# Patient Record
Sex: Male | Born: 1945
Health system: Southern US, Community
[De-identification: ages and names within clinical notes are randomized; demographics above are authoritative.]

## PROBLEM LIST (undated history)

## (undated) DIAGNOSIS — T4145XA Adverse effect of unspecified anesthetic, initial encounter: Secondary | ICD-10-CM

## (undated) DIAGNOSIS — N261 Atrophy of kidney (terminal): Secondary | ICD-10-CM

## (undated) DIAGNOSIS — E785 Hyperlipidemia, unspecified: Secondary | ICD-10-CM

## (undated) DIAGNOSIS — M1990 Unspecified osteoarthritis, unspecified site: Secondary | ICD-10-CM

## (undated) DIAGNOSIS — Z9289 Personal history of other medical treatment: Secondary | ICD-10-CM

## (undated) DIAGNOSIS — I350 Nonrheumatic aortic (valve) stenosis: Secondary | ICD-10-CM

## (undated) DIAGNOSIS — Z9989 Dependence on other enabling machines and devices: Secondary | ICD-10-CM

## (undated) DIAGNOSIS — N186 End stage renal disease: Secondary | ICD-10-CM

## (undated) DIAGNOSIS — I10 Essential (primary) hypertension: Secondary | ICD-10-CM

## (undated) DIAGNOSIS — T8859XA Other complications of anesthesia, initial encounter: Secondary | ICD-10-CM

## (undated) DIAGNOSIS — M869 Osteomyelitis, unspecified: Secondary | ICD-10-CM

## (undated) DIAGNOSIS — N493 Fournier gangrene: Secondary | ICD-10-CM

## (undated) DIAGNOSIS — R011 Cardiac murmur, unspecified: Secondary | ICD-10-CM

## (undated) DIAGNOSIS — G4733 Obstructive sleep apnea (adult) (pediatric): Secondary | ICD-10-CM

## (undated) DIAGNOSIS — I272 Pulmonary hypertension, unspecified: Secondary | ICD-10-CM

## (undated) DIAGNOSIS — N189 Chronic kidney disease, unspecified: Secondary | ICD-10-CM

## (undated) DIAGNOSIS — D649 Anemia, unspecified: Secondary | ICD-10-CM

## (undated) DIAGNOSIS — E559 Vitamin D deficiency, unspecified: Secondary | ICD-10-CM

## (undated) DIAGNOSIS — E669 Obesity, unspecified: Secondary | ICD-10-CM

## (undated) DIAGNOSIS — K922 Gastrointestinal hemorrhage, unspecified: Secondary | ICD-10-CM

## (undated) DIAGNOSIS — E119 Type 2 diabetes mellitus without complications: Secondary | ICD-10-CM

## (undated) DIAGNOSIS — I214 Non-ST elevation (NSTEMI) myocardial infarction: Secondary | ICD-10-CM

## (undated) HISTORY — PX: COLONOSCOPY: SHX174

## (undated) HISTORY — DX: Obstructive sleep apnea (adult) (pediatric): G47.33

## (undated) HISTORY — DX: Chronic kidney disease, unspecified: N18.9

## (undated) HISTORY — DX: Type 2 diabetes mellitus without complications: E11.9

## (undated) HISTORY — DX: Personal history of other medical treatment: Z92.89

## (undated) HISTORY — DX: Fournier gangrene: N49.3

## (undated) HISTORY — DX: Essential (primary) hypertension: I10

## (undated) HISTORY — DX: Other complications of anesthesia, initial encounter: T88.59XA

## (undated) HISTORY — DX: Dependence on other enabling machines and devices: Z99.89

## (undated) HISTORY — DX: Anemia, unspecified: D64.9

## (undated) HISTORY — DX: Vitamin D deficiency, unspecified: E55.9

## (undated) HISTORY — DX: Adverse effect of unspecified anesthetic, initial encounter: T41.45XA

## (undated) HISTORY — PX: COLOSTOMY REVERSAL: SHX5782

## (undated) HISTORY — DX: Hyperlipidemia, unspecified: E78.5

## (undated) HISTORY — DX: Atrophy of kidney (terminal): N26.1

## (undated) HISTORY — DX: Osteomyelitis, unspecified: M86.9

---
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
## 2012-05-22 DIAGNOSIS — E875 Hyperkalemia: Secondary | ICD-10-CM | POA: Insufficient documentation

## 2014-11-15 DIAGNOSIS — M869 Osteomyelitis, unspecified: Secondary | ICD-10-CM

## 2014-11-15 HISTORY — PX: TOE AMPUTATION: SHX809

## 2014-11-15 HISTORY — DX: Osteomyelitis, unspecified: M86.9

## 2015-03-16 HISTORY — PX: COLOSTOMY: SHX63

## 2015-04-08 DIAGNOSIS — E118 Type 2 diabetes mellitus with unspecified complications: Secondary | ICD-10-CM | POA: Insufficient documentation

## 2015-04-08 DIAGNOSIS — I10 Essential (primary) hypertension: Secondary | ICD-10-CM | POA: Insufficient documentation

## 2015-04-08 DIAGNOSIS — E871 Hypo-osmolality and hyponatremia: Secondary | ICD-10-CM | POA: Insufficient documentation

## 2015-04-09 DIAGNOSIS — N184 Chronic kidney disease, stage 4 (severe): Secondary | ICD-10-CM | POA: Insufficient documentation

## 2015-04-13 DIAGNOSIS — N493 Fournier gangrene: Secondary | ICD-10-CM | POA: Insufficient documentation

## 2015-07-01 DIAGNOSIS — M86679 Other chronic osteomyelitis, unspecified ankle and foot: Secondary | ICD-10-CM | POA: Insufficient documentation

## 2016-04-26 DIAGNOSIS — E559 Vitamin D deficiency, unspecified: Secondary | ICD-10-CM | POA: Insufficient documentation

## 2016-09-05 DIAGNOSIS — Z933 Colostomy status: Secondary | ICD-10-CM | POA: Insufficient documentation

## 2018-09-09 DIAGNOSIS — D509 Iron deficiency anemia, unspecified: Secondary | ICD-10-CM | POA: Insufficient documentation

## 2018-12-18 DIAGNOSIS — I350 Nonrheumatic aortic (valve) stenosis: Secondary | ICD-10-CM | POA: Insufficient documentation

## 2019-02-28 ENCOUNTER — Ambulatory Visit (INDEPENDENT_AMBULATORY_CARE_PROVIDER_SITE_OTHER): Payer: Medicare Other | Admitting: Internal Medicine

## 2019-02-28 ENCOUNTER — Other Ambulatory Visit: Payer: Self-pay

## 2019-02-28 ENCOUNTER — Encounter: Payer: Self-pay | Admitting: Internal Medicine

## 2019-02-28 DIAGNOSIS — N184 Chronic kidney disease, stage 4 (severe): Secondary | ICD-10-CM | POA: Diagnosis not present

## 2019-02-28 DIAGNOSIS — I350 Nonrheumatic aortic (valve) stenosis: Secondary | ICD-10-CM | POA: Diagnosis not present

## 2019-02-28 DIAGNOSIS — E118 Type 2 diabetes mellitus with unspecified complications: Secondary | ICD-10-CM

## 2019-02-28 DIAGNOSIS — I10 Essential (primary) hypertension: Secondary | ICD-10-CM

## 2019-02-28 DIAGNOSIS — E875 Hyperkalemia: Secondary | ICD-10-CM

## 2019-02-28 DIAGNOSIS — E559 Vitamin D deficiency, unspecified: Secondary | ICD-10-CM

## 2019-02-28 NOTE — Progress Notes (Signed)
Subjective:    Patient ID: Louis Bradley, male    DOB: September 08, 1946, 73 y.o.   MRN: [id number]  DOS:  02/28/2019 Type of visit - description: Virtual Visit via Video Note  I connected with@ on 03/01/19 at  2:00 PM EDT by a video enabled telemedicine application and verified that I am speaking with the correct person using two identifiers.   THIS ENCOUNTER IS A VIRTUAL VISIT DUE TO COVID-19 - PATIENT WAS NOT SEEN IN THE OFFICE. PATIENT HAS CONSENTED TO VIRTUAL VISIT / TELEMEDICINE VISIT   Location of patient: home  Location of provider: office  I discussed the limitations of evaluation and management by telemedicine and the availability of in person appointments. The patient expressed understanding and agreed to proceed.  History of Present Illness: New patient In general feels well.  Good compliance with medication. Reviewed the notes from cardiology, hematology, nephrology Reviewed available labs  Review of Systems Denies any fever chills No chest pain no difficulty breathing No nausea, vomiting, diarrhea. No lower extremity edema  Past Medical History:  Diagnosis Date  . Anemia   . Anesthesia complication    General anesthesia makes me feel foggy for long periods of time  . CKD (chronic kidney disease)   . Diabetes ([place])   . Fournier gangrene   . History of blood transfusion    x2  . Hyperlipidemia   . Hypertension   . OSA on CPAP   . Osteomyelitis ([place]) 2016   L toe  . Renal atrophy, left   . Vitamin D deficiency     Past Surgical History:  Procedure Laterality Date  . COLOSTOMY  03/2015  . TOE AMPUTATION Left 2016    Social History   Socioeconomic History  . Marital status: Married    Spouse name: Not on file  . Number of children: 2  . Years of education: Not on file  . Highest education level: Not on file  Occupational History  . Occupation: retired-owned a Environmental manager   . Occupation: retired- Theatre manager  Social Needs  . Financial resource  strain: Not on file  . Food insecurity:    Worry: Not on file    Inability: Not on file  . Transportation needs:    Medical: Not on file    Non-medical: Not on file  Tobacco Use  . Smoking status: Former Research scientist (life sciences)  . Smokeless tobacco: Never Used  . Tobacco comment: quit 12/2018 (1 ppd)  Substance and Sexual Activity  . Alcohol use: Yes    Comment: social  . Drug use: Not on file  . Sexual activity: Not on file  Lifestyle  . Physical activity:    Days per week: Not on file    Minutes per session: Not on file  . Stress: Not on file  Relationships  . Social connections:    Talks on phone: Not on file    Gets together: Not on file    Attends religious service: Not on file    Active member of club or organization: Not on file    Attends meetings of clubs or organizations: Not on file    Relationship status: Not on file  . Intimate partner violence:    Fear of current or ex partner: Not on file    Emotionally abused: Not on file    Physically abused: Not on file    Forced sexual activity: Not on file  Other Topics Concern  . Not on file  Social History Narrative  Patient and his wife moved from [place] on 02/14/2021 live with his daughter     Family History  Problem Relation Age of Onset  . CAD Father 52  . CAD Other        2 uncles had heart attacks  . Colon cancer Neg Hx   . Prostate cancer Neg Hx      Allergies as of 02/28/2019   No Known Allergies     Medication List       Accurate as of February 28, 2019 11:59 PM. Always use your most recent med list.        amLODipine 10 MG tablet Commonly known as:  NORVASC Take 10 mg by mouth daily.   aspirin 325 MG tablet Take 325 mg by mouth daily.   carvedilol 25 MG tablet Commonly known as:  COREG Take 25 mg by mouth 2 (two) times daily with a meal.   furosemide 40 MG tablet Commonly known as:  LASIX Take 80 mg by mouth daily.   glipiZIDE 5 MG tablet Commonly known as:  GLUCOTROL Take 10 mg by mouth 2 (two)  times daily.   pravastatin 40 MG tablet Commonly known as:  PRAVACHOL Take 40 mg by mouth at bedtime.   PROBIOTIC DAILY PO Take 1 tablet by mouth daily.           Objective:   Physical Exam There were no vitals taken for this visit. This is a video visit, the patient was alert oriented x3, no apparent distress    Assessment    Assessment (new patient/2020, referred by his daughter) DM HTN Hyperlipidemia CKD: HD fistula planned ~ 11/2018, cancelled Vitamin D deficiency Aortic valve stenosis: Next echo 2020 OSA, on CPAP H/o osteomyelitis status post left toe amputation H/o gangrene, buttocks, had temporarily a colostomy Former smoker, quit 12-2018 Anemia: Seen at the ER with fatigue, hemoglobin of 8.6 08-2018: 2 PRBCs, had a EGD and colonoscopy   PLAN: DM: Currently on Glucotrol, ambulatory CBGs nightly, 120 in the mornings.  A1c 5.9 on 09/09/2018. We will check a A1c at the next opportunity HTN: Currently on amlodipine, carvedilol, Lasix.  Ambulatory BPs 120, 140.  Diastolic BPs 87O.  Labs at the next opportunity CKD: Hemodialysis fistula was planned for 11-2018 but eventually cancel Last office visit with nephrology 12/21/2018.  They felt the patient was a stable with no uremic symptoms. Labs from 11/30/2018: Sodium 136, potassium 5.7, creatinine 3.8, hemoglobin 13.2. Labs from 12/22/2018: Sodium 133, potassium 5.3, creatinine 3.3. Calcium 9.5, phosphorus 6.4, Vitamin D 18 Hemoglobin 12.6, iron levels normal Plan: Refer to our local nephrologist. In the meantime, we will check a BMP next week to be sure he has no hyperkalemia OSA: Reports good compliance with CPAP Coronavirus prevention: The patient and his wife are practicing excellent hygiene as recommended by the CDC Aortic valve stenosis: Last cardiology visit 12/18/2018, described as moderate, will need a due for an echocardiogram this year 2020. We will refer to cardiology when he comes back. Severe anemia: Was seen  at the emergency room 09/08/2018 with fatigue, hemoglobin was 6.8, GI ROS negative, had no previous colonoscopy or EGD.  Had 2 PRBCs, 3 doses of Injectafer Had a EGD and colonoscopy December 2019, per patient for polyps, repeat in 5 years. Return to the office: 6 weeks, my staff will call and set up an appointment, in the meantime he will call if he has problems or needs a refill.   Today, I spent more than  35 min with the patient: >50% of the time counseling regards his multiple medical problems, doing extensive chart review to get familiar with the case.  Also coordinating his care.   I discussed the assessment and treatment plan with the patient. The patient was provided an opportunity to ask questions and all were answered. The patient agreed with the plan and demonstrated an understanding of the instructions.   The patient was advised to call back or seek an in-person evaluation if the symptoms worsen or if the condition fails to improve as anticipated.

## 2019-03-01 DIAGNOSIS — Z09 Encounter for follow-up examination after completed treatment for conditions other than malignant neoplasm: Secondary | ICD-10-CM | POA: Insufficient documentation

## 2019-03-01 NOTE — Assessment & Plan Note (Signed)
  DM: Currently on Glucotrol, ambulatory CBGs nightly, 120 in the mornings.  A1c 5.9 on 09/09/2018. We will check a A1c at the next opportunity HTN: Currently on amlodipine, carvedilol, Lasix.  Ambulatory BPs 120, 140.  Diastolic BPs 54H.  Labs at the next opportunity CKD: Hemodialysis fistula was planned for 11-2018 but eventually cancel Last office visit with nephrology 12/21/2018.  They felt the patient was a stable with no uremic symptoms. Labs from 11/30/2018: Sodium 136, potassium 5.7, creatinine 3.8, hemoglobin 13.2. Labs from 12/22/2018: Sodium 133, potassium 5.3, creatinine 3.3. Calcium 9.5, phosphorus 6.4, Vitamin D 18 Hemoglobin 12.6, iron levels normal Plan: Refer to our local nephrologist. In the meantime, we will check a BMP next week to be sure he has no hyperkalemia OSA: Reports good compliance with CPAP Coronavirus prevention: The patient and his wife are practicing excellent hygiene as recommended by the CDC Aortic valve stenosis: Last cardiology visit 12/18/2018, described as moderate, will need a due for an echocardiogram this year 2020. We will refer to cardiology when he comes back. Severe anemia: Was seen at the emergency room 09/08/2018 with fatigue, hemoglobin was 6.8, GI ROS negative, had no previous colonoscopy or EGD.  Had 2 PRBCs, 3 doses of Injectafer Had a EGD and colonoscopy December 2019, per patient for polyps, repeat in 5 years. Return to the office: 6 weeks, my staff will call and set up an appointment, in the meantime he will call if he has problems or needs a refill.

## 2019-03-02 NOTE — Addendum Note (Signed)
Addended by: Hinton Dyer on: 03/02/2019 08:11 AM   Modules accepted: Orders

## 2019-03-02 NOTE — Addendum Note (Signed)
Addended by: Hinton Dyer on: 03/02/2019 08:02 AM   Modules accepted: Orders

## 2019-03-07 ENCOUNTER — Other Ambulatory Visit: Payer: Self-pay | Admitting: Internal Medicine

## 2019-03-07 ENCOUNTER — Other Ambulatory Visit: Payer: Self-pay

## 2019-03-07 ENCOUNTER — Telehealth: Payer: Self-pay | Admitting: Internal Medicine

## 2019-03-07 ENCOUNTER — Other Ambulatory Visit (INDEPENDENT_AMBULATORY_CARE_PROVIDER_SITE_OTHER): Payer: Medicare Other

## 2019-03-07 DIAGNOSIS — E875 Hyperkalemia: Secondary | ICD-10-CM

## 2019-03-07 DIAGNOSIS — N186 End stage renal disease: Secondary | ICD-10-CM

## 2019-03-07 DIAGNOSIS — N184 Chronic kidney disease, stage 4 (severe): Secondary | ICD-10-CM

## 2019-03-07 LAB — BASIC METABOLIC PANEL
BUN: 83 mg/dL — ABNORMAL HIGH (ref 6–23)
CO2: 24 mEq/L (ref 19–32)
Calcium: 8.4 mg/dL (ref 8.4–10.5)
Chloride: 104 mEq/L (ref 96–112)
Creatinine, Ser: 4.5 mg/dL (ref 0.40–1.50)
GFR: 12.9 mL/min — CL (ref >60.00)
Glucose, Bld: 133 mg/dL — ABNORMAL HIGH (ref 70–99)
Potassium: 5.7 mEq/L — ABNORMAL HIGH (ref 3.5–5.1)
Sodium: 137 mEq/L (ref 135–145)

## 2019-03-07 MED ORDER — SODIUM ZIRCONIUM CYCLOSILICATE 10 G PO PACK: 10.0000 g | PACK | Freq: Every day | ORAL | 0 refills | Status: DC

## 2019-03-07 NOTE — Telephone Encounter (Signed)
Louis Medina, RN with Access Nurse called to report critical lab results from [facility]: Creatinine 4.5 Normal 0.4-1.5 GFR 12.90, Normal >60  She asks will I give the results to the doctor, I advise he office is closed and the results will be sent to the office for the provider to receive tomorrow. She says she has to report it to a doctor and says the on call provider Dr. Sharlet Salina doesn't go on until 1700, so she will make a note she spoke to me and will call Dr. Sharlet Salina at 1700 to give her the critical as well.

## 2019-03-07 NOTE — Telephone Encounter (Signed)
Called patient and left message to take extra 40 mg tablet lasix tonight for critical lab with K 5.7. Advised also to come back to lab tomorrow for recheck. BMP placed for tomorrow.

## 2019-03-08 NOTE — Telephone Encounter (Signed)
Noted, see comments under results

## 2019-03-08 NOTE — Addendum Note (Signed)
Addended by: Hinton Dyer on: 03/08/2019 09:39 AM   Modules accepted: Orders

## 2019-03-12 ENCOUNTER — Telehealth: Payer: Self-pay

## 2019-03-12 NOTE — Telephone Encounter (Signed)
Tier exception form for [facility] received from [facility] for Pt- form completed and faxed to [phone]. Awaiting determination.

## 2019-03-12 NOTE — Telephone Encounter (Signed)
Tier exception denied. [facility] not eligible for tier reduction.

## 2019-03-13 ENCOUNTER — Other Ambulatory Visit: Payer: Self-pay

## 2019-03-13 MED ORDER — FUROSEMIDE 40 MG PO TABS: 80.0000 mg | ORAL_TABLET | Freq: Every day | ORAL | 1 refills | Status: DC

## 2019-03-13 MED ORDER — GLIPIZIDE 5 MG PO TABS: 10.0000 mg | ORAL_TABLET | Freq: Two times a day (BID) | ORAL | 1 refills | Status: DC

## 2019-03-13 MED ORDER — PRAVASTATIN SODIUM 40 MG PO TABS: 40.0000 mg | ORAL_TABLET | Freq: Every day | ORAL | 1 refills | Status: DC

## 2019-03-13 MED ORDER — AMLODIPINE BESYLATE 10 MG PO TABS: 10.0000 mg | ORAL_TABLET | Freq: Every day | ORAL | 1 refills | Status: DC

## 2019-03-13 MED ORDER — CARVEDILOL 25 MG PO TABS: 25.0000 mg | ORAL_TABLET | Freq: Two times a day (BID) | ORAL | 1 refills | Status: DC

## 2019-03-13 NOTE — Telephone Encounter (Addendum)
I am concerned about the patient not able to get [facility], call at his insurance company, "[employer]", I specifically told the staff member that he has hyperkalemia and this is potentially a life-threatening condition. Later on the call,  I was informed that he has a deductible to meet, after that cost would be approximately $47.  There is nothing they can do about it. I called [facility] to talk with the referral coordinator as patient needs to be seen ASAP.  No answer Called the patient: No answer

## 2019-03-13 NOTE — Telephone Encounter (Signed)
Per Herbie [place] at [facility] has a deductible he must meet, then [facility] is $47.00. Pt can call them to see if he qualifies for assistance at [phone] option 2.

## 2019-03-13 NOTE — Telephone Encounter (Signed)
Refill request received from [facility] for glipizide, amlodipine, pravastatin, furosemide, and carvedilol. Prescriptions sent for 90 day supplies and 1 refill.

## 2019-03-13 NOTE — Telephone Encounter (Signed)
I talked with Louis Bradley, he has an appointment 03/19/2019 at 2:30 PM. Also, patient was able to get [facility] and is taking it daily. Advised patient that he does not need to come next week for labs since he is going to be seen by nephrology.  He will call me when needed.

## 2019-03-19 LAB — BASIC METABOLIC PANEL
BUN: 59 — AB (ref 4–21)
Creatinine: 4 — AB (ref 0.6–1.3)
Glucose: 88
Potassium: 5.1 (ref 3.4–5.3)
Sodium: 136 — AB (ref 137–147)

## 2019-03-19 LAB — IRON,TIBC AND FERRITIN PANEL
%SAT: 26
Ferritin: 224
Iron: 74
TIBC: 289
UIBC: 215

## 2019-03-19 LAB — CBC AND DIFFERENTIAL
HCT: 35 — AB (ref 41–53)
Hemoglobin: 11.5 — AB (ref 13.5–17.5)
Neutrophils Absolute: 6
Platelets: 173 (ref 150–399)
WBC: 8.7

## 2019-03-20 ENCOUNTER — Other Ambulatory Visit: Payer: Medicare Other

## 2019-03-26 ENCOUNTER — Encounter: Payer: Self-pay | Admitting: Internal Medicine

## 2019-04-02 ENCOUNTER — Other Ambulatory Visit: Payer: Self-pay

## 2019-04-02 DIAGNOSIS — N186 End stage renal disease: Secondary | ICD-10-CM

## 2019-04-03 ENCOUNTER — Telehealth (HOSPITAL_COMMUNITY): Payer: Self-pay | Admitting: Rehabilitation

## 2019-04-03 NOTE — Telephone Encounter (Signed)
The above patient or their representative was contacted and gave the following answers to these questions:         Do you have any of the following symptoms? No  Fever                    Cough                   Shortness of breath  Do  you have any of the following other symptoms? No   muscle pain         vomiting,        diarrhea        rash         weakness        red eye        abdominal pain         bruising          bruising or bleeding              joint pain           severe headache    Have you been in contact with someone who was or has been sick in the past 2 weeks? No  Yes                 Unsure                         Unable to assess   Does the person that you were in contact with have any of the following symptoms?   Cough         shortness of breath           muscle pain         vomiting,            diarrhea            rash            weakness           fever            red eye           abdominal pain           bruising  or  bleeding                joint pain                severe headache               Have you  or someone you have been in contact with traveled internationally in th last month? No        If yes, which countries?   Have you  or someone you have been in contact with traveled outside [place] in th last month?  No       If yes, which state and city?   COMMENTS OR ACTION PLAN FOR THIS PATIENT:  Pt instructed to wear mask to appt and come alone.

## 2019-04-03 NOTE — Telephone Encounter (Signed)
Patient's wife informed me that they did recently move to [place] from a small town close to [place], [place] approximately one month ago. All screening questions were answered with "No".

## 2019-04-04 ENCOUNTER — Encounter: Payer: Self-pay | Admitting: Vascular Surgery

## 2019-04-04 ENCOUNTER — Ambulatory Visit (HOSPITAL_COMMUNITY)
Admission: RE | Admit: 2019-04-04 | Discharge: 2019-04-04 | Disposition: A | Discharge status: Home / Self Care | Payer: Medicare Other | Source: Ambulatory Visit | Attending: Vascular Surgery | Admitting: Vascular Surgery

## 2019-04-04 ENCOUNTER — Ambulatory Visit (INDEPENDENT_AMBULATORY_CARE_PROVIDER_SITE_OTHER): Payer: Medicare Other | Admitting: Vascular Surgery

## 2019-04-04 ENCOUNTER — Ambulatory Visit (INDEPENDENT_AMBULATORY_CARE_PROVIDER_SITE_OTHER)
Admission: RE | Admit: 2019-04-04 | Discharge: 2019-04-04 | Disposition: A | Discharge status: Home / Self Care | Payer: Medicare Other | Source: Ambulatory Visit | Attending: Vascular Surgery | Admitting: Vascular Surgery

## 2019-04-04 ENCOUNTER — Encounter: Payer: Self-pay | Admitting: *Deleted

## 2019-04-04 ENCOUNTER — Other Ambulatory Visit: Payer: Self-pay

## 2019-04-04 ENCOUNTER — Other Ambulatory Visit: Payer: Self-pay | Admitting: *Deleted

## 2019-04-04 VITALS — BP 140/71 | HR 55 | Temp 97.2°F | Resp 20 | Ht 75.0 in | Wt 307.0 lb

## 2019-04-04 DIAGNOSIS — N186 End stage renal disease: Secondary | ICD-10-CM | POA: Diagnosis not present

## 2019-04-04 DIAGNOSIS — N185 Chronic kidney disease, stage 5: Secondary | ICD-10-CM

## 2019-04-04 NOTE — Progress Notes (Signed)
REASON FOR CONSULT:    To evaluate for hemodialysis access.  The consult is requested by [facility]  ASSESSMENT & PLAN:   STAGE V CHRONIC KIDNEY DISEASE: This patient has stage V chronic kidney disease and we have have been asked to place hemodialysis access.  Based on his vein map looks like he might be a candidate for a left upper arm brachiocephalic fistula or potentially a basilic vein transposition.  I have explained that if we do a basilic vein transposition this could potentially be done in 1 or 2 stages depending upon the size of the vein.  If the vein is small and we oftentimes do it in 2 stages.  If the vein looks adequate we will sometimes do it all at once.  If neither were adequate then he would require placement of an AV graft.  I have explained the indications for placement of an AV fistula or AV graft. I've explained that if at all possible we will place an AV fistula.  I have reviewed the risks of placement of an AV fistula including but not limited to: failure of the fistula to mature, need for subsequent interventions, and thrombosis. In addition I have reviewed the potential complications of placement of an AV graft. These risks include, but are not limited to, graft thrombosis, graft infection, wound healing problems, bleeding, arm swelling, and steal syndrome. All the patient's questions were answered and they are agreeable to proceed with surgery.  Deitra Mayo, MD, FACS Beeper [phone] Office: [phone]   HPI:   Louis Bradley is a pleasant 73 y.o. male, who recently moved here from [place].  He has stage V chronic kidney disease.  He was apparently scheduled to have surgery there but his appointment was canceled because the surgeon had an emergency.  Things got delayed and ultimately he decided to reschedule once he got to [place].  The patient has a single kidney apparently since birth.  He is not sure of the etiology of his chronic kidney  disease.  I do not have the records from the referring office.  The patient denies any recent uremic symptoms.  Specifically, he denies nausea, vomiting, fatigue, anorexia, or palpitations.  He does have type 2 diabetes and hypertension.  He denies any history of myocardial infarction or history of congestive heart failure.  He has a remote history of tobacco use.  He is right-handed.  He has not had a pacemaker or AICD.  He has had no previous catheters.  Past Medical History:  Diagnosis Date  . Anemia   . Anesthesia complication    General anesthesia makes me feel foggy for long periods of time  . CKD (chronic kidney disease)   . Diabetes ([place])   . Fournier gangrene   . History of blood transfusion    x2  . Hyperlipidemia   . Hypertension   . OSA on CPAP   . Osteomyelitis ([place]) 2016   L toe  . Renal atrophy, left   . Vitamin D deficiency     Family History  Problem Relation Age of Onset  . CAD Father 81  . CAD Other        2 uncles had heart attacks  . Colon cancer Neg Hx   . Prostate cancer Neg Hx     SOCIAL HISTORY: Social History   Socioeconomic History  . Marital status: Married    Spouse name: Not on file  . Number of children: 2  .  Years of education: Not on file  . Highest education level: Not on file  Occupational History  . Occupation: retired-owned a Environmental manager   . Occupation: retired- Theatre manager  Social Needs  . Financial resource strain: Not on file  . Food insecurity:    Worry: Not on file    Inability: Not on file  . Transportation needs:    Medical: Not on file    Non-medical: Not on file  Tobacco Use  . Smoking status: Former Research scientist (life sciences)  . Smokeless tobacco: Never Used  . Tobacco comment: quit 12/2018 (1 ppd)  Substance and Sexual Activity  . Alcohol use: Yes    Comment: social  . Drug use: Never  . Sexual activity: Not on file  Lifestyle  . Physical activity:    Days per week: Not on file    Minutes per session: Not on file  .  Stress: Not on file  Relationships  . Social connections:    Talks on phone: Not on file    Gets together: Not on file    Attends religious service: Not on file    Active member of club or organization: Not on file    Attends meetings of clubs or organizations: Not on file    Relationship status: Not on file  . Intimate partner violence:    Fear of current or ex partner: Not on file    Emotionally abused: Not on file    Physically abused: Not on file    Forced sexual activity: Not on file  Other Topics Concern  . Not on file  Social History Narrative   Patient and his wife moved from [place] on 02/14/2021 live with his daughter    No Known Allergies  Current Outpatient Medications  Medication Sig Dispense Refill  . amLODipine (NORVASC) 10 MG tablet Take 1 tablet (10 mg total) by mouth daily. 90 tablet 1  . aspirin 325 MG tablet Take 325 mg by mouth daily.    . calcitRIOL (ROCALTROL) 0.25 MCG capsule TAKE 1 CAPSULE BY MOUTH THREE TIMES A WEEK    . carvedilol (COREG) 25 MG tablet Take 1 tablet (25 mg total) by mouth 2 (two) times daily with a meal. 180 tablet 1  . furosemide (LASIX) 40 MG tablet Take 2 tablets (80 mg total) by mouth daily. 180 tablet 1  . glipiZIDE (GLUCOTROL) 5 MG tablet Take 2 tablets (10 mg total) by mouth 2 (two) times daily. 180 tablet 1  . pravastatin (PRAVACHOL) 40 MG tablet Take 1 tablet (40 mg total) by mouth at bedtime. 90 tablet 1  . Probiotic Product (PROBIOTIC DAILY PO) Take 1 tablet by mouth daily.    . sodium bicarbonate 650 MG tablet Take 650 mg by mouth daily.    . sodium zirconium cyclosilicate (LOKELMA) 10 g PACK packet Take 10 g by mouth daily. 30 packet 0   No current facility-administered medications for this visit.     REVIEW OF SYSTEMS:  [X]  denotes positive finding, [ ]  denotes negative finding Cardiac  Comments:  Chest pain or chest pressure:    Shortness of breath upon exertion:    Short of breath when lying flat:    Irregular heart  rhythm:        Vascular    Pain in calf, thigh, or hip brought on by ambulation:    Pain in feet at night that wakes you up from your sleep:     Blood clot in your veins:  Leg swelling:         Pulmonary    Oxygen at home:    Productive cough:     Wheezing:         Neurologic    Sudden weakness in arms or legs:     Sudden numbness in arms or legs:     Sudden onset of difficulty speaking or slurred speech:    Temporary loss of vision in one eye:     Problems with dizziness:         Gastrointestinal    Blood in stool:     Vomited blood:         Genitourinary    Burning when urinating:     Blood in urine:        Psychiatric    Major depression:         Hematologic    Bleeding problems:    Problems with blood clotting too easily:        Skin    Rashes or ulcers:        Constitutional    Fever or chills:     PHYSICAL EXAM:   Vitals:   04/04/19 1318  BP: 140/71  Pulse: (!) 55  Resp: 20  Temp: (!) 97.2 F (36.2 C)  SpO2: 95%  Weight: (!) 307 lb (139.3 kg)  Height: 6\' 3"  (1.905 m)   GENERAL: The patient is a well-nourished male, in no acute distress. The vital signs are documented above. CARDIAC: There is a regular rate and rhythm.  VASCULAR: I do not detect carotid bruits. He has palpable radial pulses bilaterally. I do not see any usable surface veins on either side. PULMONARY: There is good air exchange bilaterally without wheezing or rales. ABDOMEN: Soft and non-tender with normal pitched bowel sounds.  MUSCULOSKELETAL: There are no major deformities or cyanosis. NEUROLOGIC: No focal weakness or paresthesias are detected. SKIN: There are no ulcers or rashes noted. PSYCHIATRIC: The patient has a normal affect.  DATA:    LABS: I reviewed his labs from 03/19/2019.  He had a creatinine of 4.0.  His GFR was 13.  UPPER EXTREMITY ARTERIAL DUPLEX: I have independently interpreted his upper extremity arterial duplex scan today.  On the right side there is a  triphasic radial and ulnar Doppler signal.  The brachial artery measures 0.63 cm in diameter.  On the left side there is a triphasic radial and ulnar Doppler signal.  The brachial artery measures 0.58 cm in diameter.  UPPER EXTREMITY VEIN MAP: I have independently interpreted their upper extremity vein map.  On the left side, the forearm cephalic vein looks small.  The upper arm cephalic vein is somewhat marginal in size but might potentially be usable.  The basilic vein looks reasonable in size.

## 2019-04-12 ENCOUNTER — Ambulatory Visit (HOSPITAL_BASED_OUTPATIENT_CLINIC_OR_DEPARTMENT_OTHER)
Admission: RE | Admit: 2019-04-12 | Discharge: 2019-04-12 | Disposition: A | Discharge status: Home / Self Care | Payer: Medicare Other | Source: Ambulatory Visit | Attending: Internal Medicine | Admitting: Internal Medicine

## 2019-04-12 ENCOUNTER — Ambulatory Visit (INDEPENDENT_AMBULATORY_CARE_PROVIDER_SITE_OTHER): Payer: Medicare Other | Admitting: Internal Medicine

## 2019-04-12 ENCOUNTER — Other Ambulatory Visit: Payer: Self-pay

## 2019-04-12 ENCOUNTER — Encounter: Payer: Self-pay | Admitting: Internal Medicine

## 2019-04-12 ENCOUNTER — Telehealth: Payer: Self-pay

## 2019-04-12 DIAGNOSIS — M869 Osteomyelitis, unspecified: Secondary | ICD-10-CM

## 2019-04-12 DIAGNOSIS — Z8614 Personal history of Methicillin resistant Staphylococcus aureus infection: Secondary | ICD-10-CM | POA: Diagnosis present

## 2019-04-12 DIAGNOSIS — E118 Type 2 diabetes mellitus with unspecified complications: Secondary | ICD-10-CM | POA: Diagnosis present

## 2019-04-12 DIAGNOSIS — L089 Local infection of the skin and subcutaneous tissue, unspecified: Secondary | ICD-10-CM | POA: Diagnosis not present

## 2019-04-12 MED ORDER — DOXYCYCLINE HYCLATE 100 MG PO TABS: 100.0000 mg | ORAL_TABLET | Freq: Two times a day (BID) | ORAL | 0 refills | Status: DC

## 2019-04-12 MED ORDER — MUPIROCIN 2 % EX OINT: 1.0000 "application " | TOPICAL_OINTMENT | Freq: Two times a day (BID) | CUTANEOUS | 0 refills | Status: DC

## 2019-04-12 NOTE — Telephone Encounter (Signed)
Copied from [place] [id number]. Topic: General - Other >> Apr 12, 2019  9:02 AM Keene Breath wrote: Reason for CRM: Patient called to inform the doctor that his kidney doctor told the patient that it is ok to take doxycycline 100 mg 2x daily.  Patient stated that Dr. Larose Kells wanted his to check before he started taking it.  Please advise and call patient back at [phone]

## 2019-04-12 NOTE — Progress Notes (Signed)
Subjective:    Patient ID: Louis Bradley, male    DOB: 02-20-1946, 73 y.o.   MRN: [id number]  DOS:  04/12/2019 Type of visit - description: Virtual Visit via Video Note  I connected with@ on 04/12/19 at  8:40 AM EDT by a video enabled telemedicine application and verified that I am speaking with the correct person using two identifiers.   THIS ENCOUNTER IS A VIRTUAL VISIT DUE TO COVID-19 - PATIENT WAS NOT SEEN IN THE OFFICE. PATIENT HAS CONSENTED TO VIRTUAL VISIT / TELEMEDICINE VISIT   Location of patient: home  Location of provider: office  I discussed the limitations of evaluation and management by telemedicine and the availability of in person appointments. The patient expressed understanding and agreed to proceed.  History of Present Illness: Acute visit, here with his wife who helps with history taking Noted 2 sores on the L toes yesterday, does not know  when they happen. Toes are swollen, slightly red and warm.  No discharge.  No pain.  He does not recall blisters there. Medication list reviewed, good compliance     Review of Systems No fever chills  Past Medical History:  Diagnosis Date  . Anemia   . Anesthesia complication    General anesthesia makes me feel foggy for long periods of time  . CKD (chronic kidney disease)   . Diabetes ([place])   . Fournier gangrene   . History of blood transfusion    x2  . Hyperlipidemia   . Hypertension   . OSA on CPAP   . Osteomyelitis ([employer]) 2016   L toe  . Renal atrophy, left   . Vitamin D deficiency     Past Surgical History:  Procedure Laterality Date  . COLOSTOMY  03/2015  . TOE AMPUTATION Left 2016    Social History   Socioeconomic History  . Marital status: Married    Spouse name: Not on file  . Number of children: 2  . Years of education: Not on file  . Highest education level: Not on file  Occupational History  . Occupation: retired-owned a Environmental manager   . Occupation: retired- Theatre manager  Social Needs   . Financial resource strain: Not on file  . Food insecurity:    Worry: Not on file    Inability: Not on file  . Transportation needs:    Medical: Not on file    Non-medical: Not on file  Tobacco Use  . Smoking status: Former Research scientist (life sciences)  . Smokeless tobacco: Never Used  . Tobacco comment: quit 12/2018 (1 ppd)  Substance and Sexual Activity  . Alcohol use: Yes    Comment: social  . Drug use: Never  . Sexual activity: Not on file  Lifestyle  . Physical activity:    Days per week: Not on file    Minutes per session: Not on file  . Stress: Not on file  Relationships  . Social connections:    Talks on phone: Not on file    Gets together: Not on file    Attends religious service: Not on file    Active member of club or organization: Not on file    Attends meetings of clubs or organizations: Not on file    Relationship status: Not on file  . Intimate partner violence:    Fear of current or ex partner: Not on file    Emotionally abused: Not on file    Physically abused: Not on file    Forced sexual activity:  Not on file  Other Topics Concern  . Not on file  Social History Narrative   Patient and his wife moved from [place] on 02/14/2021 live with his daughter      Allergies as of 04/12/2019   No Known Allergies     Medication List       Accurate as of Apr 12, 2019  8:39 AM. If you have any questions, ask your nurse or doctor.        amLODipine 10 MG tablet Commonly known as:  NORVASC Take 1 tablet (10 mg total) by mouth daily.   aspirin 325 MG tablet Take 325 mg by mouth daily.   calcitRIOL 0.25 MCG capsule Commonly known as:  ROCALTROL TAKE 1 CAPSULE BY MOUTH THREE TIMES A WEEK   carvedilol 25 MG tablet Commonly known as:  COREG Take 1 tablet (25 mg total) by mouth 2 (two) times daily with a meal.   furosemide 40 MG tablet Commonly known as:  LASIX Take 2 tablets (80 mg total) by mouth daily.   glipiZIDE 5 MG tablet Commonly known as:  GLUCOTROL Take 2 tablets  (10 mg total) by mouth 2 (two) times daily.   pravastatin 40 MG tablet Commonly known as:  PRAVACHOL Take 1 tablet (40 mg total) by mouth at bedtime.   PROBIOTIC DAILY PO Take 1 tablet by mouth daily.   sodium bicarbonate 650 MG tablet Take 650 mg by mouth daily.   sodium zirconium cyclosilicate 10 g Pack packet Commonly known as:  Lokelma Take 10 g by mouth daily.           Objective:   Physical Exam There were no vitals taken for this visit. This is a virtual video visit.  Patient is alert oriented x3, no apparent distress I was able to see the plantar side of the second and third toes on the left, distally he has superficial wounds.  Mild swelling and minimal redness noted on the second toe. He has a surgical absent left big toe.    Assessment     Assessment (new patient/2020, referred by his daughter) DM HTN Hyperlipidemia CKD: HD fistula planned ~ 11/2018, cancelled Vitamin D deficiency Aortic valve stenosis: Next echo 2020 OSA, on CPAP H/o osteomyelitis status post left toe amputation H/o gangrene, buttocks, had temporarily a colostomy Former smoker, quit 12-2018 Anemia: Seen at the ER with fatigue, hemoglobin of 8.6 08-2018: 2 PRBCs, had a EGD and colonoscopy   PLAN: Toe infection x2: The patient is diabetic, has CKD, I have never been able to examine the patient, this is a virtual visit.  Suspect he probably have some degree of PVD. Plan: Doxycycline x10 days, mupirocin, avoid pressure in the area, wound care center referral, x-ray Addendum: X-ray show changes consistent with osteomyelitis, see report.  Cancel wound care center surgery referral and referred to Ortho, Dr. Sharol Given.  This was discussed with the patient. Follow-up in 2 weeks, will arrange   Time spent > 25 min  I discussed the assessment and treatment plan with the patient. The patient was provided an opportunity to ask questions and all were answered. The patient agreed with the plan and  demonstrated an understanding of the instructions.   The patient was advised to call back or seek an in-person evaluation if the symptoms worsen or if the condition fails to improve as anticipated.

## 2019-04-12 NOTE — Telephone Encounter (Signed)
Prescription sent, proceed with antibiotics

## 2019-04-13 NOTE — Assessment & Plan Note (Signed)
Toe infection x2: The patient is diabetic, has CKD, I have never been able to examine the patient, this is a virtual visit.  Suspect he probably have some degree of PVD. Plan: Doxycycline x10 days, mupirocin, avoid pressure in the area, wound care center referral, x-ray Addendum: X-ray show changes consistent with osteomyelitis, see report.  Cancel wound care center surgery referral and referred to Ortho, Dr. Sharol Given.  This was discussed with the patient. Follow-up in 2 weeks, will arrange

## 2019-04-16 ENCOUNTER — Encounter: Payer: Self-pay | Admitting: Family Medicine

## 2019-04-16 ENCOUNTER — Telehealth: Payer: Self-pay | Admitting: Internal Medicine

## 2019-04-16 ENCOUNTER — Other Ambulatory Visit: Payer: Self-pay

## 2019-04-16 ENCOUNTER — Ambulatory Visit (INDEPENDENT_AMBULATORY_CARE_PROVIDER_SITE_OTHER): Payer: Medicare Other | Admitting: Family Medicine

## 2019-04-16 VITALS — BP 148/64 | HR 61 | Temp 98.3°F | Resp 18 | Ht 75.0 in | Wt 300.0 lb

## 2019-04-16 DIAGNOSIS — L089 Local infection of the skin and subcutaneous tissue, unspecified: Secondary | ICD-10-CM

## 2019-04-16 DIAGNOSIS — Z89419 Acquired absence of unspecified great toe: Secondary | ICD-10-CM

## 2019-04-16 DIAGNOSIS — E118 Type 2 diabetes mellitus with unspecified complications: Secondary | ICD-10-CM | POA: Diagnosis not present

## 2019-04-16 MED ORDER — SODIUM ZIRCONIUM CYCLOSILICATE 10 G PO PACK: 10.0000 g | PACK | Freq: Every day | ORAL | 3 refills | Status: DC

## 2019-04-16 NOTE — Telephone Encounter (Signed)
Copied from [place] [phone]. Topic: Referral - Question >> Apr 16, 2019 10:32 AM Louis Bradley wrote: Reason for CRM: pt has not heard about his urgent referral to ortho and wife is very concerned.  Please call [phone] >> Apr 16, 2019 10:47 AM Marin Olp L wrote: Patient's wife calling back to discuss his care. The ortho office he was referred to is CLOSED. I called myself. Please advise as patient is at risk for infection of his foot. Says she will call back every hour until she hears back.

## 2019-04-16 NOTE — Progress Notes (Signed)
[facility] [address], [place], [place] [address] [phone]  Date:  04/16/2019   Name:  Louis Bradley   DOB:  1946-03-01   MRN:  [id number]  PCP:  Colon Branch, MD    Chief Complaint: Foot Infection (last thursday, left foot)   History of Present Illness:  Louis Bradley is a 73 y.o. very pleasant male patient who presents with the following:  Patient of my partner Dr. Larose Kells, whom I have not seen in the past.  He has history of diabetes and end-stage renal disease, hypertension, osteomyelitis of the left toe  He first noticed an issue with his LEFT 2nd and 3rd toes 5 days ago- they were red and warm to the touch He did a virtual visit with Dr. Larose Kells on 5/28, at that point he was concerned about a foot infection and ordered an x-ray.  This showed osteomyelitis in the distal second and third toes, he was referred to Dr. Sharol Given with orthopedics-started on doxycycline  Apparently Dr. Jess Barters office was closed so he came in to see me today.  Suspect the orthopedist office has different hours due to pandemic  EXAM: LEFT FOOT - COMPLETE 3+ VIEW COMPARISON:  None. FINDINGS: Status post surgical amputation of distal portion of first metatarsal and phalanges. Lytic destruction is seen involving the distal portions of the second and third distal phalanges suggesting osteomyelitis. Moderate posterior calcaneal spurring is noted. IMPRESSION: Lytic destruction is seen involving the distal tufts of the second and third distal phalanges suggesting osteomyelitis. Status post surgical amputation of first toe.  He notes that the color in his toes is getting better He is using doxycycline as well as bactroban-this does seem to be helping  He lost his LEFT great toe to infection 3 years ago- it got infected with MRSA and had to be amputated.    He moved from [place] just about 6 weeks ago  Their daughter lives in this area  No fever, he  otherwise feels well He has reduced feeling in his feet and toes-not total numbness however He has not noted any pain - had a bit of discomfort in his toes  He is not aware of any injury to his toes   He has a local nephrologist already He is getting a fistula next week but is not on dialysis yet  Spoke with his wife on the phone as well today, answered all questions from patient and his wife  BP Readings from Last 3 Encounters:  04/16/19 (!) 148/64  04/04/19 140/71    Patient Active Problem List   Diagnosis Date Noted  . PCP NOTES >>>>>>>>>>>>>>> 03/01/2019  . Aortic stenosis 12/18/2018  . Iron deficiency anemia 09/09/2018  . S/P colostomy ([place]) 09/05/2016  . Vitamin D deficiency 04/26/2016  . Fournier gangrene 04/13/2015  . Chronic renal failure in pediatric patient, stage 4 (severe) ([place]) 04/09/2015  . Diabetes mellitus type 2 with complications ([place]) 24/26/8341  . Essential hypertension 04/08/2015  . Hyponatremia 04/08/2015  . Hyperkalemia 05/22/2012    Past Medical History:  Diagnosis Date  . Anemia   . Anesthesia complication    General anesthesia makes me feel foggy for long periods of time  . CKD (chronic kidney disease)   . Diabetes ([place])   . Fournier gangrene   . History of blood transfusion    x2  . Hyperlipidemia   . Hypertension   . OSA on  CPAP   . Osteomyelitis ([place]) 2016   L toe  . Renal atrophy, left   . Vitamin D deficiency     Past Surgical History:  Procedure Laterality Date  . COLOSTOMY  03/2015  . TOE AMPUTATION Left 2016   Left big toe amputated due to MRSA infection    Social History   Tobacco Use  . Smoking status: Former Research scientist (life sciences)  . Smokeless tobacco: Never Used  . Tobacco comment: quit 12/2018 (1 ppd)  Substance Use Topics  . Alcohol use: Yes    Comment: social  . Drug use: Never    Family History  Problem Relation Age of Onset  . CAD Father 98  . CAD Other        2 uncles had heart attacks  . Colon cancer Neg Hx   .  Prostate cancer Neg Hx     No Known Allergies  Medication list has been reviewed and updated.  Current Outpatient Medications on File Prior to Visit  Medication Sig Dispense Refill  . amLODipine (NORVASC) 10 MG tablet Take 1 tablet (10 mg total) by mouth daily. 90 tablet 1  . aspirin 325 MG tablet Take 325 mg by mouth daily.    . calcitRIOL (ROCALTROL) 0.25 MCG capsule TAKE 1 CAPSULE BY MOUTH THREE TIMES A WEEK    . carvedilol (COREG) 25 MG tablet Take 1 tablet (25 mg total) by mouth 2 (two) times daily with a meal. 180 tablet 1  . doxycycline (VIBRA-TABS) 100 MG tablet Take 1 tablet (100 mg total) by mouth 2 (two) times daily. 20 tablet 0  . furosemide (LASIX) 40 MG tablet Take 2 tablets (80 mg total) by mouth daily. 180 tablet 1  . glipiZIDE (GLUCOTROL) 5 MG tablet Take 2 tablets (10 mg total) by mouth 2 (two) times daily. 180 tablet 1  . mupirocin ointment (BACTROBAN) 2 % Place 1 application into the nose 2 (two) times daily. 22 g 0  . pravastatin (PRAVACHOL) 40 MG tablet Take 1 tablet (40 mg total) by mouth at bedtime. 90 tablet 1  . Probiotic Product (PROBIOTIC DAILY PO) Take 1 tablet by mouth daily.    . sodium bicarbonate 650 MG tablet Take 650 mg by mouth daily.     No current facility-administered medications on file prior to visit.     Review of Systems:  As per HPI- otherwise negative.   Physical Examination: Vitals:   04/16/19 1458  BP: (!) 148/64  Pulse: 61  Resp: 18  Temp: 98.3 F (36.8 C)  SpO2: 97%   Vitals:   04/16/19 1458  Weight: 300 lb (136.1 kg)  Height: 6\' 3"  (1.905 m)   Body mass index is 37.5 kg/m. Ideal Body Weight: Weight in (lb) to have BMI = 25: 199.6  GEN: WDWN, NAD, Non-toxic, A & O x 3, overweight, tall build.  Looks well HEENT: Atraumatic, Normocephalic. Neck supple. No masses, No LAD. Ears and Nose: No external deformity. CV: RRR, No M/G/R. No JVD. No thrill. No extra heart sounds. PULM: CTA B, no wheezes, crackles, rhonchi. No  retractions. No resp. distress. No accessory muscle use. EXTR: No c/c/e NEURO Normal gait.  PSYCH: Normally interactive. Conversant. Not depressed or anxious appearing.  Calm demeanor.  Left foot: great toe is missing He has tiny wounds on the very tips of the 2nd and 3rd toes  Mild redness of his 2nd and 3rd toes, minimal swelling Normal pulses of both feet, the feet are warm and well-perfused.  Photos of his foot are taken on haiku today-should be visible in his chart  Assessment and Plan: Toe infection  History of amputation of great toe ([place]) - Plan: Ambulatory referral to Podiatry  Diabetes mellitus type 2 with complications ([place])  Office visit today for concern of osteomyelitis of the left second and third toes.  Luisfernando is taking doxycycline, and is clinically showing improvement. I was able to speak with Dr. Sharol Given on the phone, he kindly offered to see the patient in his clinic this week For now we will continue doxycycline I asked the patient to call Dr. Jess Barters office tomorrow, I will also follow-up to be sure he gets an appointment  Signed Lamar Blinks, MD [facility] ortho [phone]

## 2019-04-16 NOTE — Telephone Encounter (Signed)
Can we have this referral sent to a new place please?

## 2019-04-16 NOTE — Progress Notes (Signed)
Photos only

## 2019-04-16 NOTE — Patient Instructions (Signed)
I spoke with the orthopedist, Dr Sharol Given.  He wants you to continue doxycycline and be seen this week at his office The number there is [phone]; he asked that you call first thing in the am.   I will call them as well to make sure we have success!   For now continue the doxycycline I placed a referral for you to establish with a local podiatrist

## 2019-04-17 ENCOUNTER — Encounter: Payer: Self-pay | Admitting: Orthopedic Surgery

## 2019-04-17 ENCOUNTER — Ambulatory Visit (INDEPENDENT_AMBULATORY_CARE_PROVIDER_SITE_OTHER): Payer: Medicare Other | Admitting: Orthopedic Surgery

## 2019-04-17 VITALS — Ht 75.0 in | Wt 300.0 lb

## 2019-04-17 DIAGNOSIS — M170 Bilateral primary osteoarthritis of knee: Secondary | ICD-10-CM | POA: Diagnosis not present

## 2019-04-17 DIAGNOSIS — M869 Osteomyelitis, unspecified: Secondary | ICD-10-CM | POA: Insufficient documentation

## 2019-04-17 NOTE — Telephone Encounter (Signed)
Pt has appt with Dr. Sharol Given today

## 2019-04-17 NOTE — Telephone Encounter (Signed)
-----   Message from Darreld Mclean, MD sent at 04/16/2019  4:50 PM EDT ----- Call Louis Bradley, be sure he gets appointment

## 2019-04-17 NOTE — Progress Notes (Signed)
Office Visit Note   Patient: Louis Bradley           Date of Birth: [birth year]           MRN: [id number] Visit Date: 04/17/2019              Requested by: Colon Branch, [address] [place], [place] [address] PCP: Colon Branch, MD  Chief Complaint  Patient presents with  . Left Foot - Open Wound      HPI: Patient is a 73 year old gentleman with diabetic insensate neuropathy end-stage renal disease is scheduled to start dialysis with osteomyelitis of the left foot second toe and third toe.  He is status post amputation of the great toe.  Patient does use a CPAP machine.  Patient also complains of chronic osteoarthritis of both knees he states he is unable to have steroid injections due to his multiple medical problems.  Assessment & Plan: Visit Diagnoses:  1. Osteomyelitis of second toe of left foot ([place])   2. Osteomyelitis of third toe of left foot ([place])     Plan: Discussed with the patient the osteomyelitis and recommendation to proceed with amputation of the second and third toe.  Risks and benefits were discussed including risk of the wound not healing with increased weightbearing.  Patient states he understands patient's family was on the phone and they all wish to proceed with surgery on Friday.  We will request authorization for hyaluronic acid injections for both knees.  Follow-Up Instructions: Return in about 1 week (around 04/24/2019).   Ortho Exam  Patient is alert, oriented, no adenopathy, well-dressed, normal affect, normal respiratory effort. Examination patient has venous stasis changes in both legs but no open ulcers.  He has a good dorsalis pedis pulse.  Left foot he has sausage digit swelling of the second toe and third toe with cellulitis there are 2 open wounds on the second and third toe which both probe to bone.  There is no purulent drainage no ascending cellulitis.  Patient also has osteoarthritis in both knees he is not a good candidate for  steroid injections and patient and family request authorization for hyaluronic acid injections for both knees.  Both knees are tender to palpation.  Crepitation with range of motion collaterals and cruciates are stable.  Imaging: No results found. No images are attached to the encounter.  Labs: No results found for: HGBA1C, ESRSEDRATE, CRP, LABURIC, REPTSTATUS, GRAMSTAIN, CULT, LABORGA   No results found for: ALBUMIN, PREALBUMIN, LABURIC  Body mass index is 37.5 kg/m.  Orders:  No orders of the defined types were placed in this encounter.  No orders of the defined types were placed in this encounter.    Procedures: No procedures performed  Clinical Data: No additional findings.  ROS:  All other systems negative, except as noted in the HPI. Review of Systems  Objective: Vital Signs: Ht 6\' 3"  (1.905 m)   Wt 300 lb (136.1 kg)   BMI 37.50 kg/m   Specialty Comments:  No specialty comments available.  PMFS History: Patient Active Problem List   Diagnosis Date Noted  . Osteomyelitis of third toe of left foot ([place]) 04/17/2019  . Osteomyelitis of second toe of left foot ([place]) 04/17/2019  . PCP NOTES >>>>>>>>>>>>>>> 03/01/2019  . Aortic stenosis 12/18/2018  . Iron deficiency anemia 09/09/2018  . S/P colostomy ([place]) 09/05/2016  . Vitamin D deficiency 04/26/2016  . Fournier gangrene 04/13/2015  . Chronic renal  failure in pediatric patient, stage 4 (severe) ([place]) 04/09/2015  . Diabetes mellitus type 2 with complications ([place]) 90/24/0973  . Essential hypertension 04/08/2015  . Hyponatremia 04/08/2015  . Hyperkalemia 05/22/2012   Past Medical History:  Diagnosis Date  . Anemia   . Anesthesia complication    General anesthesia makes me feel foggy for long periods of time  . CKD (chronic kidney disease)   . Diabetes ([place])   . Fournier gangrene   . History of blood transfusion    x2  . Hyperlipidemia   . Hypertension   . OSA on CPAP   . Osteomyelitis ([place]) 2016    L toe  . Renal atrophy, left   . Vitamin D deficiency     Family History  Problem Relation Age of Onset  . CAD Father 58  . CAD Other        2 uncles had heart attacks  . Colon cancer Neg Hx   . Prostate cancer Neg Hx     Past Surgical History:  Procedure Laterality Date  . COLOSTOMY  03/2015  . TOE AMPUTATION Left 2016   Left big toe amputated due to MRSA infection   Social History   Occupational History  . Occupation: retired-owned a Environmental manager   . Occupation: retired- Theatre manager  Tobacco Use  . Smoking status: Former Research scientist (life sciences)  . Smokeless tobacco: Never Used  . Tobacco comment: quit 12/2018 (1 ppd)  Substance and Sexual Activity  . Alcohol use: Yes    Comment: social  . Drug use: Never  . Sexual activity: Not on file

## 2019-04-18 ENCOUNTER — Telehealth: Payer: Self-pay

## 2019-04-18 NOTE — Telephone Encounter (Signed)
-----   Message from Pamella Pert, [place] sent at 04/17/2019  3:09 PM EDT ----- Regarding: surgery friday Pt is sch for a left foot 2nd and 3rd toe amputation Friday with Dr. Sharol Given. He is working on dictation now.

## 2019-04-18 NOTE — Telephone Encounter (Signed)
No auth required for surgery. Pt has medicare and aetna supplement secondary. No precert required for aetna per back of card.

## 2019-04-19 ENCOUNTER — Other Ambulatory Visit (HOSPITAL_COMMUNITY)
Admission: RE | Admit: 2019-04-19 | Discharge: 2019-04-19 | Disposition: A | Discharge status: Home / Self Care | Payer: Medicare Other | Source: Ambulatory Visit | Attending: Orthopedic Surgery | Admitting: Orthopedic Surgery

## 2019-04-19 ENCOUNTER — Encounter (HOSPITAL_COMMUNITY): Payer: Self-pay | Admitting: *Deleted

## 2019-04-19 ENCOUNTER — Other Ambulatory Visit: Payer: Self-pay

## 2019-04-19 DIAGNOSIS — Z1159 Encounter for screening for other viral diseases: Secondary | ICD-10-CM | POA: Insufficient documentation

## 2019-04-19 LAB — SARS CORONAVIRUS 2 BY RT PCR (HOSPITAL ORDER, PERFORMED IN ~~LOC~~ HOSPITAL LAB): SARS Coronavirus 2: NEGATIVE

## 2019-04-19 MED ORDER — DEXTROSE 5 % IV SOLN
3.0000 g | INTRAVENOUS | Status: AC
  Administered 2019-04-20: 3 g via INTRAVENOUS
  Filled 2019-04-19 (×2): qty 3000

## 2019-04-19 NOTE — Progress Notes (Signed)
Spoke with pt's wife Lorin for pre-op call after getting permission from patient. Pt has hx of a heart murmur that wife states he was born with and has never had any problems with it. Pt is a type 2 diabetic. Last A1C was in the "high five's" about 3 months ago. Lorin states pt's fasting blood sugar is usually between 92-125. Instructed Lorin to have pt not take his Glipizide tonight or in the AM. Instructed her that pt needs to check his blood sugar when he gets up in the AM and every 2 hours until he leaves for the hospital. If blood sugar is 70 or below, treat with 1/2 cup of clear juice (apple or cranberry) and recheck blood sugar 15 minutes after drinking juice. If blood sugar continues to be 70 or below, call the Short Stay department and ask to speak to a nurse. Lorin voiced understanding.  Since pt is diabetic and surgery doesn't start until 3:45 PM, I spoke with Dr. Fransisco Beau, Anesthesiologist and he stated that pt could have clear liquids until 11:00 AM Friday, no food after midnight tonight. Lorin given these instructions. Pt's wife wanted to make sure that we are aware that any medications that are ordered for Mr. Odonell needs to have the approval of Dr. Harrie Jeans, pt's nephrologist at [facility]. Lorin states that includes antibiotics, sedation and anesthetics.   Pt has an appt today for Covid 19 testing at 1:35 PM. I instructed Lorin that pt will need to go straight home after the test and stay there until he leaves for surgery tomorrow. If she goes out she needs to wear a mask and practice good handwashing. She understands no other people need to be at their house unless they live with them.    Coronavirus Screening  Have you experienced the following symptoms:  Cough NO Fever (>100.74F) NO  Runny nose NO Sore throat NO Difficulty breathing/shortness of breath NO   Have you or a family member traveled in the last 14 days and where? NO    Lorin, wife of pt reminded that  hospital visitation restrictions are in effect and the importance of the restrictions.

## 2019-04-20 ENCOUNTER — Encounter (HOSPITAL_COMMUNITY): Payer: Self-pay | Admitting: *Deleted

## 2019-04-20 ENCOUNTER — Other Ambulatory Visit: Payer: Self-pay

## 2019-04-20 ENCOUNTER — Ambulatory Visit (HOSPITAL_COMMUNITY)
Admission: RE | Admit: 2019-04-20 | Discharge: 2019-04-20 | Disposition: A | Discharge status: Home / Self Care | Payer: Medicare Other | Attending: Orthopedic Surgery | Admitting: Orthopedic Surgery

## 2019-04-20 ENCOUNTER — Ambulatory Visit (HOSPITAL_COMMUNITY): Payer: Medicare Other | Admitting: Certified Registered Nurse Anesthetist

## 2019-04-20 ENCOUNTER — Encounter (HOSPITAL_COMMUNITY)
Admission: RE | Disposition: A | Discharge status: Home / Self Care | Payer: Self-pay | Source: Home / Self Care | Attending: Orthopedic Surgery

## 2019-04-20 ENCOUNTER — Other Ambulatory Visit: Payer: Self-pay | Admitting: Internal Medicine

## 2019-04-20 DIAGNOSIS — E1140 Type 2 diabetes mellitus with diabetic neuropathy, unspecified: Secondary | ICD-10-CM | POA: Insufficient documentation

## 2019-04-20 DIAGNOSIS — I120 Hypertensive chronic kidney disease with stage 5 chronic kidney disease or end stage renal disease: Secondary | ICD-10-CM | POA: Diagnosis not present

## 2019-04-20 DIAGNOSIS — E1169 Type 2 diabetes mellitus with other specified complication: Secondary | ICD-10-CM | POA: Diagnosis present

## 2019-04-20 DIAGNOSIS — M1990 Unspecified osteoarthritis, unspecified site: Secondary | ICD-10-CM | POA: Diagnosis not present

## 2019-04-20 DIAGNOSIS — M869 Osteomyelitis, unspecified: Secondary | ICD-10-CM | POA: Diagnosis not present

## 2019-04-20 DIAGNOSIS — N186 End stage renal disease: Secondary | ICD-10-CM | POA: Insufficient documentation

## 2019-04-20 DIAGNOSIS — R011 Cardiac murmur, unspecified: Secondary | ICD-10-CM | POA: Diagnosis not present

## 2019-04-20 DIAGNOSIS — Z8249 Family history of ischemic heart disease and other diseases of the circulatory system: Secondary | ICD-10-CM | POA: Insufficient documentation

## 2019-04-20 DIAGNOSIS — Z87891 Personal history of nicotine dependence: Secondary | ICD-10-CM | POA: Insufficient documentation

## 2019-04-20 DIAGNOSIS — E785 Hyperlipidemia, unspecified: Secondary | ICD-10-CM | POA: Insufficient documentation

## 2019-04-20 DIAGNOSIS — E559 Vitamin D deficiency, unspecified: Secondary | ICD-10-CM | POA: Diagnosis not present

## 2019-04-20 DIAGNOSIS — Z89412 Acquired absence of left great toe: Secondary | ICD-10-CM | POA: Insufficient documentation

## 2019-04-20 DIAGNOSIS — G4733 Obstructive sleep apnea (adult) (pediatric): Secondary | ICD-10-CM | POA: Insufficient documentation

## 2019-04-20 DIAGNOSIS — E1122 Type 2 diabetes mellitus with diabetic chronic kidney disease: Secondary | ICD-10-CM | POA: Diagnosis not present

## 2019-04-20 HISTORY — DX: Cardiac murmur, unspecified: R01.1

## 2019-04-20 HISTORY — PX: AMPUTATION TOE: SHX6595

## 2019-04-20 HISTORY — DX: Unspecified osteoarthritis, unspecified site: M19.90

## 2019-04-20 LAB — GLUCOSE, CAPILLARY
Glucose-Capillary: 153 mg/dL — ABNORMAL HIGH (ref 70–99)
Glucose-Capillary: 132 mg/dL — ABNORMAL HIGH (ref 70–99)

## 2019-04-20 LAB — CBC
HCT: 33.2 % — ABNORMAL LOW (ref 39.0–52.0)
Hemoglobin: 11.1 g/dL — ABNORMAL LOW (ref 13.0–17.0)
MCH: 29.1 pg (ref 26.0–34.0)
MCHC: 33.4 g/dL (ref 30.0–36.0)
MCV: 87.1 fL (ref 80.0–100.0)
Platelets: 172 10*3/uL (ref 150–400)
RBC: 3.81 MIL/uL — ABNORMAL LOW (ref 4.22–5.81)
RDW: 13.7 % (ref 11.5–15.5)
WBC: 8.5 10*3/uL (ref 4.0–10.5)
nRBC: 0 % (ref 0.0–0.2)

## 2019-04-20 LAB — SURGICAL PCR SCREEN
MRSA, PCR: NEGATIVE
Staphylococcus aureus: NEGATIVE

## 2019-04-20 LAB — BASIC METABOLIC PANEL
Anion gap: 12 (ref 5–15)
BUN: 74 mg/dL — ABNORMAL HIGH (ref 8–23)
CO2: 20 mmol/L — ABNORMAL LOW (ref 22–32)
Calcium: 9 mg/dL (ref 8.9–10.3)
Chloride: 104 mmol/L (ref 98–111)
Creatinine, Ser: 4.76 mg/dL — ABNORMAL HIGH (ref 0.61–1.24)
GFR calc Af Amer: 13 mL/min — ABNORMAL LOW (ref >60)
GFR calc non Af Amer: 11 mL/min — ABNORMAL LOW (ref >60)
Glucose, Bld: 143 mg/dL — ABNORMAL HIGH (ref 70–99)
Potassium: 4.7 mmol/L (ref 3.5–5.1)
Sodium: 136 mmol/L (ref 135–145)

## 2019-04-20 SURGERY — AMPUTATION, TOE
Anesthesia: General | Site: Foot | Laterality: Left

## 2019-04-20 MED ORDER — PHENYLEPHRINE 40 MCG/ML (10ML) SYRINGE FOR IV PUSH (FOR BLOOD PRESSURE SUPPORT)
PREFILLED_SYRINGE | INTRAVENOUS | Status: AC
  Filled 2019-04-20: qty 10

## 2019-04-20 MED ORDER — ONDANSETRON HCL 4 MG/2ML IJ SOLN
INTRAMUSCULAR | Status: DC | PRN
  Administered 2019-04-20: 4 mg via INTRAVENOUS

## 2019-04-20 MED ORDER — OXYCODONE HCL 5 MG/5ML PO SOLN: 5.0000 mg | Freq: Once | ORAL | Status: DC | PRN

## 2019-04-20 MED ORDER — HYDROMORPHONE HCL 1 MG/ML IJ SOLN: 0.2500 mg | INTRAMUSCULAR | Status: DC | PRN

## 2019-04-20 MED ORDER — PROMETHAZINE HCL 25 MG/ML IJ SOLN: 6.2500 mg | INTRAMUSCULAR | Status: DC | PRN

## 2019-04-20 MED ORDER — POVIDONE-IODINE 10 % EX SWAB: 2.0000 "application " | Freq: Once | CUTANEOUS | Status: DC

## 2019-04-20 MED ORDER — PHENYLEPHRINE 40 MCG/ML (10ML) SYRINGE FOR IV PUSH (FOR BLOOD PRESSURE SUPPORT)
PREFILLED_SYRINGE | INTRAVENOUS | Status: DC | PRN
  Administered 2019-04-20: 120 ug via INTRAVENOUS

## 2019-04-20 MED ORDER — SODIUM CHLORIDE 0.9 % IV SOLN
INTRAVENOUS | Status: DC
  Administered 2019-04-20: 12:00:00 via INTRAVENOUS

## 2019-04-20 MED ORDER — PROPOFOL 10 MG/ML IV BOLUS
INTRAVENOUS | Status: DC | PRN
  Administered 2019-04-20: 200 mg via INTRAVENOUS

## 2019-04-20 MED ORDER — FENTANYL CITRATE (PF) 100 MCG/2ML IJ SOLN
INTRAMUSCULAR | Status: DC | PRN
  Administered 2019-04-20: 50 ug via INTRAVENOUS

## 2019-04-20 MED ORDER — CEFAZOLIN SODIUM-DEXTROSE 2-4 GM/100ML-% IV SOLN: 2.0000 g | INTRAVENOUS | Status: DC

## 2019-04-20 MED ORDER — PROPOFOL 10 MG/ML IV BOLUS
INTRAVENOUS | Status: AC
  Filled 2019-04-20: qty 40

## 2019-04-20 MED ORDER — HYDROCODONE-ACETAMINOPHEN 5-325 MG PO TABS: 1.0000 | ORAL_TABLET | ORAL | 0 refills | Status: DC | PRN

## 2019-04-20 MED ORDER — OXYCODONE HCL 5 MG PO TABS: 5.0000 mg | ORAL_TABLET | Freq: Once | ORAL | Status: DC | PRN

## 2019-04-20 MED ORDER — CHLORHEXIDINE GLUCONATE 4 % EX LIQD: 60.0000 mL | Freq: Once | CUTANEOUS | Status: DC

## 2019-04-20 MED ORDER — 0.9 % SODIUM CHLORIDE (POUR BTL) OPTIME
TOPICAL | Status: DC | PRN
  Administered 2019-04-20: 14:00:00 1000 mL

## 2019-04-20 MED ORDER — FENTANYL CITRATE (PF) 250 MCG/5ML IJ SOLN
INTRAMUSCULAR | Status: AC
  Filled 2019-04-20: qty 5

## 2019-04-20 MED ORDER — LIDOCAINE 2% (20 MG/ML) 5 ML SYRINGE
INTRAMUSCULAR | Status: DC | PRN
  Administered 2019-04-20: 100 mg via INTRAVENOUS

## 2019-04-20 MED ORDER — ONDANSETRON HCL 4 MG/2ML IJ SOLN
INTRAMUSCULAR | Status: AC
  Filled 2019-04-20: qty 2

## 2019-04-20 MED ORDER — ENSURE PRE-SURGERY PO LIQD
296.0000 mL | Freq: Once | ORAL | Status: DC
  Filled 2019-04-20: qty 296

## 2019-04-20 SURGICAL SUPPLY — 28 items
BLADE SURG 21 STRL SS (BLADE) ×2 IMPLANT
BNDG COHESIVE 4X5 TAN STRL (GAUZE/BANDAGES/DRESSINGS) ×2 IMPLANT
BNDG ESMARK 4X9 LF (GAUZE/BANDAGES/DRESSINGS) IMPLANT
BNDG GAUZE ELAST 4 BULKY (GAUZE/BANDAGES/DRESSINGS) ×2 IMPLANT
COVER SURGICAL LIGHT HANDLE (MISCELLANEOUS) ×4 IMPLANT
COVER WAND RF STERILE (DRAPES) ×2 IMPLANT
DRAPE U-SHAPE 47X51 STRL (DRAPES) ×2 IMPLANT
DRSG ADAPTIC 3X8 NADH LF (GAUZE/BANDAGES/DRESSINGS) ×1 IMPLANT
DRSG PAD ABDOMINAL 8X10 ST (GAUZE/BANDAGES/DRESSINGS) ×2 IMPLANT
DURAPREP 26ML APPLICATOR (WOUND CARE) ×2 IMPLANT
ELECT REM PT RETURN 9FT ADLT (ELECTROSURGICAL) ×2
ELECTRODE REM PT RTRN 9FT ADLT (ELECTROSURGICAL) ×1 IMPLANT
GAUZE SPONGE 4X4 12PLY STRL (GAUZE/BANDAGES/DRESSINGS) ×1 IMPLANT
GLOVE BIOGEL PI IND STRL 9 (GLOVE) ×1 IMPLANT
GLOVE BIOGEL PI INDICATOR 9 (GLOVE) ×1
GLOVE SURG ORTHO 9.0 STRL STRW (GLOVE) ×2 IMPLANT
GOWN STRL REUS W/ TWL XL LVL3 (GOWN DISPOSABLE) ×2 IMPLANT
GOWN STRL REUS W/TWL XL LVL3 (GOWN DISPOSABLE) ×2
KIT BASIN OR (CUSTOM PROCEDURE TRAY) ×2 IMPLANT
KIT TURNOVER KIT B (KITS) ×2 IMPLANT
MANIFOLD NEPTUNE II (INSTRUMENTS) ×2 IMPLANT
NEEDLE 22X1 1/2 (OR ONLY) (NEEDLE) IMPLANT
NS IRRIG 1000ML POUR BTL (IV SOLUTION) ×2 IMPLANT
PACK ORTHO EXTREMITY (CUSTOM PROCEDURE TRAY) ×2 IMPLANT
PAD ARMBOARD 7.5X6 YLW CONV (MISCELLANEOUS) ×4 IMPLANT
SUT ETHILON 2 0 PSLX (SUTURE) ×2 IMPLANT
SYR CONTROL 10ML LL (SYRINGE) IMPLANT
TOWEL OR 17X26 10 PK STRL BLUE (TOWEL DISPOSABLE) ×2 IMPLANT

## 2019-04-20 NOTE — Anesthesia Preprocedure Evaluation (Signed)
Anesthesia Evaluation  Patient identified by MRN, date of birth, ID band Patient awake    Reviewed: Allergy & Precautions, NPO status , Patient's Chart, lab work & pertinent test results  Airway Mallampati: II  TM Distance: >3 FB Neck ROM: Full    Dental no notable dental hx.    Pulmonary sleep apnea , former smoker,    Pulmonary exam normal breath sounds clear to auscultation       Cardiovascular hypertension, negative cardio ROS Normal cardiovascular exam Rhythm:Regular Rate:Normal     Neuro/Psych negative neurological ROS  negative psych ROS   GI/Hepatic negative GI ROS, Neg liver ROS,   Endo/Other  negative endocrine ROSdiabetes  Renal/GU negative Renal ROS  negative genitourinary   Musculoskeletal  (+) Arthritis , Osteoarthritis,    Abdominal   Peds negative pediatric ROS (+)  Hematology negative hematology ROS (+)   Anesthesia Other Findings   Reproductive/Obstetrics negative OB ROS                             Anesthesia Physical Anesthesia Plan  ASA: III  Anesthesia Plan: General   Post-op Pain Management:    Induction: Intravenous  PONV Risk Score and Plan: 2 and Ondansetron and Midazolam  Airway Management Planned: LMA  Additional Equipment:   Intra-op Plan:   Post-operative Plan: Extubation in OR  Informed Consent: I have reviewed the patients History and Physical, chart, labs and discussed the procedure including the risks, benefits and alternatives for the proposed anesthesia with the patient or authorized representative who has indicated his/her understanding and acceptance.     Dental advisory given  Plan Discussed with: CRNA  Anesthesia Plan Comments:         Anesthesia Quick Evaluation

## 2019-04-20 NOTE — Op Note (Signed)
04/20/2019  2:08 PM  PATIENT:  Louis Bradley    PRE-OPERATIVE DIAGNOSIS:  OSTEOMEYLITIS LEFT SECOND AND THIRD TOES  POST-OPERATIVE DIAGNOSIS:  Same  PROCEDURE:  AMPUTATION LEFT SECOND AND THIRD TOES  SURGEON:  Newt Minion, MD  PHYSICIAN ASSISTANT:None ANESTHESIA:   General  PREOPERATIVE INDICATIONS:  [name] is a  73 y.o. male with a diagnosis of OSTEOMEYLITIS LEFT SECOND AND THIRD TOES who failed conservative measures and elected for surgical management.    The risks benefits and alternatives were discussed with the patient preoperatively including but not limited to the risks of infection, bleeding, nerve injury, cardiopulmonary complications, the need for revision surgery, among others, and the patient was willing to proceed.  OPERATIVE IMPLANTS: None  @ENCIMAGES @  OPERATIVE FINDINGS: Good petechial bleeding at the MTP joint no abscess  OPERATIVE PROCEDURE: Patient was brought the operating room and underwent general anesthetic.  After adequate levels anesthesia were obtained patient's left lower extremity was prepped using DuraPrep draped into a sterile field a timeout was called.  A fishmouth incision was made just distal to the MTP joint of the second and third toes.  This was carried down through the MTP joint and the second and third toe were resected from the MTP joint and one block of tissue.  There is no signs of abscess or ischemic tissue changes at the level of amputation.  The wound was irrigated with normal saline incision was closed using 2-0 nylon a sterile dressing was applied patient was taken the PACU in stable condition.   DISCHARGE PLANNING:  Antibiotic duration: Preoperative antibiotics  Weightbearing: Minimize weightbearing on the left lower extremity  Pain medication: Prescription for Vicodin  Dressing care/ Wound VAC: Follow-up in the office in 1 week to change the dressing  Ambulatory devices: [facility] or crutches   Discharge to:  home  Follow-up: In the office 1 week post operative.

## 2019-04-20 NOTE — Progress Notes (Signed)
Orthopedic Tech Progress Note Patient Details:  [name] Mar 07, 1946 [id number] PACU RN called requesting a Post Op Shoe Ortho Devices Type of Ortho Device: Postop shoe/boot Ortho Device/Splint Location: LLE Ortho Device/Splint Interventions: Adjustment, Application, Ordered   Post Interventions Patient Tolerated: Well Instructions Provided: Care of device, Adjustment of device   [name] 04/20/2019, 2:42 PM

## 2019-04-20 NOTE — Anesthesia Procedure Notes (Signed)
Procedure Name: LMA Insertion Date/Time: 04/20/2019 1:50 PM Performed by: Alain Marion, CRNA Pre-anesthesia Checklist: Patient identified, Emergency Drugs available, Suction available and Patient being monitored Patient Re-evaluated:Patient Re-evaluated prior to induction Oxygen Delivery Method: Circle System Utilized Preoxygenation: Pre-oxygenation with 100% oxygen Induction Type: IV induction Ventilation: Mask ventilation without difficulty LMA: LMA inserted LMA Size: 5.0 Number of attempts: 1 Airway Equipment and Method: Bite block Placement Confirmation: positive ETCO2 Tube secured with: Tape Dental Injury: Teeth and Oropharynx as per pre-operative assessment

## 2019-04-20 NOTE — H&P (Signed)
Louis Bradley is an 73 y.o. male.   Chief Complaint: Osteomyelitis left foot second third toes HPI: The patient is a 73 year old gentleman with diabetic insensate neuropathy, and end-stage renal disease who developed osteomyelitis of his second and third toe.  He is already status post amputation of the left great toe.  He presents for amputation of the left second and third toe.  Past Medical History:  Diagnosis Date  . Anemia   . Anesthesia complication    General anesthesia makes me feel foggy for long periods of time  . Arthritis   . CKD (chronic kidney disease)    Stage 4-5  . Complication of anesthesia    slow to come out of anesthesia - a lot of confusion   . Diabetes ([place])   . Fournier gangrene   . Heart murmur    Born with, no problems  . History of blood transfusion    x2  . Hyperlipidemia   . Hypertension   . OSA on CPAP   . Osteomyelitis ([place]) 2016   L toe  . Renal atrophy, left   . Vitamin D deficiency     Past Surgical History:  Procedure Laterality Date  . COLONOSCOPY    . COLOSTOMY  03/2015  . COLOSTOMY REVERSAL    . TOE AMPUTATION Left 2016   Left big toe amputated due to MRSA infection    Family History  Problem Relation Age of Onset  . CAD Father 25  . CAD Other        2 uncles had heart attacks  . Colon cancer Neg Hx   . Prostate cancer Neg Hx    Social History:  reports that he has quit smoking. He has never used smokeless tobacco. He reports current alcohol use. He reports that he does not use drugs.  Allergies: No Known Allergies  No medications prior to admission.    Results for orders placed or performed during the hospital encounter of 04/19/19 (from the past 48 hour(s))  SARS Coronavirus 2 (CEPHEID - Performed in [facility] hospital lab), Hosp Order     Status: None   Collection Time: 04/19/19  1:43 PM  Result Value Ref Range   SARS Coronavirus 2 NEGATIVE NEGATIVE    Comment: (NOTE) If result is NEGATIVE SARS-CoV-2 target  nucleic acids are NOT DETECTED. The SARS-CoV-2 RNA is generally detectable in upper and lower  respiratory specimens during the acute phase of infection. The lowest  concentration of SARS-CoV-2 viral copies this assay can detect is 250  copies / mL. A negative result does not preclude SARS-CoV-2 infection  and should not be used as the sole basis for treatment or other  patient management decisions.  A negative result may occur with  improper specimen collection / handling, submission of specimen other  than nasopharyngeal swab, presence of viral mutation(s) within the  areas targeted by this assay, and inadequate number of viral copies  (<250 copies / mL). A negative result must be combined with clinical  observations, patient history, and epidemiological information. If result is POSITIVE SARS-CoV-2 target nucleic acids are DETECTED. The SARS-CoV-2 RNA is generally detectable in upper and lower  respiratory specimens dur ing the acute phase of infection.  Positive  results are indicative of active infection with SARS-CoV-2.  Clinical  correlation with patient history and other diagnostic information is  necessary to determine patient infection status.  Positive results do  not rule out bacterial infection or co-infection with other viruses. If  result is PRESUMPTIVE POSTIVE SARS-CoV-2 nucleic acids MAY BE PRESENT.   A presumptive positive result was obtained on the submitted specimen  and confirmed on repeat testing.  While 2019 novel coronavirus  (SARS-CoV-2) nucleic acids may be present in the submitted sample  additional confirmatory testing may be necessary for epidemiological  and / or clinical management purposes  to differentiate between  SARS-CoV-2 and other Sarbecovirus currently known to infect humans.  If clinically indicated additional testing with an alternate test  methodology [phone]) is advised. The SARS-CoV-2 RNA is generally  detectable in upper and lower  respiratory sp ecimens during the acute  phase of infection. The expected result is Negative. Fact Sheet for Patients:  StrictlyIdeas.no Fact Sheet for Healthcare Providers: BankingDealers.co.za This test is not yet approved or cleared by the [place] FDA and has been authorized for detection and/or diagnosis of SARS-CoV-2 by FDA under an Emergency Use Authorization (EUA).  This EUA will remain in effect (meaning this test can be used) for the duration of the COVID-19 declaration under Section 564(b)(1) of the Act, 21 U.S.C. section 360bbb-3(b)(1), unless the authorization is terminated or revoked sooner. Performed at [facility], [place] [address], [place], [place] [address]    No results found.  Review of Systems  All other systems reviewed and are negative.   There were no vitals taken for this visit. Physical Exam  Constitutional: He is oriented to person, place, and time. He appears well-developed and well-nourished. No distress.  HENT:  Head: Normocephalic and atraumatic.  Neck: No tracheal deviation present. No thyromegaly present.  Cardiovascular: Normal rate.  Respiratory: Effort normal. No stridor. No respiratory distress.  GI: Soft.  Musculoskeletal:     Comments: Examination patient has venous stasis changes in both legs but no open ulcers.  He has a good dorsalis pedis pulse.  Left foot he has sausage digit swelling of the second toe and third toe with cellulitis there are 2 open wounds on the second and third toe which both probe to bone.  There is no purulent drainage no ascending cellulitis.  Neurological: He is alert and oriented to person, place, and time. No cranial nerve deficit.  Skin: Skin is warm.  Psychiatric: He has a normal mood and affect. His behavior is normal. Judgment and thought content normal.     Assessment/Plan Osteomyelitis second and third toe of left foot-plan left second  and third toe amputations-the procedure possible benefits and risk including the risks of bleeding, infection, neurovascular injury, and possible need for further surgery were discussed with the patient and his questions were answered to his satisfaction.  The patient wishes to proceed with surgery at this time.  Erlinda Hong, PA-C 04/20/2019, 8:13 AM  [facility] [phone]

## 2019-04-20 NOTE — Transfer of Care (Signed)
Immediate Anesthesia Transfer of Care Note  Patient: Louis Bradley  Procedure(s) Performed: AMPUTATION LEFT SECOND AND THIRD TOES (Left Foot)  Patient Location: PACU  Anesthesia Type:General  Level of Consciousness: awake, alert  and oriented  Airway & Oxygen Therapy: Patient Spontanous Breathing and Patient connected to face mask oxygen  Post-op Assessment: Report given to RN and Post -op Vital signs reviewed and stable  Post vital signs: Reviewed and stable  Last Vitals:  Vitals Value Taken Time  BP 130/65 04/20/2019  2:41 PM  Temp 36.5 C 04/20/2019  2:38 PM  Pulse 50 04/20/2019  2:40 PM  Resp 14 04/20/2019  2:40 PM  SpO2 95 % 04/20/2019  2:40 PM  Vitals shown include unvalidated device data.  Last Pain:  Vitals:   04/20/19 1438  TempSrc:   PainSc: Asleep      Patients Stated Pain Goal: 3 (19/16/60 6004)  Complications: No apparent anesthesia complications

## 2019-04-20 NOTE — Anesthesia Postprocedure Evaluation (Signed)
Anesthesia Post Note  Patient: Louis Bradley  Procedure(s) Performed: AMPUTATION LEFT SECOND AND THIRD TOES (Left Foot)     Patient location during evaluation: PACU Anesthesia Type: General Level of consciousness: awake and alert Pain management: pain level controlled Vital Signs Assessment: post-procedure vital signs reviewed and stable Respiratory status: spontaneous breathing, nonlabored ventilation and respiratory function stable Cardiovascular status: blood pressure returned to baseline and stable Postop Assessment: no apparent nausea or vomiting Anesthetic complications: no    Last Vitals:  Vitals:   04/20/19 1438 04/20/19 1500  BP: 130/65 138/63  Pulse: (!) 48 (!) 52  Resp: 12   Temp: 36.5 C   SpO2: 95% 95%    Last Pain:  Vitals:   04/20/19 1500  TempSrc:   PainSc: 0-No pain                 Lynda Rainwater

## 2019-04-21 ENCOUNTER — Encounter (HOSPITAL_COMMUNITY): Payer: Self-pay | Admitting: Orthopedic Surgery

## 2019-04-23 ENCOUNTER — Ambulatory Visit: Payer: Medicare Other | Admitting: Orthopedic Surgery

## 2019-04-24 ENCOUNTER — Ambulatory Visit (HOSPITAL_COMMUNITY): Admission: RE | Admit: 2019-04-24 | Payer: Medicare Other | Source: Home / Self Care | Admitting: Vascular Surgery

## 2019-04-24 ENCOUNTER — Encounter (HOSPITAL_COMMUNITY): Admission: RE | Payer: Self-pay | Source: Home / Self Care

## 2019-04-24 SURGERY — ARTERIOVENOUS (AV) FISTULA CREATION
Anesthesia: Monitor Anesthesia Care | Laterality: Left

## 2019-04-25 ENCOUNTER — Ambulatory Visit: Payer: Self-pay

## 2019-04-25 ENCOUNTER — Encounter: Payer: Self-pay | Admitting: Family

## 2019-04-25 ENCOUNTER — Other Ambulatory Visit: Payer: Self-pay

## 2019-04-25 ENCOUNTER — Ambulatory Visit (INDEPENDENT_AMBULATORY_CARE_PROVIDER_SITE_OTHER): Payer: Medicare Other | Admitting: Family

## 2019-04-25 VITALS — Ht 75.0 in | Wt 300.0 lb

## 2019-04-25 DIAGNOSIS — M869 Osteomyelitis, unspecified: Secondary | ICD-10-CM

## 2019-04-25 DIAGNOSIS — M25572 Pain in left ankle and joints of left foot: Secondary | ICD-10-CM

## 2019-04-25 DIAGNOSIS — M25562 Pain in left knee: Secondary | ICD-10-CM

## 2019-04-25 DIAGNOSIS — M25561 Pain in right knee: Secondary | ICD-10-CM

## 2019-04-25 DIAGNOSIS — G8929 Other chronic pain: Secondary | ICD-10-CM

## 2019-04-25 NOTE — Progress Notes (Signed)
Office Visit Note   Patient: Louis Bradley           Date of Birth: [birth year]           MRN: [id number] Visit Date: 04/25/2019              Requested by: Colon Branch, [address] [place], [place] [address] PCP: Colon Branch, MD  Chief Complaint  Patient presents with  . Left Foot - Routine Post Op    04/20/2019 left foot 2nd and 3rd toe amputation.       HPI: The patient is a 73 year old gentleman who presents today status post left second and third toe amputations on June 5 of this year.  Also concern for bilateral knee pain this is chronic in nature.  Has discussed having supplemental injections with Dr. Sharol Given in the past.  Requesting radiographs of his knees today.  Also concern for some ankle pain that has been acute on chronic this is been ongoing for quite some time but is worse over the last week he is concerned for swelling stiffness and pain wife wonders if this could be a DVT.  Patient not having any calf pain not concerned for DVT however wonders if he just has arthritis in his ankle.  Assessment & Plan: Visit Diagnoses:  1. Chronic pain of both knees   2. Osteomyelitis of second toe of left foot ([place])   3. Osteomyelitis of third toe of left foot ([facility])   4. Pain in left ankle and joints of left foot     Plan: Begin daily Dial soap cleansing of the incision may shower may get this wet no submerging.  Apply dry dressing elevate for swelling continue nonweightbearing until sutures are harvested.  He will follow-up in the office next week for incision recheck.  Radiographs of his knees done revealing for end-stage osteoarthritis bilateral knees will order supplemental injections.  Discussed conservative management of left ankle pain.  Likely due to surgical swelling as well as some arthritic changes.  Follow-Up Instructions: No follow-ups on file.   Ortho Exam  Patient is alert, oriented, no adenopathy, well-dressed, normal affect, normal respiratory effort.  On examination of the left lower extremity incision is clean dry and intact sutures in place.  Moderate swelling to the foot and ankle.  Imaging: No results found. No images are attached to the encounter.  Labs: No results found for: HGBA1C, ESRSEDRATE, CRP, LABURIC, REPTSTATUS, GRAMSTAIN, CULT, LABORGA   No results found for: ALBUMIN, PREALBUMIN, LABURIC  Body mass index is 37.5 kg/m.  Orders:  Orders Placed This Encounter  Procedures  . XR Knee 1-2 Views Right  . XR Knee 1-2 Views Left  . XR Ankle Complete Left   No orders of the defined types were placed in this encounter.    Procedures: No procedures performed  Clinical Data: No additional findings.  ROS:  All other systems negative, except as noted in the HPI. Review of Systems  Constitutional: Negative for chills and fever.  Cardiovascular: Negative for leg swelling.  Musculoskeletal: Positive for arthralgias and joint swelling.  Skin: Negative for color change.    Objective: Vital Signs: Ht 6\' 3"  (1.905 m)   Wt 300 lb (136.1 kg)   BMI 37.50 kg/m   Specialty Comments:  No specialty comments available.  PMFS History: Patient Active Problem List   Diagnosis Date Noted  . Osteomyelitis of third toe of left foot ([place]) 04/17/2019  .  Osteomyelitis of second toe of left foot ([place]) 04/17/2019  . Bilateral primary osteoarthritis of knee 04/17/2019  . PCP NOTES >>>>>>>>>>>>>>> 03/01/2019  . Aortic stenosis 12/18/2018  . Iron deficiency anemia 09/09/2018  . S/P colostomy ([place]) 09/05/2016  . Vitamin D deficiency 04/26/2016  . Chronic renal failure in pediatric patient, stage 4 (severe) ([place]) 04/09/2015  . Diabetes mellitus type 2 with complications ([place]) 37/79/3968  . Essential hypertension 04/08/2015  . Hyponatremia 04/08/2015  . Hyperkalemia 05/22/2012   Past Medical History:  Diagnosis Date  . Anemia   . Anesthesia complication    General anesthesia makes me feel foggy for long periods of time  .  Arthritis   . CKD (chronic kidney disease)    Stage 4-5  . Complication of anesthesia    slow to come out of anesthesia - a lot of confusion   . Diabetes ([place])   . Fournier gangrene   . Heart murmur    Born with, no problems  . History of blood transfusion    x2  . Hyperlipidemia   . Hypertension   . OSA on CPAP   . Osteomyelitis ([place]) 2016   L toe  . Renal atrophy, left   . Vitamin D deficiency     Family History  Problem Relation Age of Onset  . CAD Father 44  . CAD Other        2 uncles had heart attacks  . Colon cancer Neg Hx   . Prostate cancer Neg Hx     Past Surgical History:  Procedure Laterality Date  . AMPUTATION TOE Left 04/20/2019   Procedure: AMPUTATION LEFT SECOND AND THIRD TOES;  Surgeon: Newt Minion, MD;  Location: [facility];  Service: Orthopedics;  Laterality: Left;  . COLONOSCOPY    . COLOSTOMY  03/2015  . COLOSTOMY REVERSAL    . TOE AMPUTATION Left 2016   Left big toe amputated due to MRSA infection   Social History   Occupational History  . Occupation: retired-owned a Environmental manager   . Occupation: retired- Theatre manager  Tobacco Use  . Smoking status: Former Research scientist (life sciences)  . Smokeless tobacco: Never Used  . Tobacco comment: quit 12/2018 (1 ppd)  Substance and Sexual Activity  . Alcohol use: Yes    Comment: social  . Drug use: Never  . Sexual activity: Not on file

## 2019-04-26 ENCOUNTER — Inpatient Hospital Stay: Payer: Medicare Other | Admitting: Physician Assistant

## 2019-04-26 ENCOUNTER — Ambulatory Visit (INDEPENDENT_AMBULATORY_CARE_PROVIDER_SITE_OTHER): Payer: Medicare Other | Admitting: Internal Medicine

## 2019-04-26 DIAGNOSIS — N184 Chronic kidney disease, stage 4 (severe): Secondary | ICD-10-CM

## 2019-04-26 DIAGNOSIS — E1169 Type 2 diabetes mellitus with other specified complication: Secondary | ICD-10-CM

## 2019-04-26 DIAGNOSIS — E785 Hyperlipidemia, unspecified: Secondary | ICD-10-CM | POA: Diagnosis not present

## 2019-04-26 DIAGNOSIS — M869 Osteomyelitis, unspecified: Secondary | ICD-10-CM

## 2019-04-26 NOTE — Assessment & Plan Note (Signed)
Osteomyelitis,Status post amputation of the left second and third toes.  Recovering well per Bobette Mo checkup yesterday. Of note is, after the last visit with me, he saw Dr. Lorelei Pont, on  exam that he had good pedal pulses. CKD: He already established with l  nephrology and vascular surgery, AV fistula creation was postponed due to recent osteomyelitis. High cholesterol: Continue on Pravachol, no recent FLP.  We will get that the next opportunity Anemia: Stable, last hemoglobin 11.1. Recommend a flu shot earlier this season RTC 3 to 4 months in person if possible.

## 2019-04-26 NOTE — Progress Notes (Signed)
Subjective:    Patient ID: Louis Bradley, male    DOB: 02/07/1946, 73 y.o.   MRN: [id number]  DOS:  04/26/2019 Type of visit - description: Virtual Visit via Video Note  I connected with@ on 04/26/19 at 10:40 AM EDT by a video enabled telemedicine application and verified that I am speaking with the correct person using two identifiers.   THIS ENCOUNTER IS A VIRTUAL VISIT DUE TO COVID-19 - PATIENT WAS NOT SEEN IN THE OFFICE. PATIENT HAS CONSENTED TO VIRTUAL VISIT / TELEMEDICINE VISIT   Location of patient: home  Location of provider: office  I discussed the limitations of evaluation and management by telemedicine and the availability of in person appointments. The patient expressed understanding and agreed to proceed.  History of Present Illness: Follow-up from last office visit Since the last office visit, he had  2 toes amputated.  Had a follow-up with Dr. Sharol Given office yesterday and he is healing well.  Currently taking no antibiotics CKD: He already is already followed by nephrology and vascular surgery. HTN: Good compliance with medications, ambulatory BPs typically good in the 120s over 60s.    BP Readings from Last 3 Encounters:  04/20/19 138/63  04/16/19 (!) 148/64  04/04/19 140/71    Review of Systems He feels well, no fever chills No chest pain no difficulty breathing  Past Medical History:  Diagnosis Date  . Anemia   . Anesthesia complication    General anesthesia makes me feel foggy for long periods of time  . Arthritis   . CKD (chronic kidney disease)    Stage 4-5  . Complication of anesthesia    slow to come out of anesthesia - a lot of confusion   . Diabetes ([place])   . Fournier gangrene   . Heart murmur    Born with, no problems  . History of blood transfusion    x2  . Hyperlipidemia   . Hypertension   . OSA on CPAP   . Osteomyelitis ([place]) 2016   L toe  . Renal atrophy, left   . Vitamin D deficiency     Past Surgical History:  Procedure  Laterality Date  . AMPUTATION TOE Left 04/20/2019   Procedure: AMPUTATION LEFT SECOND AND THIRD TOES;  Surgeon: Newt Minion, MD;  Location: [facility];  Service: Orthopedics;  Laterality: Left;  . COLONOSCOPY    . COLOSTOMY  03/2015  . COLOSTOMY REVERSAL    . TOE AMPUTATION Left 2016   Left big toe amputated due to MRSA infection    Social History   Socioeconomic History  . Marital status: Married    Spouse name: Not on file  . Number of children: 2  . Years of education: Not on file  . Highest education level: Not on file  Occupational History  . Occupation: retired-owned a Environmental manager   . Occupation: retired- Theatre manager  Social Needs  . Financial resource strain: Not on file  . Food insecurity    Worry: Not on file    Inability: Not on file  . Transportation needs    Medical: Not on file    Non-medical: Not on file  Tobacco Use  . Smoking status: Former Research scientist (life sciences)  . Smokeless tobacco: Never Used  . Tobacco comment: quit 12/2018 (1 ppd)  Substance and Sexual Activity  . Alcohol use: Yes    Comment: social  . Drug use: Never  . Sexual activity: Not on file  Lifestyle  . Physical activity  Days per week: Not on file    Minutes per session: Not on file  . Stress: Not on file  Relationships  . Social Herbalist on phone: Not on file    Gets together: Not on file    Attends religious service: Not on file    Active member of club or organization: Not on file    Attends meetings of clubs or organizations: Not on file    Relationship status: Not on file  . Intimate partner violence    Fear of current or ex partner: Not on file    Emotionally abused: Not on file    Physically abused: Not on file    Forced sexual activity: Not on file  Other Topics Concern  . Not on file  Social History Narrative   Patient and his wife moved from [place] on 02/14/2021 live with his daughter      Allergies as of 04/26/2019   No Known Allergies     Medication List        Accurate as of April 26, 2019 10:56 AM. If you have any questions, ask your nurse or doctor.        amLODipine 10 MG tablet Commonly known as: NORVASC Take 1 tablet (10 mg total) by mouth daily.   aspirin 325 MG tablet Take 325 mg by mouth every evening.   calcitRIOL 0.25 MCG capsule Commonly known as: ROCALTROL Take 0.25 mcg by mouth every Monday, Wednesday, and Friday.   carvedilol 25 MG tablet Commonly known as: COREG Take 1 tablet (25 mg total) by mouth 2 (two) times daily with a meal.   doxycycline 100 MG tablet Commonly known as: VIBRA-TABS Take 1 tablet (100 mg total) by mouth 2 (two) times daily.   furosemide 40 MG tablet Commonly known as: LASIX Take 2 tablets (80 mg total) by mouth daily.   glipiZIDE 5 MG tablet Commonly known as: GLUCOTROL Take 2 tablets (10 mg total) by mouth 2 (two) times daily.   HYDROcodone-acetaminophen 5-325 MG tablet Commonly known as: NORCO/VICODIN Take 1 tablet by mouth every 4 (four) hours as needed.   mupirocin ointment 2 % Commonly known as: Bactroban Place 1 application into the nose 2 (two) times daily. What changed:   how to take this  additional instructions   nicotine polacrilex 4 MG lozenge Commonly known as: COMMIT Take 4 mg by mouth as needed for smoking cessation.   pravastatin 40 MG tablet Commonly known as: PRAVACHOL Take 1 tablet (40 mg total) by mouth at bedtime.   PROBIOTIC DAILY PO Take 1 tablet by mouth every Monday, Wednesday, and Friday.   sodium bicarbonate 650 MG tablet Take 650 mg by mouth daily.   sodium zirconium cyclosilicate 10 g Pack packet Commonly known as: Lokelma Take 10 g by mouth daily. Use as directed by nephrology What changed: when to take this           Objective:   Physical Exam There were no vitals taken for this visit. This is a virtual video visit.  Alert oriented x3, no apparent distress    Assessment      Assessment (new patient/2020, referred by his daughter)  DM HTN Hyperlipidemia CKD: HD fistula planned ~ 11/2018, cancelled Vitamin D deficiency Aortic valve stenosis: Next echo 2020 OSA, on CPAP H/o osteomyelitis status post left toe amputation H/o gangrene, buttocks, had temporarily a colostomy Former smoker, quit 12-2018 Anemia: Seen at the ER with fatigue, hemoglobin of 8.6 08-2018:  2 PRBCs, had a EGD and colonoscopy   PLAN: Osteomyelitis,Status post amputation of the left second and third toes.  Recovering well per Bobette Mo checkup yesterday. Of note is, after the last visit with me, he saw Dr. Lorelei Pont, on  exam that he had good pedal pulses. CKD: He already established with l  nephrology and vascular surgery, AV fistula creation was postponed due to recent osteomyelitis. High cholesterol: Continue on Pravachol, no recent FLP.  We will get that the next opportunity Anemia: Stable, last hemoglobin 11.1. Recommend a flu shot earlier this season RTC 3 to 4 months in person if possible.  I discussed the assessment and treatment plan with the patient. The patient was provided an opportunity to ask questions and all were answered. The patient agreed with the plan and demonstrated an understanding of the instructions.   The patient was advised to call back or seek an in-person evaluation if the symptoms worsen or if the condition fails to improve as anticipated.

## 2019-04-27 ENCOUNTER — Telehealth: Payer: Self-pay

## 2019-04-27 NOTE — Telephone Encounter (Signed)
Submitted VOB for Gel-One, bilateral knee.  

## 2019-05-02 ENCOUNTER — Ambulatory Visit (INDEPENDENT_AMBULATORY_CARE_PROVIDER_SITE_OTHER): Payer: Medicare Other | Admitting: Family

## 2019-05-02 ENCOUNTER — Telehealth: Payer: Self-pay

## 2019-05-02 ENCOUNTER — Other Ambulatory Visit: Payer: Self-pay

## 2019-05-02 ENCOUNTER — Encounter: Payer: Self-pay | Admitting: Family

## 2019-05-02 VITALS — Ht 75.0 in | Wt 300.0 lb

## 2019-05-02 DIAGNOSIS — B351 Tinea unguium: Secondary | ICD-10-CM | POA: Insufficient documentation

## 2019-05-02 DIAGNOSIS — Z89422 Acquired absence of other left toe(s): Secondary | ICD-10-CM

## 2019-05-02 DIAGNOSIS — I872 Venous insufficiency (chronic) (peripheral): Secondary | ICD-10-CM | POA: Insufficient documentation

## 2019-05-02 DIAGNOSIS — S98132A Complete traumatic amputation of one left lesser toe, initial encounter: Secondary | ICD-10-CM | POA: Insufficient documentation

## 2019-05-02 NOTE — Telephone Encounter (Signed)
Patient is approved for Gel-One, bilateral knee. [place] Covered through his insurance. No Co-Pay No PA required  Appt. 05/09/2019

## 2019-05-02 NOTE — Progress Notes (Signed)
Office Visit Note   Patient: Louis Bradley           Date of Birth: 1946-02-10           MRN: [id number] Visit Date: 05/02/2019              Requested by: Colon Branch, [address] [place],  [place] [address] PCP: Colon Branch, MD  Chief Complaint  Patient presents with  . Left Foot - Routine Post Op    04/20/2019 left foot 2nd and 3rd toe amputation       HPI: Patient is a 73 year old gentleman who is seen in follow-up for his left foot status post second and third toe amputation he is about 12 days out from surgery.  Patient also has chronic venous insufficiency and onychomycotic nails which he cannot trim on his own.  Assessment & Plan: Visit Diagnoses:  1. Amputated toe, left ([place])   2. Onychomycosis   3. Venous insufficiency of both lower extremities     Plan: Recommended patient obtain a pair of extra-large 15 to 20 mm compression stockings to be worn daily.  Nails were trimmed without complication.  Follow-Up Instructions: Return in about 1 week (around 05/09/2019).   Ortho Exam  Patient is alert, oriented, no adenopathy, well-dressed, normal affect, normal respiratory effort. Examination patient ambulates in a motorized scooter.  The surgical incision left foot is healing quite nicely no drainage no cellulitis no signs of infection.  He does have onychomycotic nails he is unable to safely trim these on his own and nails were trimmed without complication.  Calf measures 40 cm in circumference.  Imaging: No results found. No images are attached to the encounter.  Labs: No results found for: HGBA1C, ESRSEDRATE, CRP, LABURIC, REPTSTATUS, GRAMSTAIN, CULT, LABORGA   No results found for: ALBUMIN, PREALBUMIN, LABURIC  Body mass index is 37.5 kg/m.  Orders:  No orders of the defined types were placed in this encounter.  No orders of the defined types were placed in this encounter.    Procedures: No procedures performed  Clinical Data: No  additional findings.  ROS:  All other systems negative, except as noted in the HPI. Review of Systems  Objective: Vital Signs: Ht 6\' 3"  (2.119 m)   Wt 300 lb (136.1 kg)   BMI 37.50 kg/m   Specialty Comments:  No specialty comments available.  PMFS History: Patient Active Problem List   Diagnosis Date Noted  . Amputated toe, left ([place]) 05/02/2019  . Onychomycosis 05/02/2019  . Venous insufficiency of both lower extremities 05/02/2019  . Hyperlipidemia associated with type 2 diabetes mellitus ([place]) 04/26/2019  . Osteomyelitis of third toe of left foot ([place]) 04/17/2019  . Osteomyelitis of second toe of left foot ([place]) 04/17/2019  . Bilateral primary osteoarthritis of knee 04/17/2019  . PCP NOTES >>>>>>>>>>>>>>> 03/01/2019  . Aortic stenosis 12/18/2018  . Iron deficiency anemia 09/09/2018  . S/P colostomy ([place]) 09/05/2016  . Vitamin D deficiency 04/26/2016  . Chronic renal failure in pediatric patient, stage 4 (severe) ([place]) 04/09/2015  . Diabetes mellitus type 2 with complications ([place]) 41/74/0814  . Essential hypertension 04/08/2015  . Hyponatremia 04/08/2015  . Hyperkalemia 05/22/2012   Past Medical History:  Diagnosis Date  . Anemia   . Anesthesia complication    General anesthesia makes me feel foggy for long periods of time  . Arthritis   . CKD (chronic kidney disease)    Stage 4-5  .  Complication of anesthesia    slow to come out of anesthesia - a lot of confusion   . Diabetes ([place])   . Fournier gangrene   . Heart murmur    Born with, no problems  . History of blood transfusion    x2  . Hyperlipidemia   . Hypertension   . OSA on CPAP   . Osteomyelitis ([place]) 2016   L toe  . Renal atrophy, left   . Vitamin D deficiency     Family History  Problem Relation Age of Onset  . CAD Father 10  . CAD Other        2 uncles had heart attacks  . Colon cancer Neg Hx   . Prostate cancer Neg Hx     Past Surgical History:  Procedure Laterality Date  .  AMPUTATION TOE Left 04/20/2019   Procedure: AMPUTATION LEFT SECOND AND THIRD TOES;  Surgeon: Newt Minion, MD;  Location: [facility];  Service: Orthopedics;  Laterality: Left;  . COLONOSCOPY    . COLOSTOMY  03/2015  . COLOSTOMY REVERSAL    . TOE AMPUTATION Left 2016   Left big toe amputated due to MRSA infection   Social History   Occupational History  . Occupation: retired-owned a Environmental manager   . Occupation: retired- Theatre manager  Tobacco Use  . Smoking status: Former Research scientist (life sciences)  . Smokeless tobacco: Never Used  . Tobacco comment: quit 12/2018 (1 ppd)  Substance and Sexual Activity  . Alcohol use: Yes    Comment: social  . Drug use: Never  . Sexual activity: Not on file

## 2019-05-07 ENCOUNTER — Telehealth: Payer: Self-pay | Admitting: Radiology

## 2019-05-07 NOTE — Telephone Encounter (Signed)
FYI... Received call from [facility] in regards to knee injections patient is going to get. I advised Gel One Injections for bilateral knees has been approved. She states that physician there would like to be sure that you know he should not have ANY NSAIDS, including any injections of NSAIDs.

## 2019-05-07 NOTE — Telephone Encounter (Signed)
Please see below. Pt has been approved for gel injections and Nephrology called and lm

## 2019-05-09 ENCOUNTER — Ambulatory Visit: Payer: Medicare Other | Admitting: Family

## 2019-05-10 ENCOUNTER — Emergency Department (HOSPITAL_COMMUNITY): Payer: Medicare Other

## 2019-05-10 ENCOUNTER — Telehealth: Payer: Self-pay | Admitting: Internal Medicine

## 2019-05-10 ENCOUNTER — Other Ambulatory Visit: Payer: Self-pay

## 2019-05-10 ENCOUNTER — Encounter (HOSPITAL_COMMUNITY): Payer: Self-pay | Admitting: Emergency Medicine

## 2019-05-10 ENCOUNTER — Ambulatory Visit: Payer: Self-pay

## 2019-05-10 ENCOUNTER — Observation Stay (HOSPITAL_COMMUNITY)
Admission: EM | Admit: 2019-05-10 | Discharge: 2019-05-11 | Disposition: A | Discharge status: Home / Self Care | Payer: Medicare Other | Source: Home / Self Care | Attending: Emergency Medicine | Admitting: Emergency Medicine

## 2019-05-10 DIAGNOSIS — N19 Unspecified kidney failure: Secondary | ICD-10-CM | POA: Diagnosis not present

## 2019-05-10 DIAGNOSIS — I350 Nonrheumatic aortic (valve) stenosis: Secondary | ICD-10-CM

## 2019-05-10 DIAGNOSIS — G4733 Obstructive sleep apnea (adult) (pediatric): Secondary | ICD-10-CM | POA: Insufficient documentation

## 2019-05-10 DIAGNOSIS — Z7984 Long term (current) use of oral hypoglycemic drugs: Secondary | ICD-10-CM | POA: Insufficient documentation

## 2019-05-10 DIAGNOSIS — I10 Essential (primary) hypertension: Secondary | ICD-10-CM | POA: Diagnosis present

## 2019-05-10 DIAGNOSIS — D62 Acute posthemorrhagic anemia: Secondary | ICD-10-CM | POA: Insufficient documentation

## 2019-05-10 DIAGNOSIS — E118 Type 2 diabetes mellitus with unspecified complications: Secondary | ICD-10-CM | POA: Diagnosis present

## 2019-05-10 DIAGNOSIS — Z1159 Encounter for screening for other viral diseases: Secondary | ICD-10-CM | POA: Insufficient documentation

## 2019-05-10 DIAGNOSIS — Z933 Colostomy status: Secondary | ICD-10-CM | POA: Insufficient documentation

## 2019-05-10 DIAGNOSIS — Z89422 Acquired absence of other left toe(s): Secondary | ICD-10-CM | POA: Insufficient documentation

## 2019-05-10 DIAGNOSIS — K922 Gastrointestinal hemorrhage, unspecified: Secondary | ICD-10-CM | POA: Insufficient documentation

## 2019-05-10 DIAGNOSIS — A419 Sepsis, unspecified organism: Secondary | ICD-10-CM | POA: Diagnosis not present

## 2019-05-10 DIAGNOSIS — I080 Rheumatic disorders of both mitral and aortic valves: Secondary | ICD-10-CM | POA: Insufficient documentation

## 2019-05-10 DIAGNOSIS — N184 Chronic kidney disease, stage 4 (severe): Secondary | ICD-10-CM | POA: Insufficient documentation

## 2019-05-10 DIAGNOSIS — Z7982 Long term (current) use of aspirin: Secondary | ICD-10-CM | POA: Insufficient documentation

## 2019-05-10 DIAGNOSIS — K921 Melena: Secondary | ICD-10-CM

## 2019-05-10 DIAGNOSIS — I129 Hypertensive chronic kidney disease with stage 1 through stage 4 chronic kidney disease, or unspecified chronic kidney disease: Secondary | ICD-10-CM | POA: Insufficient documentation

## 2019-05-10 DIAGNOSIS — S98132A Complete traumatic amputation of one left lesser toe, initial encounter: Secondary | ICD-10-CM | POA: Diagnosis present

## 2019-05-10 DIAGNOSIS — E1122 Type 2 diabetes mellitus with diabetic chronic kidney disease: Secondary | ICD-10-CM | POA: Insufficient documentation

## 2019-05-10 DIAGNOSIS — Z79899 Other long term (current) drug therapy: Secondary | ICD-10-CM | POA: Insufficient documentation

## 2019-05-10 DIAGNOSIS — D649 Anemia, unspecified: Secondary | ICD-10-CM

## 2019-05-10 LAB — CBC
HCT: 18.5 % — ABNORMAL LOW (ref 39.0–52.0)
Hemoglobin: 5.7 g/dL — CL (ref 13.0–17.0)
MCH: 28.6 pg (ref 26.0–34.0)
MCHC: 30.8 g/dL (ref 30.0–36.0)
MCV: 93 fL (ref 80.0–100.0)
Platelets: 215 10*3/uL (ref 150–400)
RBC: 1.99 MIL/uL — ABNORMAL LOW (ref 4.22–5.81)
RDW: 14.9 % (ref 11.5–15.5)
WBC: 14.3 10*3/uL — ABNORMAL HIGH (ref 4.0–10.5)
nRBC: 0 % (ref 0.0–0.2)

## 2019-05-10 LAB — PROTIME-INR
INR: 1.1 (ref 0.8–1.2)
Prothrombin Time: 13.6 seconds (ref 11.4–15.2)

## 2019-05-10 LAB — BASIC METABOLIC PANEL
Anion gap: 8 (ref 5–15)
BUN: 115 mg/dL — ABNORMAL HIGH (ref 8–23)
CO2: 22 mmol/L (ref 22–32)
Calcium: 8.1 mg/dL — ABNORMAL LOW (ref 8.9–10.3)
Chloride: 105 mmol/L (ref 98–111)
Creatinine, Ser: 5.24 mg/dL — ABNORMAL HIGH (ref 0.61–1.24)
GFR calc Af Amer: 12 mL/min — ABNORMAL LOW (ref >60)
GFR calc non Af Amer: 10 mL/min — ABNORMAL LOW (ref >60)
Glucose, Bld: 176 mg/dL — ABNORMAL HIGH (ref 70–99)
Potassium: 5.2 mmol/L — ABNORMAL HIGH (ref 3.5–5.1)
Sodium: 135 mmol/L (ref 135–145)

## 2019-05-10 LAB — GLUCOSE, CAPILLARY: Glucose-Capillary: 124 mg/dL — ABNORMAL HIGH (ref 70–99)

## 2019-05-10 LAB — HEPATIC FUNCTION PANEL
ALT: 19 U/L (ref 0–44)
AST: 18 U/L (ref 15–41)
Albumin: 3.2 g/dL — ABNORMAL LOW (ref 3.5–5.0)
Alkaline Phosphatase: 52 U/L (ref 38–126)
Bilirubin, Direct: <0.1 mg/dL (ref 0.0–0.2)
Total Bilirubin: 0.5 mg/dL (ref 0.3–1.2)
Total Protein: 6.1 g/dL — ABNORMAL LOW (ref 6.5–8.1)

## 2019-05-10 LAB — MAGNESIUM: Magnesium: 3 mg/dL — ABNORMAL HIGH (ref 1.7–2.4)

## 2019-05-10 LAB — ABO/RH: ABO/RH(D): O POS

## 2019-05-10 LAB — BRAIN NATRIURETIC PEPTIDE: B Natriuretic Peptide: 1144.8 pg/mL — ABNORMAL HIGH (ref 0.0–100.0)

## 2019-05-10 LAB — TROPONIN I (HIGH SENSITIVITY): Troponin I (High Sensitivity): 1542 ng/L (ref <18)

## 2019-05-10 LAB — CBG MONITORING, ED: Glucose-Capillary: 165 mg/dL — ABNORMAL HIGH (ref 70–99)

## 2019-05-10 LAB — POC OCCULT BLOOD, ED: Fecal Occult Bld: POSITIVE — AB

## 2019-05-10 LAB — PREPARE RBC (CROSSMATCH)

## 2019-05-10 MED ORDER — SODIUM ZIRCONIUM CYCLOSILICATE 10 G PO PACK
10.0000 g | PACK | ORAL | Status: DC
  Filled 2019-05-10: qty 1

## 2019-05-10 MED ORDER — INSULIN ASPART 100 UNIT/ML ~~LOC~~ SOLN
0.0000 [IU] | SUBCUTANEOUS | Status: DC
  Administered 2019-05-10 – 2019-05-11 (×2): 1 [IU] via SUBCUTANEOUS

## 2019-05-10 MED ORDER — ACETAMINOPHEN 325 MG PO TABS: 650.0000 mg | ORAL_TABLET | Freq: Four times a day (QID) | ORAL | Status: DC | PRN

## 2019-05-10 MED ORDER — LACTATED RINGERS IV BOLUS
500.0000 mL | Freq: Once | INTRAVENOUS | Status: AC
  Administered 2019-05-10: 500 mL via INTRAVENOUS

## 2019-05-10 MED ORDER — CALCITRIOL 0.25 MCG PO CAPS: 0.2500 ug | ORAL_CAPSULE | ORAL | Status: DC

## 2019-05-10 MED ORDER — PANTOPRAZOLE SODIUM 40 MG IV SOLR
40.0000 mg | Freq: Once | INTRAVENOUS | Status: AC
  Administered 2019-05-10: 40 mg via INTRAVENOUS
  Filled 2019-05-10: qty 40

## 2019-05-10 MED ORDER — SODIUM BICARBONATE 650 MG PO TABS
650.0000 mg | ORAL_TABLET | Freq: Every day | ORAL | Status: DC
  Filled 2019-05-10: qty 1

## 2019-05-10 MED ORDER — PANTOPRAZOLE SODIUM 40 MG IV SOLR: 40.0000 mg | Freq: Two times a day (BID) | INTRAVENOUS | Status: DC

## 2019-05-10 MED ORDER — SODIUM CHLORIDE 0.9% FLUSH: 3.0000 mL | Freq: Once | INTRAVENOUS | Status: DC

## 2019-05-10 MED ORDER — SODIUM CHLORIDE 0.9 % IV SOLN
8.0000 mg/h | INTRAVENOUS | Status: DC
  Filled 2019-05-10: qty 80

## 2019-05-10 MED ORDER — ACETAMINOPHEN 650 MG RE SUPP: 650.0000 mg | Freq: Four times a day (QID) | RECTAL | Status: DC | PRN

## 2019-05-10 NOTE — ED Notes (Signed)
Charge 5W to call back to inform nurse if pt is appropriate for floor d/t critical troponin.

## 2019-05-10 NOTE — Telephone Encounter (Signed)
Patient was NT today 6/25.  Patient was advised to go to ER. Ambulance came and wife states the crew told him there was no reason to take him to the ER.  I express to wife that patient was advised to go to ER by PCP.  Patient and wife stated they really did not want to go to ER, that patient just needed to get seen by PCP and get blood work done. Patient and wife was under the impression that patient would get another call back from NT.  Sent a clinical call as well. Patient call back [phone]

## 2019-05-10 NOTE — Telephone Encounter (Signed)
Patient's wife called to discuss the below call, she says she's calling [phone] because her husband just fell. I advised to go ahead and call.     Summary: FU pt COVID sym   Pt is having COVID sym. Exteremly tired, too tired too eat, today cold- like issues, upper respiratory problems, recent in hospital to have 2 toes amputated, at that time test for covid but hoping did not contact twhile in hospital Pls reach out ASAP to wife Lorin at [phone] her husband is very weak!

## 2019-05-10 NOTE — Telephone Encounter (Signed)
FYI

## 2019-05-10 NOTE — ED Provider Notes (Signed)
[facility] EMERGENCY DEPARTMENT Provider Note   CSN: [id number] Arrival date & time: 05/10/19  1815    History   Chief Complaint Chief Complaint  Patient presents with   Dizziness   Fall    HPI Louis Bradley is a 73 y.o. male.     The history is provided by the patient, medical records and the EMS personnel.  Dizziness Quality:  Lightheadedness Severity:  Moderate Onset quality:  Gradual Duration:  4 days Timing:  Constant Progression:  Worsening Chronicity:  Recurrent Context: not when bending over, not with bowel movement, not with head movement, not with inactivity, not with loss of consciousness, not with physical activity and not when standing up   Relieved by:  Nothing Worsened by:  Movement Ineffective treatments:  Lying down, food, fluids, change in position and being still Associated symptoms: weakness   Associated symptoms: no blood in stool, no chest pain, no diarrhea, no headaches, no palpitations, no shortness of breath, no vision changes and no vomiting   Risk factors: anemia   Risk factors: no hx of vertigo and no Meniere's disease   Fall Pertinent negatives include no chest pain, no headaches and no shortness of breath.    Past Medical History:  Diagnosis Date   Anemia    Anesthesia complication    General anesthesia makes me feel foggy for long periods of time   Arthritis    CKD (chronic kidney disease)    Stage 4-5   Complication of anesthesia    slow to come out of anesthesia - a lot of confusion    Diabetes ([place])    Fournier gangrene    Heart murmur    Born with, no problems   History of blood transfusion    x2   Hyperlipidemia    Hypertension    OSA on CPAP    Osteomyelitis ([place]) 2016   L toe   Renal atrophy, left    Vitamin D deficiency     Patient Active Problem List   Diagnosis Date Noted   Acute GI bleeding 05/10/2019   Acute blood loss anemia 05/10/2019   Amputated toe, left ([place])  05/02/2019   Onychomycosis 05/02/2019   Venous insufficiency of both lower extremities 05/02/2019   Hyperlipidemia associated with type 2 diabetes mellitus ([place]) 04/26/2019   Osteomyelitis of third toe of left foot ([place]) 04/17/2019   Osteomyelitis of second toe of left foot ([place]) 04/17/2019   Bilateral primary osteoarthritis of knee 04/17/2019   PCP NOTES >>>>>>>>>>>>>>> 03/01/2019   Aortic stenosis 12/18/2018   Iron deficiency anemia 09/09/2018   S/P colostomy ([place]) 09/05/2016   Vitamin D deficiency 04/26/2016   Chronic renal failure in pediatric patient, stage 4 (severe) ([place]) 04/09/2015   Diabetes mellitus type 2 with complications ([place]) 02/77/4128   Essential hypertension 04/08/2015   Hyponatremia 04/08/2015   Hyperkalemia 05/22/2012    Past Surgical History:  Procedure Laterality Date   AMPUTATION TOE Left 04/20/2019   Procedure: AMPUTATION LEFT SECOND AND THIRD TOES;  Surgeon: Newt Minion, MD;  Location: [facility];  Service: Orthopedics;  Laterality: Left;   COLONOSCOPY     COLOSTOMY  03/2015   COLOSTOMY REVERSAL     TOE AMPUTATION Left 2016   Left big toe amputated due to MRSA infection        Home Medications    Prior to Admission medications   Medication Sig Start Date End Date Taking? Authorizing Provider  amLODipine (NORVASC) 10 MG tablet Take  1 tablet (10 mg total) by mouth daily. 03/13/19  Yes Colon Branch, MD  aspirin 325 MG tablet Take 325 mg by mouth every evening.    Yes [provider]  calcitRIOL (ROCALTROL) 0.25 MCG capsule Take 0.25 mcg by mouth every Monday, Wednesday, and Friday.  03/22/19  Yes [provider]  carvedilol (COREG) 25 MG tablet Take 1 tablet (25 mg total) by mouth 2 (two) times daily with a meal. Patient taking differently: Take 25 mg by mouth 2 (two) times a day.  03/13/19  Yes Paz, Alda Berthold, MD  furosemide (LASIX) 40 MG tablet Take 2 tablets (80 mg total) by mouth daily. 03/13/19  Yes Paz, Alda Berthold, MD    glipiZIDE (GLUCOTROL) 5 MG tablet Take 2 tablets (10 mg total) by mouth 2 (two) times daily. 04/20/19  Yes Paz, Alda Berthold, MD  nicotine polacrilex (COMMIT) 4 MG lozenge Take 4 mg by mouth as needed for smoking cessation.   Yes [provider]  pravastatin (PRAVACHOL) 40 MG tablet Take 1 tablet (40 mg total) by mouth at bedtime. 03/13/19  Yes Colon Branch, MD  Probiotic Product (PROBIOTIC DAILY PO) Take 1 tablet by mouth every Monday, Wednesday, and Friday.    Yes [provider]  sodium bicarbonate 650 MG tablet Take 650 mg by mouth daily. 03/20/19  Yes [provider]  sodium zirconium cyclosilicate (LOKELMA) 10 g PACK packet Take 10 g by mouth daily. Use as directed by nephrology Patient taking differently: Take 10 g by mouth every Monday, Wednesday, and Friday. Use as directed by nephrology 04/16/19  Yes Copland, Gay Filler, MD    Family History Family History  Problem Relation Age of Onset   CAD Father 93   CAD Other        2 uncles had heart attacks   Colon cancer Neg Hx    Prostate cancer Neg Hx     Social History Social History   Tobacco Use   Smoking status: Former Smoker   Smokeless tobacco: Never Used   Tobacco comment: quit 12/2018 (1 ppd)  Substance Use Topics   Alcohol use: Yes    Comment: social   Drug use: Never     Allergies   Patient has no known allergies.   Review of Systems Review of Systems  Respiratory: Negative for shortness of breath.   Cardiovascular: Negative for chest pain and palpitations.  Gastrointestinal: Negative for blood in stool, diarrhea and vomiting.  Neurological: Positive for dizziness and weakness. Negative for headaches.  All other systems reviewed and are negative.    Physical Exam Updated Vital Signs BP 111/61    Pulse (!) 59    Temp 98 F (36.7 C) (Oral)    Resp (!) 22    Ht 6\' 3"  (1.905 m)    Wt 131.5 kg    SpO2 94%    BMI 36.25 kg/m   Physical Exam Vitals signs and nursing note reviewed.   Constitutional:      Appearance: He is well-developed.  HENT:     Head: Normocephalic and atraumatic.     Mouth/Throat:     Mouth: Mucous membranes are dry.  Eyes:     Conjunctiva/sclera: Conjunctivae normal.  Neck:     Musculoskeletal: Neck supple. No muscular tenderness.  Cardiovascular:     Rate and Rhythm: Normal rate.     Heart sounds: No murmur.  Pulmonary:     Effort: Pulmonary effort is normal. No respiratory distress.  Breath sounds: No stridor. No wheezing or rhonchi.  Abdominal:     Palpations: Abdomen is soft.     Tenderness: There is no abdominal tenderness.  Genitourinary:    Rectum: Normal. Guaiac result positive.     Comments: No gross blood per rectum Musculoskeletal:     Right lower leg: Edema present.     Left lower leg: Edema present.     Comments: 2+ pitting edema bilateral lower extremities  Toe amputations left lower extremity, wound clean dry and intact  Skin:    General: Skin is warm and dry.     Capillary Refill: Capillary refill takes more than 3 seconds.     Coloration: Skin is pale.     Findings: No rash.  Neurological:     Mental Status: He is alert.      ED Treatments / Results  Labs (all labs ordered are listed, but only abnormal results are displayed) Labs Reviewed  BASIC METABOLIC PANEL - Abnormal; Notable for the following components:      Result Value   Potassium 5.2 (*)    Glucose, Bld 176 (*)    BUN 115 (*)    Creatinine, Ser 5.24 (*)    Calcium 8.1 (*)    GFR calc non Af Amer 10 (*)    GFR calc Af Amer 12 (*)    All other components within normal limits  CBC - Abnormal; Notable for the following components:   WBC 14.3 (*)    RBC 1.99 (*)    Hemoglobin 5.7 (*)    HCT 18.5 (*)    All other components within normal limits  HEPATIC FUNCTION PANEL - Abnormal; Notable for the following components:   Total Protein 6.1 (*)    Albumin 3.2 (*)    All other components within normal limits  TROPONIN I (HIGH SENSITIVITY) -  Abnormal; Notable for the following components:   Troponin I (High Sensitivity) 1,542 (*)    All other components within normal limits  BRAIN NATRIURETIC PEPTIDE - Abnormal; Notable for the following components:   B Natriuretic Peptide 1,144.8 (*)    All other components within normal limits  MAGNESIUM - Abnormal; Notable for the following components:   Magnesium 3.0 (*)    All other components within normal limits  CBG MONITORING, ED - Abnormal; Notable for the following components:   Glucose-Capillary 165 (*)    All other components within normal limits  POC OCCULT BLOOD, ED - Abnormal; Notable for the following components:   Fecal Occult Bld POSITIVE (*)    All other components within normal limits  NOVEL CORONAVIRUS, NAA (HOSPITAL ORDER, SEND-OUT TO REF LAB)  PROTIME-INR  URINALYSIS, ROUTINE W REFLEX MICROSCOPIC  TROPONIN I (HIGH SENSITIVITY)  TYPE AND SCREEN  PREPARE RBC (CROSSMATCH)  ABO/RH    EKG EKG Interpretation  Date/Time:  Thursday May 10 2019 18:19:14 EDT Ventricular Rate:  60 PR Interval:  166 QRS Duration: 100 QT Interval:  440 QTC Calculation: 440 R Axis:   13 Text Interpretation:  Normal sinus rhythm Left ventricular hypertrophy with repolarization abnormality Abnormal ECG Confirmed by Gerlene Fee [phone]) on 05/10/2019 6:42:08 PM   Radiology Dg Chest Portable 1 View  Result Date: 05/10/2019 CLINICAL DATA:  Dizziness. EXAM: PORTABLE CHEST 1 VIEW COMPARISON:  None. FINDINGS: 1919 hours. Low lung volumes. Interstitial markings are diffusely coarsened with chronic features. Bibasilar patchy airspace disease suggests atelectasis or infiltrate. No substantial pleural effusion. Cardiopericardial silhouette is at upper limits of normal for size.  Bones are diffusely demineralized. Telemetry leads overlie the chest. IMPRESSION: Low lung volumes with bibasilar atelectasis or infiltrate. Electronically Signed   By: Misty Stanley M.D.   On: 05/10/2019 20:09     Procedures Procedures (including critical care time)  Medications Ordered in ED Medications  pantoprazole (PROTONIX) 80 mg in sodium chloride 0.9 % 250 mL (0.32 mg/mL) infusion (has no administration in time range)  pantoprazole (PROTONIX) injection 40 mg (has no administration in time range)  lactated ringers bolus 500 mL (0 mLs Intravenous Stopped 05/10/19 2001)  pantoprazole (PROTONIX) injection 40 mg (40 mg Intravenous Given 05/10/19 1924)     Initial Impression / Assessment and Plan / ED Course  I have reviewed the triage vital signs and the nursing notes.  Pertinent labs & imaging results that were available during my care of the patient were reviewed by me and considered in my medical decision making (see chart for details).        Medical Decision Making: WILMAN TUCKER is a 73 y.o. male who presented to the ED today with dizziness, fatigue.  Past medical history significant for diabetes, hyperlipidemia, hypertension, obstructive sleep apnea, osteomyelitis, CKD Reviewed and confirmed nursing documentation for past medical history, family history, social history.  On my initial exam, the pt was appears fatigued, not tachycardic, blood pressure 107/58, afebrile, no increased work of breathing or respiratory distress, patient is satting 91% on room air, 2 L nasal cannula placed for comfort.  GCS 15.  History of symptomatic anemia, never followed up with GI, worsening fatigue with feeling of lightheadedness over the last few days.  Stool guaiac positive without any gross blood, stool is melanotic.  IV PPI given, gastroenterology consulted.  Patient consented, typed and screened.  Hemoglobin 5.7.  EKG (my interpretation):      NS rhythm with a rate of  60.      QRS 100. QTc 440. PR 166.      Nonspecific ST segment changes with T wave inversions in 2, 3, aVF, V3 through V6, nonspecific ST segment changes in lead I      No active chest pain the past few days, no active chest  pain in ED, no diaphoresis      No acute changes suggestive of hyperkalemia.      No WPW, LQTS, or Brugada's Syndrome.      No prior EKG for comparisons  We will send troponin testing, as needed repeat EKG, reassess after anemia treated  High sensitive troponin ~1500 Reassessed patient, grossly asymptomatic other than feeling fatigued, no chest pain, no shortness of breath, no diaphoresis Discussed with hospitalist, given bleeding risk we will not anticoagulate with heparin or give aspirin, will continue to trend and reassess patient INR 1.1 All radiology and laboratory studies reviewed independently and with my attending physician, agree with reading provided by radiologist unless otherwise noted.  Upon reassessing patient, patient was calm, resting comfortably. No new complaints Based on the above findings, I believe patient requires admission.  The above care was discussed with and agreed upon by my attending physician. Emergency Department Medication Summary:  Medications  pantoprazole (PROTONIX) 80 mg in sodium chloride 0.9 % 250 mL (0.32 mg/mL) infusion (has no administration in time range)  pantoprazole (PROTONIX) injection 40 mg (has no administration in time range)  lactated ringers bolus 500 mL (0 mLs Intravenous Stopped 05/10/19 2001)  pantoprazole (PROTONIX) injection 40 mg (40 mg Intravenous Given 05/10/19 1924)   Final Clinical Impressions(s) / ED Diagnoses  Final diagnoses:  Gastrointestinal hemorrhage with melena  Anemia, unspecified type    ED Discharge Orders    None       Louis Candy, MD 05/10/19 2136    Louis Biles, MD 05/11/19 [phone]

## 2019-05-10 NOTE — ED Notes (Signed)
Critical Troponin. Dr. Hal Hope, aware.

## 2019-05-10 NOTE — ED Notes (Signed)
ED TO INPATIENT HANDOFF REPORT  ED Nurse Name and Phone #:  Zanovia Rotz RN [phone]   S Name/Age/Gender Louis Bradley 73 y.o. male Room/Bed: 021C/021C  Code Status   Code Status: Not on file  Home/SNF/Other Home Patient oriented to: self, place, time and situation Is this baseline? Yes   Triage Complete: Triage complete  Chief Complaint Fall; Dizziness; Lethargic  Triage Note Pt here from home for evaluation of increased lethargy and dizziness over the last 3-4 days. Today pt got dizzy and fell, hitting knee on the ground. Did not lose consciousness or hit head. L toe amputation of 8/91-QX post-op complications, healing well.    Allergies No Known Allergies  Level of Care/Admitting Diagnosis ED Disposition    ED Disposition Condition Comment   Admit  Hospital Area: [facility] [[id number]]  Level of Care: Progressive [102]  I expect the patient will be discharged within 24 hours: No (not a candidate for 5C-Observation unit)  Covid Evaluation: Screening Protocol (No Symptoms)  Diagnosis: Acute GI bleeding [450388]  Admitting Physician: Rise Patience [phone]  Attending Physician: Rise Patience Lei.Right  PT Class (Do Not Modify): Observation [104]  PT Acc Code (Do Not Modify): Observation [10022]       B Medical/Surgery History Past Medical History:  Diagnosis Date  . Anemia   . Anesthesia complication    General anesthesia makes me feel foggy for long periods of time  . Arthritis   . CKD (chronic kidney disease)    Stage 4-5  . Complication of anesthesia    slow to come out of anesthesia - a lot of confusion   . Diabetes ([place])   . Fournier gangrene   . Heart murmur    Born with, no problems  . History of blood transfusion    x2  . Hyperlipidemia   . Hypertension   . OSA on CPAP   . Osteomyelitis ([place]) 2016   L toe  . Renal atrophy, left   . Vitamin D deficiency    Past Surgical History:  Procedure Laterality Date  .  AMPUTATION TOE Left 04/20/2019   Procedure: AMPUTATION LEFT SECOND AND THIRD TOES;  Surgeon: Newt Minion, MD;  Location: [facility];  Service: Orthopedics;  Laterality: Left;  . COLONOSCOPY    . COLOSTOMY  03/2015  . COLOSTOMY REVERSAL    . TOE AMPUTATION Left 2016   Left big toe amputated due to MRSA infection     A IV Location/Drains/Wounds Patient Lines/Drains/Airways Status   Active Line/Drains/Airways    Name:   Placement date:   Placement time:   Site:   Days:   Peripheral IV 05/10/19 Left Antecubital   05/10/19    1916    Antecubital   less than 1   Incision (Closed) 04/20/19 Foot Left   04/20/19    1402     20          Intake/Output Last 24 hours  Intake/Output Summary (Last 24 hours) at 05/10/2019 2100 Last data filed at 05/10/2019 2001 Gross per 24 hour  Intake 500 ml  Output -  Net 500 ml    Labs/Imaging Results for orders placed or performed during the hospital encounter of 05/10/19 (from the past 48 hour(s))  Basic metabolic panel     Status: Abnormal   Collection Time: 05/10/19  6:27 PM  Result Value Ref Range   Sodium 135 135 - 145 mmol/L   Potassium 5.2 (H) 3.5 - 5.1 mmol/L  Chloride 105 98 - 111 mmol/L   CO2 22 22 - 32 mmol/L   Glucose, Bld 176 (H) 70 - 99 mg/dL   BUN 115 (H) 8 - 23 mg/dL   Creatinine, Ser 5.24 (H) 0.61 - 1.24 mg/dL   Calcium 8.1 (L) 8.9 - 10.3 mg/dL   GFR calc non Af Amer 10 (L) >60 mL/min   GFR calc Af Amer 12 (L) >60 mL/min   Anion gap 8 5 - 15    Comment: Performed at [facility] [address] [place] [address]  CBC     Status: Abnormal   Collection Time: 05/10/19  6:27 PM  Result Value Ref Range   WBC 14.3 (H) 4.0 - 10.5 K/uL   RBC 1.99 (L) 4.22 - 5.81 MIL/uL   Hemoglobin 5.7 (LL) 13.0 - 17.0 g/dL    Comment: REPEATED TO VERIFY DELTA THIS CRITICAL RESULT HAS VERIFIED AND BEEN CALLED TO K KIRK RN BY KIRSTENE FORSYTH ON 06 25 2020 AT 1902, AND HAS BEEN READ BACK.     HCT 18.5 (L) 39.0 - 52.0 %   MCV 93.0  80.0 - 100.0 fL   MCH 28.6 26.0 - 34.0 pg   MCHC 30.8 30.0 - 36.0 g/dL   RDW 14.9 11.5 - 15.5 %   Platelets 215 150 - 400 K/uL   nRBC 0.0 0.0 - 0.2 %    Comment: Performed at [facility] [address] [place], [place] [address]  CBG monitoring, ED     Status: Abnormal   Collection Time: 05/10/19  6:27 PM  Result Value Ref Range   Glucose-Capillary 165 (H) 70 - 99 mg/dL  POC occult blood, ED     Status: Abnormal   Collection Time: 05/10/19  7:13 PM  Result Value Ref Range   Fecal Occult Bld POSITIVE (A) NEGATIVE  Type and screen [place]     Status: None (Preliminary result)   Collection Time: 05/10/19  7:23 PM  Result Value Ref Range   ABO/RH(D) O POS    Antibody Screen NEG    Sample Expiration 05/13/2019,2359    Unit Number [id number]    Blood Component Type RED CELLS,LR    Unit division 00    Status of Unit ISSUED    Transfusion Status OK TO TRANSFUSE    Crossmatch Result      Compatible Performed at [facility], [place] [address], [place], [place] [address]    Unit Number [id number]    Blood Component Type RED CELLS,LR    Unit division 00    Status of Unit ALLOCATED    Transfusion Status OK TO TRANSFUSE    Crossmatch Result Compatible   ABO/Rh     Status: None (Preliminary result)   Collection Time: 05/10/19  7:23 PM  Result Value Ref Range   ABO/RH(D)      O POS Performed at [facility] [address], [place],  [address]   Hepatic function panel     Status: Abnormal   Collection Time: 05/10/19  7:24 PM  Result Value Ref Range   Total Protein 6.1 (L) 6.5 - 8.1 g/dL   Albumin 3.2 (L) 3.5 - 5.0 g/dL   AST 18 15 - 41 U/L   ALT 19 0 - 44 U/L   Alkaline Phosphatase 52 38 - 126 U/L   Total Bilirubin 0.5 0.3 - 1.2 mg/dL   Bilirubin, Direct <0.1 0.0 - 0.2 mg/dL  Indirect Bilirubin NOT CALCULATED 0.3 - 0.9 mg/dL    Comment: Performed at [facility], [place] [address], [place], [place] [address]  Troponin I  (High Sensitivity)     Status: Abnormal   Collection Time: 05/10/19  7:24 PM  Result Value Ref Range   Troponin I (High Sensitivity) 1,542 (HH) <18 ng/L    Comment: CRITICAL RESULT CALLED TO, READ BACK BY AND VERIFIED WITH:  B OSORIO,RN 2046 05/10/2019 WBOND (NOTE) Elevated high sensitivity troponin I (hsTnI) values and significant  changes across serial measurements may suggest ACS but many other  chronic and acute conditions are known to elevate hsTnI results.  Refer to the Links section for chest pain algorithms and additional  guidance. Performed at [facility], [place] [address] [place], [place] [address]   Brain natriuretic peptide     Status: Abnormal   Collection Time: 05/10/19  7:24 PM  Result Value Ref Range   B Natriuretic Peptide 1,144.8 (H) 0.0 - 100.0 pg/mL    Comment: Performed at [facility] [address] [place], [place] [address]  Magnesium     Status: Abnormal   Collection Time: 05/10/19  7:24 PM  Result Value Ref Range   Magnesium 3.0 (H) 1.7 - 2.4 mg/dL    Comment: Performed at [facility] [address], [place], [place] [address]  Protime-INR     Status: None   Collection Time: 05/10/19  7:24 PM  Result Value Ref Range   Prothrombin Time 13.6 11.4 - 15.2 seconds   INR 1.1 0.8 - 1.2    Comment: (NOTE) INR goal varies based on device and disease states. Performed at [facility], [place] [address] [place], [place] [address]   Prepare RBC     Status: None   Collection Time: 05/10/19  7:24 PM  Result Value Ref Range   Order Confirmation      ORDER PROCESSED BY BLOOD BANK Performed at [facility], [place] [address], [place], [place] [address]    Dg Chest Portable 1 View  Result Date: 05/10/2019 CLINICAL DATA:  Dizziness. EXAM: PORTABLE CHEST 1 VIEW COMPARISON:  None. FINDINGS: 1919 hours. Low lung volumes. Interstitial markings are diffusely coarsened with chronic features. Bibasilar patchy airspace disease suggests  atelectasis or infiltrate. No substantial pleural effusion. Cardiopericardial silhouette is at upper limits of normal for size. Bones are diffusely demineralized. Telemetry leads overlie the chest. IMPRESSION: Low lung volumes with bibasilar atelectasis or infiltrate. Electronically Signed   By: Misty Stanley M.D.   On: 05/10/2019 20:09    Pending Labs Unresulted Labs (From admission, onward)    Start     Ordered   05/10/19 1920  Novel Coronavirus,NAA,(SEND-OUT TO REF LAB - TAT 24-48 hrs); Hosp Order  (Asymptomatic Patients Labs)  Once,   STAT    Question:  Rule Out  Answer:  Yes   05/10/19 1919   05/10/19 1902  Troponin I (High Sensitivity)  STAT Now then every 2 hours,   R    Question:  Indication  Answer:  Other   05/10/19 1904   05/10/19 1827  Urinalysis, Routine w reflex microscopic  Once,   STAT     05/10/19 1827   Signed and Held  Basic metabolic panel  Tomorrow morning,   R     Signed and Held   Signed and Held  CBC  Tomorrow morning,   R     Signed and Held  Vitals/Pain Today's Vitals   05/10/19 1930 05/10/19 1947 05/10/19 2015 05/10/19 2028  BP: 124/86  115/67 (!) 112/58  Pulse: 61  61 (!) 58  Resp: (!) 21  (!) 24 17  Temp:    98 F (36.7 C)  TempSrc:    Oral  SpO2: 95%  94% 93%  Weight:      Height:      PainSc:  0-No pain      Isolation Precautions No active isolations  Medications Medications  pantoprazole (PROTONIX) 80 mg in sodium chloride 0.9 % 250 mL (0.32 mg/mL) infusion (has no administration in time range)  pantoprazole (PROTONIX) injection 40 mg (has no administration in time range)  lactated ringers bolus 500 mL (0 mLs Intravenous Stopped 05/10/19 2001)  pantoprazole (PROTONIX) injection 40 mg (40 mg Intravenous Given 05/10/19 1924)    Mobility walks with device   pt states difficulty walking with bad knees. Uses a 4 wheel walker. Difficulty walking long distances  Focused Assessments -   R Recommendations: See Admitting Provider  Note  Report given to:   Additional Notes:

## 2019-05-10 NOTE — ED Triage Notes (Signed)
Pt here from home for evaluation of increased lethargy and dizziness over the last 3-4 days. Today pt got dizzy and fell, hitting knee on the ground. Did not lose consciousness or hit head. L toe amputation of 3/40-GE post-op complications, healing well.

## 2019-05-10 NOTE — Telephone Encounter (Signed)
This encounter was created in error - please disregard.

## 2019-05-10 NOTE — ED Notes (Signed)
Attempted to call report to [facility]. Per floor pt. Not approved. 2C to call back 5-10 minutes

## 2019-05-10 NOTE — Telephone Encounter (Signed)
Summary: FU from earlier NT   Patient was NT today 6/25. Patient was advised to go to ER. Ambulance came and wife states the crew told him there was no reason to take him to the ER. I express to wife that patient was advised to go to ER by PCP. Patient and wife stated they really did not want to go to ER, that patient just needed to get seen by PCP and get blood work done. Patient and wife was under the impression that patient would get another call back from NT. Wife is requesting nurse to call back. Patient call back is [phone]

## 2019-05-10 NOTE — ED Notes (Addendum)
Blood Consent Completed. Pt. Confirms history of blood transfusions. Denies HX of blood transfusion reactions

## 2019-05-10 NOTE — Consult Note (Signed)
Referring Provider:   Dr. Reece Levy Primary Care Physician:  Colon Branch, MD Primary Gastroenterologist: None (unassigned)  Reason for Consultation: Acute on chronic anemia with subacute GI bleeding.  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    He is on a daily 325 mg aspirin for prophylaxis, but denies exposure to other ulcerogenic medications. Last October, while living in [place], he was found to be anemic with a hemoglobin of 6.8, ferritin low at 9, iron saturation 7%, and he was transfused 2 units of packed cells.  He did not have any overt clinical GI bleeding at that time.    He subsequently (by his report) underwent endoscopy and colonoscopy, the results of which are not available on [facility]Everywhere, but according to the patient, the endoscopy showed a "tiny" ulcer and the colonoscopy showed a few small polyps, apparently no definite source of his severe anemia identified at that time.  Note that the patient does have chronic renal insufficiency which may be contributing to his anemia, but he received iron infusions while still living in [place], and on June 5 of this year, his hemoglobin was noted to be 11.1.    In recent days, he has had dark stools and today, became weak and dizzy to the point where he more or less collapsed into a chair.  He was seen in the emergency room, where his BUN was 115 (as compared to 74 when checked 3 weeks earlier) and his hemoglobin had fallen during that same interval from 11.1 to 5.7.  He does not have any prodromal dyspeptic symptoms and is not on any sort of GI medication such as PPIs.   Past Medical History:  Diagnosis Date  . Anemia   . Anesthesia complication    General anesthesia makes me feel foggy for long periods of time  . Arthritis   . CKD (chronic kidney disease)    Stage 4-5  . Complication of anesthesia    slow to come out of anesthesia - a lot of confusion    . Diabetes ([place])   . Fournier gangrene   . Heart murmur    Born with, no problems  . History of blood transfusion    x2  . Hyperlipidemia   . Hypertension   . OSA on CPAP   . Osteomyelitis ([place]) 2016   L toe  . Renal atrophy, left   . Vitamin D deficiency     Past Surgical History:  Procedure Laterality Date  . AMPUTATION TOE Left 04/20/2019   Procedure: AMPUTATION LEFT SECOND AND THIRD TOES;  Surgeon: Newt Minion, MD;  Location: [facility];  Service: Orthopedics;  Laterality: Left;  . COLONOSCOPY    . COLOSTOMY  03/2015  . COLOSTOMY REVERSAL    . TOE AMPUTATION Left 2016   Left big toe amputated due to MRSA infection    Prior to Admission medications   Medication Sig Start Date End Date Taking? Authorizing Provider  amLODipine (NORVASC) 10 MG tablet Take 1 tablet (10 mg total) by mouth daily. 03/13/19  Yes Colon Branch, MD  aspirin 325 MG tablet Take 325 mg by mouth every evening.    Yes [provider]  calcitRIOL (ROCALTROL) 0.25 MCG capsule Take 0.25 mcg by mouth every Monday, Wednesday, and Friday.  03/22/19  Yes [provider]  carvedilol (COREG) 25 MG tablet Take 1 tablet (25 mg total) by mouth  2 (two) times daily with a meal. Patient taking differently: Take 25 mg by mouth 2 (two) times a day.  03/13/19  Yes Paz, Alda Berthold, MD  furosemide (LASIX) 40 MG tablet Take 2 tablets (80 mg total) by mouth daily. 03/13/19  Yes Paz, Alda Berthold, MD  glipiZIDE (GLUCOTROL) 5 MG tablet Take 2 tablets (10 mg total) by mouth 2 (two) times daily. 04/20/19  Yes Paz, Alda Berthold, MD  nicotine polacrilex (COMMIT) 4 MG lozenge Take 4 mg by mouth as needed for smoking cessation.   Yes [provider]  pravastatin (PRAVACHOL) 40 MG tablet Take 1 tablet (40 mg total) by mouth at bedtime. 03/13/19  Yes Colon Branch, MD  Probiotic Product (PROBIOTIC DAILY PO) Take 1 tablet by mouth every Monday, Wednesday, and Friday.    Yes [provider]  sodium bicarbonate 650 MG tablet Take 650  mg by mouth daily. 03/20/19  Yes [provider]  sodium zirconium cyclosilicate (LOKELMA) 10 g PACK packet Take 10 g by mouth daily. Use as directed by nephrology Patient taking differently: Take 10 g by mouth every Monday, Wednesday, and Friday. Use as directed by nephrology 04/16/19  Yes Copland, Gay Filler, MD    Current Facility-Administered Medications  Medication Dose Route Frequency Provider Last Rate Last Dose  . acetaminophen (TYLENOL) tablet 650 mg  650 mg Oral Q6H PRN Rise Patience, MD       Or  . acetaminophen (TYLENOL) suppository 650 mg  650 mg Rectal Q6H PRN Rise Patience, MD      . Derrill Memo ON 05/11/2019] calcitRIOL (ROCALTROL) capsule 0.25 mcg  0.25 mcg Oral Q M,W,F Rise Patience, MD      . insulin aspart (novoLOG) injection 0-9 Units  0-9 Units Subcutaneous Q4H Rise Patience, MD      . pantoprazole (PROTONIX) 80 mg in sodium chloride 0.9 % 250 mL (0.32 mg/mL) infusion  8 mg/hr Intravenous Continuous Rise Patience, MD      . Derrill Memo ON 05/14/2019] pantoprazole (PROTONIX) injection 40 mg  40 mg Intravenous Q12H Rise Patience, MD      . Derrill Memo ON 05/11/2019] sodium bicarbonate tablet 650 mg  650 mg Oral Daily Rise Patience, MD      . Derrill Memo ON 05/11/2019] sodium zirconium cyclosilicate (LOKELMA) packet 10 g  10 g Oral Q M,W,F Rise Patience, MD        Allergies as of 05/10/2019  . (No Known Allergies)    Family History  Problem Relation Age of Onset  . CAD Father 51  . CAD Other        2 uncles had heart attacks  . Colon cancer Neg Hx   . Prostate cancer Neg Hx     Social History   Socioeconomic History  . Marital status: Married    Spouse name: Not on file  . Number of children: 2  . Years of education: Not on file  . Highest education level: Not on file  Occupational History  . Occupation: retired-owned a Environmental manager   . Occupation: retired- Theatre manager  Social Needs  . Financial resource strain: Not  on file  . Food insecurity    Worry: Not on file    Inability: Not on file  . Transportation needs    Medical: Not on file    Non-medical: Not on file  Tobacco Use  . Smoking status: Former Research scientist (life sciences)  . Smokeless tobacco: Never Used  . Tobacco comment:  quit 12/2018 (1 ppd)  Substance and Sexual Activity  . Alcohol use: Yes    Comment: social  . Drug use: Never  . Sexual activity: Not on file  Lifestyle  . Physical activity    Days per week: Not on file    Minutes per session: Not on file  . Stress: Not on file  Relationships  . Social Herbalist on phone: Not on file    Gets together: Not on file    Attends religious service: Not on file    Active member of club or organization: Not on file    Attends meetings of clubs or organizations: Not on file    Relationship status: Not on file  . Intimate partner violence    Fear of current or ex partner: Not on file    Emotionally abused: Not on file    Physically abused: Not on file    Forced sexual activity: Not on file  Other Topics Concern  . Not on file  Social History Narrative   Patient and his wife moved from [place] on 02/14/2021 live with his daughter    Review of Systems: Globally negative.  No chest pain, shortness of breath, chronic dyspeptic symptoms, anorexia, weight loss, skin lesions, adenopathy, lower extremity edema, arthropathy.  Physical Exam: Vital signs in last 24 hours: Temp:  [97.9 F (36.6 C)-98 F (36.7 C)] 98 F (36.7 C) (06/25 2113) Pulse Rate:  [58-61] 59 (06/25 2113) Resp:  [16-24] 22 (06/25 2113) BP: (99-124)/(52-86) 111/61 (06/25 2113) SpO2:  [93 %-95 %] 94 % (06/25 2113) Weight:  [131.5 kg] 131.5 kg (06/25 1823)   General:   Alert,  Well-developed, well-nourished, pleasant and cooperative in NAD Head:  Normocephalic and atraumatic. Eyes:  Sclera clear, no icterus.   Lungs:  Clear anteriorly throughout to auscultation.  No evident respiratory distress. Heart:   Regular rate and  rhythm; no murmurs, clicks, rubs,  or gallops. Abdomen:  Soft, nontender, nontympanitic, and nondistended. No masses, hepatosplenomegaly or ventral hernias noted.  Rectal exam by emergency room provider showed melenic stool without red blood. Msk:   Symmetrical without gross deformities. Extremities:   Without edema. Neurologic:  Alert and coherent;  grossly normal neurologically. Skin:  Intact without significant lesions or rashes. Psych:   Alert and cooperative. Normal mood and affect.  Intake/Output from previous day: No intake/output data recorded. Intake/Output this shift: Total I/O In: 500 [IV Piggyback:500] Out: -   Lab Results: Recent Labs    05/10/19 1827  WBC 14.3*  HGB 5.7*  HCT 18.5*  PLT 215   BMET Recent Labs    05/10/19 1827  NA 135  K 5.2*  CL 105  CO2 22  GLUCOSE 176*  BUN 115*  CREATININE 5.24*  CALCIUM 8.1*   LFT Recent Labs    05/10/19 1924  PROT 6.1*  ALBUMIN 3.2*  AST 18  ALT 19  ALKPHOS 52  BILITOT 0.5  BILIDIR <0.1  IBILI NOT CALCULATED   PT/INR Recent Labs    05/10/19 1924  LABPROT 13.6  INR 1.1    Studies/Results: Dg Chest Portable 1 View  Result Date: 05/10/2019 CLINICAL DATA:  Dizziness. EXAM: PORTABLE CHEST 1 VIEW COMPARISON:  None. FINDINGS: 1919 hours. Low lung volumes. Interstitial markings are diffusely coarsened with chronic features. Bibasilar patchy airspace disease suggests atelectasis or infiltrate. No substantial pleural effusion. Cardiopericardial silhouette is at upper limits of normal for size. Bones are diffusely demineralized. Telemetry leads overlie the chest.  IMPRESSION: Low lung volumes with bibasilar atelectasis or infiltrate. Electronically Signed   By: Misty Stanley M.D.   On: 05/10/2019 20:09    Impression: 1.  Subacute GI bleeding characterized by melenic heme positive stool and 5 g drop in hemoglobin over the past 3 weeks, possibly due to aspirin induced gastropathy 2.  Posthemorrhagic anemia,  superimposed on chronic anemia 3.  Severe chronic renal insufficiency, diabetes 4.  Elevated troponin level, presumably myocardial strain but rule out in process  Plan: 1.  Agree with Protonix infusion  2.  I would favor endoscopic evaluation and I explained to the patient how this can could show findings significantly different from his endoscopy 8 months ago, whereas it is unlikely he has developed a colonic problem in that period of time that would account for his subacute hemorrhage.  Therefore, colonoscopy is not likely to be needed, especially if a source of his recent blood loss was identified on his upper endoscopy.    3. The purpose, potential benefits, and risks of upper endoscopy were reviewed with the patient.  At the moment, he wants to "think about it."  I will leave him n.p.o. so that the option to do endoscopy tomorrow is still open, if he decides to go ahead with it.  There is no need for urgent endoscopy at this time since he does not show signs of active hemorrhage and is hemodynamically stable.  In fact, it would be beneficial for him to finish receiving blood before endoscopy, so as to optimize his medical condition prior to sedation.  4.  Depending on his endoscopic findings, aspirin cessation, or concurrent PPI prophylaxis with ongoing aspirin use, may be needed.  If his EGD is negative, colonoscopy may be needed.   LOS: 0 days   Youlanda Mighty Ediberto Sens  05/10/2019, 10:34 PM   Pager [phone] If no answer or after 5 PM call [phone]

## 2019-05-10 NOTE — H&P (Addendum)
History and Physical    Ammon Muscatello Hofstra ZYS:[id number] DOB: [birth year] DOA: 05/10/2019  PCP: Colon Branch, MD  Patient coming from: Home.  Chief Complaint: Feeling weak and dizzy.  HPI: Louis Bradley is a 73 y.o. male with history of chronic and disease stage IV, diabetes mellitus type 2, hypertension, sleep apnea who had a recent left foot toe amputations has been feeling weak and dizzy over the last 2 days.  Patient states he gets dizzy on ambulation.  Almost had a fall today but did not hit his head or lose consciousness.  Denies any chest pain or shortness of breath.  Has not noticed any obvious blood in the stool or black stools.  Takes aspirin as primary prevention.  ED Course: In the ER patient was mildly hypotensive was given 500 cc normal saline bolus.  EKG shows normal sinus rhythm with T wave inversion in the anterolateral leads.  Patient's blood work showed hemoglobin of 5.7 which is almost 5 g drop from 3 weeks when it was 11.1.  Stool for occult blood was positive and melanotic.  Per patient patient has had a colonoscopy and EGD last year in [place].  Patient is started on Protonix infusion and 2 units of PRBC transfusion has been ordered.  Subsequent which patient's troponin came positive for which I discussed with cardiologist who at this time feel patient's troponin is positive due to demand ischemia from anemia.  Review of Systems: As per HPI, rest all negative.   Past Medical History:  Diagnosis Date   Anemia    Anesthesia complication    General anesthesia makes me feel foggy for long periods of time   Arthritis    CKD (chronic kidney disease)    Stage 4-5   Complication of anesthesia    slow to come out of anesthesia - a lot of confusion    Diabetes ([place])    Fournier gangrene    Heart murmur    Born with, no problems   History of blood transfusion    x2   Hyperlipidemia    Hypertension    OSA on CPAP    Osteomyelitis ([place]) 2016   L toe   Renal  atrophy, left    Vitamin D deficiency     Past Surgical History:  Procedure Laterality Date   AMPUTATION TOE Left 04/20/2019   Procedure: AMPUTATION LEFT SECOND AND THIRD TOES;  Surgeon: Newt Minion, MD;  Location: [facility];  Service: Orthopedics;  Laterality: Left;   COLONOSCOPY     COLOSTOMY  03/2015   COLOSTOMY REVERSAL     TOE AMPUTATION Left 2016   Left big toe amputated due to MRSA infection     reports that he has quit smoking. He has never used smokeless tobacco. He reports current alcohol use. He reports that he does not use drugs.  No Known Allergies  Family History  Problem Relation Age of Onset   CAD Father 60   CAD Other        2 uncles had heart attacks   Colon cancer Neg Hx    Prostate cancer Neg Hx     Prior to Admission medications   Medication Sig Start Date End Date Taking? Authorizing Provider  amLODipine (NORVASC) 10 MG tablet Take 1 tablet (10 mg total) by mouth daily. 03/13/19  Yes Colon Branch, MD  aspirin 325 MG tablet Take 325 mg by mouth every evening.    Yes [provider]  calcitRIOL (  ROCALTROL) 0.25 MCG capsule Take 0.25 mcg by mouth every Monday, Wednesday, and Friday.  03/22/19  Yes [provider]  carvedilol (COREG) 25 MG tablet Take 1 tablet (25 mg total) by mouth 2 (two) times daily with a meal. Patient taking differently: Take 25 mg by mouth 2 (two) times a day.  03/13/19  Yes Paz, Alda Berthold, MD  furosemide (LASIX) 40 MG tablet Take 2 tablets (80 mg total) by mouth daily. 03/13/19  Yes Paz, Alda Berthold, MD  glipiZIDE (GLUCOTROL) 5 MG tablet Take 2 tablets (10 mg total) by mouth 2 (two) times daily. 04/20/19  Yes Paz, Alda Berthold, MD  nicotine polacrilex (COMMIT) 4 MG lozenge Take 4 mg by mouth as needed for smoking cessation.   Yes [provider]  pravastatin (PRAVACHOL) 40 MG tablet Take 1 tablet (40 mg total) by mouth at bedtime. 03/13/19  Yes Colon Branch, MD  Probiotic Product (PROBIOTIC DAILY PO) Take 1 tablet by mouth  every Monday, Wednesday, and Friday.    Yes [provider]  sodium bicarbonate 650 MG tablet Take 650 mg by mouth daily. 03/20/19  Yes [provider]  sodium zirconium cyclosilicate (LOKELMA) 10 g PACK packet Take 10 g by mouth daily. Use as directed by nephrology Patient taking differently: Take 10 g by mouth every Monday, Wednesday, and Friday. Use as directed by nephrology 04/16/19  Yes Copland, Gay Filler, MD    Physical Exam: Vitals:   05/10/19 1900 05/10/19 1930 05/10/19 2015 05/10/19 2028  BP: (!) 107/58 124/86 115/67 (!) 112/58  Pulse:  61 61 (!) 58  Resp: 16 (!) 21 (!) 24 17  Temp:    98 F (36.7 C)  TempSrc:    Oral  SpO2:  95% 94% 93%  Weight:      Height:          Constitutional: Moderately built and nourished. Vitals:   05/10/19 1900 05/10/19 1930 05/10/19 2015 05/10/19 2028  BP: (!) 107/58 124/86 115/67 (!) 112/58  Pulse:  61 61 (!) 58  Resp: 16 (!) 21 (!) 24 17  Temp:    98 F (36.7 C)  TempSrc:    Oral  SpO2:  95% 94% 93%  Weight:      Height:       Eyes: Anicteric no pallor. ENMT: No discharge from the ears eyes nose or mouth. Neck: No mass or.  No neck rigidity. Respiratory: No rhonchi or crepitations. Cardiovascular: S1-S2 heard. Abdomen: Soft nontender bowel sounds present. Musculoskeletal: No edema. Skin: Chronic skin changes. Neurologic: Alert awake oriented to time place and person.  Moves all extremities. Psychiatric: Appears normal per normal affect.   Labs on Admission: I have personally reviewed following labs and imaging studies  CBC: Recent Labs  Lab 05/10/19 1827  WBC 14.3*  HGB 5.7*  HCT 18.5*  MCV 93.0  PLT 798   Basic Metabolic Panel: Recent Labs  Lab 05/10/19 1827 05/10/19 1924  NA 135  --   K 5.2*  --   CL 105  --   CO2 22  --   GLUCOSE 176*  --   BUN 115*  --   CREATININE 5.24*  --   CALCIUM 8.1*  --   MG  --  3.0*   GFR: Estimated Creatinine Clearance: 18.3 mL/min (A) (by C-G formula based  on SCr of 5.24 mg/dL (H)). Liver Function Tests: Recent Labs  Lab 05/10/19 1924  AST 18  ALT 19  ALKPHOS 52  BILITOT  0.5  PROT 6.1*  ALBUMIN 3.2*   No results for input(s): LIPASE, AMYLASE in the last 168 hours. No results for input(s): AMMONIA in the last 168 hours. Coagulation Profile: Recent Labs  Lab 05/10/19 1924  INR 1.1   Cardiac Enzymes: No results for input(s): CKTOTAL, CKMB, CKMBINDEX, TROPONINI in the last 168 hours. BNP (last 3 results) No results for input(s): PROBNP in the last 8760 hours. HbA1C: No results for input(s): HGBA1C in the last 72 hours. CBG: Recent Labs  Lab 05/10/19 1827  GLUCAP 165*   Lipid Profile: No results for input(s): CHOL, HDL, LDLCALC, TRIG, CHOLHDL, LDLDIRECT in the last 72 hours. Thyroid Function Tests: No results for input(s): TSH, T4TOTAL, FREET4, T3FREE, THYROIDAB in the last 72 hours. Anemia Panel: No results for input(s): VITAMINB12, FOLATE, FERRITIN, TIBC, IRON, RETICCTPCT in the last 72 hours. Urine analysis: No results found for: COLORURINE, APPEARANCEUR, LABSPEC, PHURINE, GLUCOSEU, HGBUR, BILIRUBINUR, KETONESUR, PROTEINUR, UROBILINOGEN, NITRITE, LEUKOCYTESUR Sepsis Labs: @LABRCNTIP (procalcitonin:4,lacticidven:4) )No results found for this or any previous visit (from the past 240 hour(s)).   Radiological Exams on Admission: Dg Chest Portable 1 View  Result Date: 05/10/2019 CLINICAL DATA:  Dizziness. EXAM: PORTABLE CHEST 1 VIEW COMPARISON:  None. FINDINGS: 1919 hours. Low lung volumes. Interstitial markings are diffusely coarsened with chronic features. Bibasilar patchy airspace disease suggests atelectasis or infiltrate. No substantial pleural effusion. Cardiopericardial silhouette is at upper limits of normal for size. Bones are diffusely demineralized. Telemetry leads overlie the chest. IMPRESSION: Low lung volumes with bibasilar atelectasis or infiltrate. Electronically Signed   By: Misty Stanley M.D.   On: 05/10/2019  20:09    EKG: Independently reviewed.  Normal sinus rhythm.  Assessment/Plan Principal Problem:   Acute GI bleeding Active Problems:   Chronic renal failure in pediatric patient, stage 4 (severe) ([place])   Aortic stenosis   Diabetes mellitus type 2 with complications ([facility])   Essential hypertension   S/P colostomy ([facility])   Amputated toe, left ([facility])   Acute blood loss anemia    1. Acute GI bleeding -appreciate gastroenterology consult.  Patient is on Protonix.  2 units of PRBC transfusion has been ordered.  Follow CBC after transfusion.  Patient will be kept n.p.o. in anticipation of possible EGD.  Patient has not made a decision yet. 2. Possible non-ST elevation MI -troponin is elevated I discussed with on-call cardiologist Dr. Olena Heckle who at this time feels patient's elevated troponins likely from demand ischemia from symptomatic anemia.  Transfuse and closely watch out for any arrhythmias or worsening shortness of breath.  Will consult cardiology.  No aspirin due to GI bleed. 3. Hypertension we will keep patient on PRN IV hydralazine for now since patient was having low normal blood pressure and also presently n.p.o. 4. Diabetes mellitus type 2 we will keep patient on sliding scale coverage. 5. Recent amputation of left toe. 6. Sleep apnea on CPAP. 7. Acute blood loss anemia follow CBC after transfusion. 8. Chronic and disease stage IV being followed by Dr. Royce Macadamia nephrologist.   DVT prophylaxis: SCDs. Code Status: Full code. Family Communication: Discussed with patient. Disposition Plan: Home. Consults called: Gastroenterology and cardiology. Admission status: Observation.   Rise Patience MD [facility] Pager [phone].  If 7PM-7AM, please contact night-coverage [email or web address] Password [facility]  05/10/2019, 8:48 PM

## 2019-05-10 NOTE — ED Notes (Signed)
ED Provider at bedside. 

## 2019-05-10 NOTE — ED Notes (Signed)
Per [facility] pt. To be admitted to cardiac floor d/t elevated troponin.

## 2019-05-10 NOTE — Telephone Encounter (Signed)
Check on the patient in the morning, he was directed to the ER

## 2019-05-10 NOTE — ED Notes (Signed)
2C10 to call back 5-10 minutes for report.

## 2019-05-10 NOTE — ED Notes (Signed)
[id number] 

## 2019-05-11 ENCOUNTER — Telehealth: Payer: Self-pay

## 2019-05-11 ENCOUNTER — Observation Stay (HOSPITAL_BASED_OUTPATIENT_CLINIC_OR_DEPARTMENT_OTHER): Payer: Medicare Other

## 2019-05-11 DIAGNOSIS — K921 Melena: Secondary | ICD-10-CM | POA: Diagnosis not present

## 2019-05-11 DIAGNOSIS — I10 Essential (primary) hypertension: Secondary | ICD-10-CM

## 2019-05-11 DIAGNOSIS — I214 Non-ST elevation (NSTEMI) myocardial infarction: Secondary | ICD-10-CM

## 2019-05-11 DIAGNOSIS — K922 Gastrointestinal hemorrhage, unspecified: Secondary | ICD-10-CM

## 2019-05-11 DIAGNOSIS — S98132A Complete traumatic amputation of one left lesser toe, initial encounter: Secondary | ICD-10-CM

## 2019-05-11 DIAGNOSIS — I248 Other forms of acute ischemic heart disease: Secondary | ICD-10-CM

## 2019-05-11 DIAGNOSIS — D649 Anemia, unspecified: Secondary | ICD-10-CM | POA: Diagnosis not present

## 2019-05-11 DIAGNOSIS — I340 Nonrheumatic mitral (valve) insufficiency: Secondary | ICD-10-CM

## 2019-05-11 DIAGNOSIS — D62 Acute posthemorrhagic anemia: Secondary | ICD-10-CM | POA: Diagnosis not present

## 2019-05-11 DIAGNOSIS — I350 Nonrheumatic aortic (valve) stenosis: Secondary | ICD-10-CM | POA: Diagnosis not present

## 2019-05-11 LAB — BASIC METABOLIC PANEL
Anion gap: 11 (ref 5–15)
BUN: 119 mg/dL — ABNORMAL HIGH (ref 8–23)
CO2: 20 mmol/L — ABNORMAL LOW (ref 22–32)
Calcium: 8.1 mg/dL — ABNORMAL LOW (ref 8.9–10.3)
Chloride: 106 mmol/L (ref 98–111)
Creatinine, Ser: 5.29 mg/dL — ABNORMAL HIGH (ref 0.61–1.24)
GFR calc Af Amer: 11 mL/min — ABNORMAL LOW (ref >60)
GFR calc non Af Amer: 10 mL/min — ABNORMAL LOW (ref >60)
Glucose, Bld: 139 mg/dL — ABNORMAL HIGH (ref 70–99)
Potassium: 4.6 mmol/L (ref 3.5–5.1)
Sodium: 137 mmol/L (ref 135–145)

## 2019-05-11 LAB — GLUCOSE, CAPILLARY
Glucose-Capillary: 119 mg/dL — ABNORMAL HIGH (ref 70–99)
Glucose-Capillary: 121 mg/dL — ABNORMAL HIGH (ref 70–99)
Glucose-Capillary: 139 mg/dL — ABNORMAL HIGH (ref 70–99)

## 2019-05-11 LAB — LIPID PANEL
Cholesterol: 83 mg/dL (ref 0–200)
HDL: 24 mg/dL — ABNORMAL LOW (ref >40)
LDL Cholesterol: 32 mg/dL (ref 0–99)
Total CHOL/HDL Ratio: 3.5 RATIO
Triglycerides: 136 mg/dL (ref <150)
VLDL: 27 mg/dL (ref 0–40)

## 2019-05-11 LAB — CBC
HCT: 20.9 % — ABNORMAL LOW (ref 39.0–52.0)
Hemoglobin: 6.8 g/dL — CL (ref 13.0–17.0)
MCH: 29.3 pg (ref 26.0–34.0)
MCHC: 32.5 g/dL (ref 30.0–36.0)
MCV: 90.1 fL (ref 80.0–100.0)
Platelets: 202 10*3/uL (ref 150–400)
RBC: 2.32 MIL/uL — ABNORMAL LOW (ref 4.22–5.81)
RDW: 14.6 % (ref 11.5–15.5)
WBC: 13.7 10*3/uL — ABNORMAL HIGH (ref 4.0–10.5)
nRBC: 0.2 % (ref 0.0–0.2)

## 2019-05-11 LAB — MAGNESIUM: Magnesium: 2.9 mg/dL — ABNORMAL HIGH (ref 1.7–2.4)

## 2019-05-11 LAB — TROPONIN I (HIGH SENSITIVITY): Troponin I (High Sensitivity): 2052 ng/L (ref <18)

## 2019-05-11 LAB — ECHOCARDIOGRAM COMPLETE
Height: 75 in
Weight: 4705.5 oz

## 2019-05-11 LAB — PREPARE RBC (CROSSMATCH)

## 2019-05-11 MED ORDER — PANTOPRAZOLE SODIUM 40 MG PO TBEC: 40.0000 mg | DELAYED_RELEASE_TABLET | Freq: Two times a day (BID) | ORAL | 0 refills | Status: DC

## 2019-05-11 MED ORDER — PANTOPRAZOLE SODIUM 40 MG PO TBEC: 40.0000 mg | DELAYED_RELEASE_TABLET | Freq: Two times a day (BID) | ORAL | Status: DC

## 2019-05-11 MED ORDER — ORAL CARE MOUTH RINSE: 15.0000 mL | Freq: Two times a day (BID) | OROMUCOSAL | Status: DC

## 2019-05-11 MED ORDER — SODIUM CHLORIDE 0.9% IV SOLUTION: Freq: Once | INTRAVENOUS | Status: DC

## 2019-05-11 MED ORDER — HYDRALAZINE HCL 20 MG/ML IJ SOLN: 10.0000 mg | INTRAMUSCULAR | Status: DC | PRN

## 2019-05-11 MED FILL — PANTOPRAZOLE SOD DR 40 MG T: 40 | 30 days supply | Qty: 60 | Fill #0

## 2019-05-11 NOTE — Consult Note (Addendum)
Cardiology Consultation:   Patient ID: Louis Bradley; [id number]; [birth year]   Admit date: 05/10/2019 Date of Consult: 05/11/2019  Primary Care Provider: Colon Branch, MD Primary Cardiologist: New to [facility]  Patient Profile:   Louis Bradley is a 73 y.o. male with a hx of chronic kidney disease stage IV, diabetes mellitus type 2, PVD, hypertension and sleep apnea who is being seen today for the evaluation of elevated troponin at the request of Dr. Hal Hope.  History of Present Illness:   Louis Bradley is a 37yoM with a hx as stated above who presented to [facility] on 05/10/2019 with complaints of feeling weak over the last 7-10 days. He recently underwent a left foot toe amputation and was feeling more weak however was able to continue his normal activities. Over the last 3-4 days he reports increasing dizziness with ambulation and change of position in which he almost sustained a fall prior to admission. He lives at home with his wife. He recently moved to [facility] from [facility] to be closer to their daughter and grandchildren. He has no prior hx of CAD. Has a known murmur for which he underwent an echocardiogram several years ago while in [place]. He denies tobacco, alcohol or illicit drug use. He reports medication compliance.   On my interview, he episodes of chest pain, palpitations, SOB, PND, orthopnea, or LE swelling. Prior to his toe amputation, he was in his usual state of health, able to perform activities without anginal symptoms or SOB.  In the ED, he was found to be mildly hypotensive and was given a 536ml IVF bolus. EKG with NSR with diffuse TWI in leads I, II, II, aVF and V3-V6 with no comparable tracing on file. CBC revealed a HB of 5.7 which is apparently a 5gm drop from 3 weeks prior when he was at 11.1. Stool was occult positive. Pt was started on Protonix infusion and was given 2 PRBCs. HsT was found to be elevated at 2052. Cardiology initially contacted with thoughts that elevation was related to  demand ischemia from marked anemia. BNP was elevated as well at 1144. CXR with bibasilar atelectasis or infiltrate.   Cardiology has been asked to formally consult given EKG changes and elevated troponin.   Past Medical History:  Diagnosis Date  . Anemia   . Anesthesia complication    General anesthesia makes me feel foggy for long periods of time  . Arthritis   . CKD (chronic kidney disease)    Stage 4-5  . Complication of anesthesia    slow to come out of anesthesia - a lot of confusion   . Diabetes ([place])   . Fournier gangrene   . Heart murmur    Born with, no problems  . History of blood transfusion    x2  . Hyperlipidemia   . Hypertension   . OSA on CPAP   . Osteomyelitis ([place]) 2016   L toe  . Renal atrophy, left   . Vitamin D deficiency     Past Surgical History:  Procedure Laterality Date  . AMPUTATION TOE Left 04/20/2019   Procedure: AMPUTATION LEFT SECOND AND THIRD TOES;  Surgeon: Newt Minion, MD;  Location: [place];  Service: Orthopedics;  Laterality: Left;  . COLONOSCOPY    . COLOSTOMY  03/2015  . COLOSTOMY REVERSAL    . TOE AMPUTATION Left 2016   Left big toe amputated due to MRSA infection     Prior to Admission medications   Medication  Sig Start Date End Date Taking? Authorizing Provider  amLODipine (NORVASC) 10 MG tablet Take 1 tablet (10 mg total) by mouth daily. 03/13/19  Yes Colon Branch, MD  aspirin 325 MG tablet Take 325 mg by mouth every evening.    Yes [provider]  calcitRIOL (ROCALTROL) 0.25 MCG capsule Take 0.25 mcg by mouth every Monday, Wednesday, and Friday.  03/22/19  Yes [provider]  carvedilol (COREG) 25 MG tablet Take 1 tablet (25 mg total) by mouth 2 (two) times daily with a meal. Patient taking differently: Take 25 mg by mouth 2 (two) times a day.  03/13/19  Yes Paz, Alda Berthold, MD  furosemide (LASIX) 40 MG tablet Take 2 tablets (80 mg total) by mouth daily. 03/13/19  Yes Paz, Alda Berthold, MD  glipiZIDE (GLUCOTROL) 5 MG tablet  Take 2 tablets (10 mg total) by mouth 2 (two) times daily. 04/20/19  Yes Paz, Alda Berthold, MD  nicotine polacrilex (COMMIT) 4 MG lozenge Take 4 mg by mouth as needed for smoking cessation.   Yes [provider]  pravastatin (PRAVACHOL) 40 MG tablet Take 1 tablet (40 mg total) by mouth at bedtime. 03/13/19  Yes Colon Branch, MD  Probiotic Product (PROBIOTIC DAILY PO) Take 1 tablet by mouth every Monday, Wednesday, and Friday.    Yes [provider]  sodium bicarbonate 650 MG tablet Take 650 mg by mouth daily. 03/20/19  Yes [provider]  sodium zirconium cyclosilicate (LOKELMA) 10 g PACK packet Take 10 g by mouth daily. Use as directed by nephrology Patient taking differently: Take 10 g by mouth every Monday, Wednesday, and Friday. Use as directed by nephrology 04/16/19  Yes Copland, Gay Filler, MD    Inpatient Medications: Scheduled Meds: . calcitRIOL  0.25 mcg Oral Q M,W,F  . insulin aspart  0-9 Units Subcutaneous Q4H  . mouth rinse  15 mL Mouth Rinse BID  . [START ON 05/14/2019] pantoprazole  40 mg Intravenous Q12H  . sodium bicarbonate  650 mg Oral Daily  . sodium zirconium cyclosilicate  10 g Oral Q M,W,F   Continuous Infusions: . pantoprozole (PROTONIX) infusion     PRN Meds: acetaminophen **OR** acetaminophen, hydrALAZINE  Allergies:   No Known Allergies  Social History:   Social History   Socioeconomic History  . Marital status: Married    Spouse name: Not on file  . Number of children: 2  . Years of education: Not on file  . Highest education level: Not on file  Occupational History  . Occupation: retired-owned a Environmental manager   . Occupation: retired- Theatre manager  Social Needs  . Financial resource strain: Not on file  . Food insecurity    Worry: Not on file    Inability: Not on file  . Transportation needs    Medical: Not on file    Non-medical: Not on file  Tobacco Use  . Smoking status: Former Research scientist (life sciences)  . Smokeless tobacco: Never Used  .  Tobacco comment: quit 12/2018 (1 ppd)  Substance and Sexual Activity  . Alcohol use: Yes    Comment: social  . Drug use: Never  . Sexual activity: Not on file  Lifestyle  . Physical activity    Days per week: Not on file    Minutes per session: Not on file  . Stress: Not on file  Relationships  . Social Herbalist on phone: Not on file    Gets together: Not on file  Attends religious service: Not on file    Active member of club or organization: Not on file    Attends meetings of clubs or organizations: Not on file    Relationship status: Not on file  . Intimate partner violence    Fear of current or ex partner: Not on file    Emotionally abused: Not on file    Physically abused: Not on file    Forced sexual activity: Not on file  Other Topics Concern  . Not on file  Social History Narrative   Patient and his wife moved from [place] on 02/14/2021 live with his daughter    Family History:   Family History  Problem Relation Age of Onset  . CAD Father 20  . CAD Other        2 uncles had heart attacks  . Colon cancer Neg Hx   . Prostate cancer Neg Hx    Family Status:  Family Status  Relation Name Status  . Father  (Not Specified)  . Other  (Not Specified)  . Neg Hx  (Not Specified)    ROS:  Please see the history of present illness.  All other ROS reviewed and negative.     Physical Exam/Data:   Vitals:   05/11/19 0248 05/11/19 0254 05/11/19 0553 05/11/19 0721  BP: 113/69   (!) 114/58  Pulse: (!) 59   (!) 57  Resp: (!) 22   20  Temp:  97.9 F (36.6 C)  97.9 F (36.6 C)  TempSrc:  Oral  Oral  SpO2: 97%   98%  Weight:   133.4 kg   Height:        Intake/Output Summary (Last 24 hours) at 05/11/2019 0808 Last data filed at 05/11/2019 0230 Gross per 24 hour  Intake 1130 ml  Output -  Net 1130 ml   Filed Weights   05/10/19 1823 05/10/19 2230 05/11/19 0553  Weight: 131.5 kg 133.1 kg 133.4 kg   Body mass index is 36.76 kg/m.   General:  Overweight, NAD  Skin: Warm, dry, intact  Neck: Negative for carotid bruits. No JVD Lungs:Clear to ausculation bilaterally. No wheezes, rales, or rhonchi. Breathing is unlabored. Cardiovascular: RRR with S1 S2. + murmur Abdomen: Soft, non-tender, distended. No obvious abdominal masses. Extremities: No edema. No clubbing or cyanosis. DP/PT pulses 2+ bilaterally Neuro: Alert and oriented. No focal deficits. No facial asymmetry. MAE spontaneously. Psych: Responds to questions appropriately with normal affect.     EKG:  The EKG was personally reviewed and demonstrates: EKG with NSR with diffuse TWI in leads I, II, II, aVF and V3-V6 with no comparable tracing on file.  Telemetry:  Telemetry was personally reviewed and demonstrates: 05/10/2019 NSR with HR in the 70-90  Relevant CV Studies:  ECHO: None   CATH: None   Laboratory Data:  Chemistry Recent Labs  Lab 05/10/19 1827  NA 135  K 5.2*  CL 105  CO2 22  GLUCOSE 176*  BUN 115*  CREATININE 5.24*  CALCIUM 8.1*  GFRNONAA 10*  GFRAA 12*  ANIONGAP 8    Total Protein  Date Value Ref Range Status  05/10/2019 6.1 (L) 6.5 - 8.1 g/dL Final   Albumin  Date Value Ref Range Status  05/10/2019 3.2 (L) 3.5 - 5.0 g/dL Final   AST  Date Value Ref Range Status  05/10/2019 18 15 - 41 U/L Final   ALT  Date Value Ref Range Status  05/10/2019 19 0 - 44 U/L Final  Alkaline Phosphatase  Date Value Ref Range Status  05/10/2019 52 38 - 126 U/L Final   Total Bilirubin  Date Value Ref Range Status  05/10/2019 0.5 0.3 - 1.2 mg/dL Final   Hematology Recent Labs  Lab 05/10/19 1827  WBC 14.3*  RBC 1.99*  HGB 5.7*  HCT 18.5*  MCV 93.0  MCH 28.6  MCHC 30.8  RDW 14.9  PLT 215   Cardiac EnzymesNo results for input(s): TROPONINI in the last 168 hours. No results for input(s): TROPIPOC in the last 168 hours.  BNP Recent Labs  Lab 05/10/19 1924  BNP 1,144.8*    DDimer No results for input(s): DDIMER in the last 168 hours.  TSH: No results found for: TSH Lipids:No results found for: CHOL, HDL, LDLCALC, LDLDIRECT, TRIG, CHOLHDL HgbA1c:No results found for: HGBA1C  Radiology/Studies:  Dg Chest Portable 1 View  Result Date: 05/10/2019 CLINICAL DATA:  Dizziness. EXAM: PORTABLE CHEST 1 VIEW COMPARISON:  None. FINDINGS: 1919 hours. Low lung volumes. Interstitial markings are diffusely coarsened with chronic features. Bibasilar patchy airspace disease suggests atelectasis or infiltrate. No substantial pleural effusion. Cardiopericardial silhouette is at upper limits of normal for size. Bones are diffusely demineralized. Telemetry leads overlie the chest. IMPRESSION: Low lung volumes with bibasilar atelectasis or infiltrate. Electronically Signed   By: Misty Stanley M.D.   On: 05/10/2019 20:09   Assessment and Plan:   1.Elevated troponin with EKG changes: -Pt presented with recent onset of dizziness and fatigued found to have a Hb of 5.7 on presenation with + fecal occult stool. GI consulted and patient was started on Protonix gtt and given 2xPRBCs.  -EKG performed on admission with diffuse TWI in leads I, II, III, aVF and V3-V6 with no comparable tracing on file -HsT markedly elevated at 1542>>2052 -Pt resting comfortable at this time. Denies any episodes of chest pain or other anginal equivalent  -No prior hx of CAD however risk factors that include DM2, PVD, obestiy and HTN -ASA currently on hold secondary to acute GI bleed (was on ASA 325) -Given significant EKG changes, elevated HsT and cardiac risk factors would recommend LHC for further evaluation. Base on EKG findings, severe 3-vessel CAD is possible. He has known CKD stage IV and therefore this would risk him needing HD  -Will need renal consult prior if agreeable to cath  -Will obtain echocardiogram given presence of murmur on PE (known to patient) -On home carvedilol 25, pravastatin 40, amlodipine 10, Lasix 80 QD  -BP cannot tolerate restart of BB at this time  -No ASA given GI bleed   2. GI bleed with acute blood loss anemia: -Pt with recent toe amputation>>felling more fatigued and dizzy over the last 2-3 days found to have a Hb of 5 on presentaiton  -GI consulted>>protonix gtt initiated and pt given 2 PRBCs -Follow with serial CBC  -Pt refusing endoscopy today   3. CKD stage IV: -Creatinine, 4.76>>5.24 -Needs IVF hydration  -Baseline creatinine in the 4.0 range  -Follows with neohrology in the OP setting -Consider renal consult while IP  4. DM2: -Will obtain HbA1c -On home glipizide  -SSI for glucose control while inpatient status  -Per IM  5. HTN: -Stable, 114/58>113/69>116/58>119/61 -Not currently on antihypertensive given hypotension on hospital presentation -Follow   6. Hyperkalemia: -K+ today, 5.2 -Monitor with BMET in AM  -No arrhthymias on telemetry    For questions or updates, please contact [place] Please consult [email or web address] for contact info under Cardiology/STEMI.   Signed, Kathyrn Drown NP-C  [facility] Pager: [phone] 05/11/2019 8:08 AM   Personally seen and examined. Agree with above.   73 year old with acute GI bleed hemoglobin of 5 with no chest discomfort however troponin was elevated at 2000, EKG concerning for deep T wave ST segment depression diffusely for global ischemia.  He reports that he does not have any chest discomfort leading up to his GI bleed.  No prior heart issues he states.  He does have diabetes chronic kidney disease stage IV/V seen by Dr. Royce Macadamia of [facility], and he is followed by Dr. Larose Kells as his primary care physician.  GEN: Well nourished, well developed, in no acute distress  HEENT: normal  Neck: no JVD, carotid bruits, or masses Cardiac: RRR; 3/6 SM, no rubs, or gallops,no edema  Respiratory:  clear to auscultation bilaterally, normal work of breathing GI: soft, nontender, nondistended, + BS MS: no deformity or atrophy  Skin: warm and dry, no rash Neuro:  Alert  and Oriented x 3, Strength and sensation are intact Psych: euthymic mood, full affect  EKG with deep T wave inversion in multiple leads.  This was in the setting of hemoglobin of 5.7.  High sensitive troponin 2052.  Assessment and plan:  Elevated high-sensitivity troponin consistent with demand ischemia in the setting of GI bleed, anemia - He does however have an ischemic appearing EKG and high sensitive troponin was 2000.  This would have translated to a troponin of 2 in our old assay.  I am concerned based upon his EKG and troponin that he may have underlying triple-vessel coronary artery disease.  He has had diabetes as well as history of toe amputations in the past.  He denied any symptoms of chest pain or angina prior to this episode.  Currently denies any episodes as well. - My plan for him was to proceed with coronary angiography, right radial, with dye sparing.  We had a long talk about given his chronic kidney disease that even with low amounts of IV dye, this may lead to the need for hemodialysis.  He understands this.  I also explained to him that with his EKG changes, he may actually have disease that were would require bypass surgery.  He understands as well. -After lengthy discussion, he has decided to go home and forego testing at this time.  I explained to him that the elevated troponin does portend a worsened prognosis and can increase his morbidity mortality over the next 30 days.  He understands.  He states that he wants to talk to Dr. Royce Macadamia his nephrologist as well as Dr. Larose Kells his primary care physician.  I will have him set up with me as well to further discuss possible catheterization in the future if he is willing to proceed. -Continue with carvedilol pravastatin amlodipine when able to tolerate from a blood pressure perspective.  Heart murmur - Sounds like aortic stenosis.  He states that he has had a heart murmur since birth.  I wonder if he has a bicuspid aortic valve that  is now significant.  We will check an echocardiogram.  He states that he will stay here for that.  Acute GI bleed - He has refused endoscopy, Dr. Cristina Gong.  He has received 2 units of packed red blood cells.  PPI. -Because of this, unable to utilize aspirin or Plavix.   Chronic kidney disease stage IV/V -Dr. Royce Macadamia with [facility] has been following since he has moved from [place].  He states that he understands that he will eventually  need hemodialysis. -He would like to talk with her before proceeding with cardiac catheterization.  I offered him inpatient consultation   Candee Furbish, MD

## 2019-05-11 NOTE — Telephone Encounter (Signed)
Pt admitted

## 2019-05-11 NOTE — Telephone Encounter (Signed)
The patient was admitted yesterday due to weakness, dizziness, dark stools. He was found to have profound anemia. Troponins were elevated, EKG was abnormal. GI and cardiology were consulted. He was recommended upper endoscopy but the patient declined. Cardiology recommended a cardiac catheterization due to the elevated troponin, they suspected and  liked to rule out underlying triple-vessel coronary disease. They explained the patient the risk of making his kidney failure worse needing dialysis. Eventually, the patient decided against the procedure. The wife requested to talk to me, I reviewed the discharge summary, GI and cardiology notes and we had a long conversation over the phone with both the patient and his wife.  I explained her the benefits (and need) of a cardiac catheterization, the fact that indeed there is a risk of kidney damage due to dye accelerating the need to proceed with hemodialysis . She is concerned about that but I explained that the procedure is done in the hospital in a very controlled environment, most likely in consultation with his kidney doctors. At this point the patient is at home and I recommend the following: -Call cardiology on Monday (today is Friday), get an appointment and revisit the issue. -Call GI on Monday and get a follow-up -ER during the weekend if severe dizziness, weakness, change in the color of the stools, abdominal pain, chest pain, shortness of breath. --We will call her and make a follow-up with me next week. 25 minutes

## 2019-05-11 NOTE — Telephone Encounter (Signed)
Copied from [place] [id number]. Topic: General - Other >> May 11, 2019  3:54 PM Mcneil, Ja-Kwan wrote: Reason for CRM: Pt wife Lorin stated pt was admitted in to the hospital and they would like Dr. Larose Kells to review the records and give them a call back. Pt wife stated they were referred to Dr. Marlou Porch ph# [phone] and they would like to know if Dr.Paz would prefer if they saw another doctor.

## 2019-05-11 NOTE — Progress Notes (Signed)
Subjective: No abdominal pain. No blood in stool.  Objective: Vital signs in last 24 hours: Temp:  [97.8 F (36.6 C)-98 F (36.7 C)] 97.9 F (36.6 C) (06/26 0721) Pulse Rate:  [57-61] 57 (06/26 0721) Resp:  [16-24] 20 (06/26 0721) BP: (99-124)/(52-86) 114/58 (06/26 0721) SpO2:  [90 %-98 %] 98 % (06/26 0721) Weight:  [131.5 kg-133.4 kg] 133.4 kg (06/26 0553) Weight change:  Last BM Date: 05/09/19  PE: GEN:  NAD, pale ABD:  Protuberant, soft, non-tender  Lab Results: CBC    Component Value Date/Time   WBC 13.7 (H) 05/11/2019 0935   RBC 2.32 (L) 05/11/2019 0935   HGB 6.8 (LL) 05/11/2019 0935   HCT 20.9 (L) 05/11/2019 0935   PLT 202 05/11/2019 0935   MCV 90.1 05/11/2019 0935   MCH 29.3 05/11/2019 0935   MCHC 32.5 05/11/2019 0935   RDW 14.6 05/11/2019 0935    Assessment:  1.  Dark hemoccult-positive stools. 2.  Acute on chronic anemia.  Prior evaluation in [place] few months ago. 3.  Elevated troponin. 4.  Chronic comorbidities.  Plan:  1.  PPI. 2.  Antiplatelets on hold. 3.  Serial CBCs, transfuse as needed. 4.  Cardiac evaluation in progress. 5.  Patient does not want endoscopy. 6.  [facility] will sign-off; please call with any further questions or if patient changes his mind about endoscopy.   Landry Dyke 05/11/2019, 11:03 AM   Cell [phone] If no answer or after 5 PM call [phone]

## 2019-05-11 NOTE — Plan of Care (Signed)

## 2019-05-11 NOTE — Discharge Summary (Signed)
Physician Discharge Summary  Louis Bradley Mayfield YTK:[id number] DOB: [birth year] DOA: 05/10/2019  PCP: Colon Branch, MD  Admit date: 05/10/2019 Discharge date: 05/11/2019  Time spent: 60 minutes  Recommendations for Outpatient Follow-up:   Acute GI bleed - Patient was evaluated in the ED by Dr. Ronald Lobo GI who recommended that patient obtain EGD.  Patient refused EGD. \ - Protonix drip started - Transfused 2 units PRBC  - Volume resuscitated lactated Ringer's - Patient has requested to be discharged. - Protonix 40 mg BID - Schedule appointment establish care with Dr. Herbie Baltimore Buccini GI in 1 to 2-week GI bleed  NSTEMI - Although patient never complained of chest pain abnormal EKG obtained.  No EKG for comparison. - 6/26 EKG T wave inversion in all leads, with elevated sensitive troponin.  1542, 2052 - Discussed case with cardiology Dr. Candee Furbish and agrees that patient should have further evaluation preferably cardiac catheterization, given his additional risk factors of diabetes, PVD, HTN.  Unfortunately patient refuses cardiac catheterization. - Have gotten patient to agree to remain for echocardiogram. - Patient to follow-up with Dr. Candee Furbish in approximately 1 to 2 weeks.  Patient was explained at length by cardiology is significant increased risk factor for MI. - Since patient will not remain for cardiac catheterization, and unsure whether he is actually had MI will transfuse 1 more unit PRBC  Essential HTN -Currently controlled on his home regimen  Diabetes type 2 uncontrolled with complication - Hemoglobin A1c pending -Lipid panel pending - Continue his home regimen, labs have not returned at time of discharge PCP to make changes as required.  OSA on CPAP - Patient to continue using CPAP at home  CKD stage IV -Patient states will follow-up with Dr. Royce Macadamia his nephrologist ASAP upon discharge, to discuss need for cardiac catheterization.     Discharge Diagnoses:   Principal Problem:   Acute GI bleeding Active Problems:   Chronic renal failure in 73 pediatric patient, stage 4 (severe) ([place])   Aortic stenosis   Diabetes mellitus type 2 with complications (HCC)   Essential hypertension   S/P colostomy (HCC)   Amputated toe, left (HCC)   Acute blood loss anemia   Discharge Condition: Stable  Diet recommendation: Heart healthy/carb modified  Filed Weights   05/10/19 1823 05/10/19 2230 05/11/19 0553  Weight: 131.5 kg 133.1 kg 133.4 kg    History of present illness:  73 y.o.WM PMHx CKD stage IV , diabetes mellitus type 2 uncontrolled with complication,  HTN, OSA,, s/p recent LEFT  foot toe amputations  Has been feeling weak and dizzy over the last 2 days. Patient states he gets dizzy on ambulation. Almost had a fall today but did not hit his head or lose consciousness. Denies any chest pain or shortness of breath. Has not noticed any obvious blood in the stool or black stools. Takes aspirin as primary prevention.  ED Course:In the ER patient was mildly hypotensive was given 500 cc normal saline bolus. EKG shows normal sinus rhythm with T wave inversion in the anterolateral leads. Patient's blood work showed hemoglobin of 5.7 which is almost 5 g drop from 3 weeks when it was 11.1. Stool for occult blood was positive and melanotic. Per patient patient has had a colonoscopy and EGD last year in [place]. Patient is started on Protonix infusion and 2 units of PRBC transfusion has been ordered. Subsequent which patient's troponin came positive for which I discussed with cardiologist who at this time feel patient's troponin is  positive due to demand ischemia from anemia.  Hospital Course:  Patient refused all recommendations from all physicians except for transfusions of blood products and echocardiogram.  Was advised by all physicians that he had severely increased risk of MI and DEATH, insisted on being discharged.  Follow-up appointments were  arranged with cardiology, GI, PCP   Procedures: Transfuse 3 units PRBC Echocardiogram pending   Consultations: Cardiology    Discharge Exam: Vitals:   05/11/19 0553 05/11/19 0721 05/11/19 1208 05/11/19 1227  BP:  (!) 114/58 (!) 107/59 113/66  Pulse:  (!) 57 67   Resp:  20 18   Temp:  97.9 F (36.6 C) 97.8 F (36.6 C) 98.5 F (36.9 C)  TempSrc:  Oral Oral Oral  SpO2:  98% 94%   Weight: 133.4 kg     Height:        Examination:  General:  A/O x4, no acute respiratory distress Eyes: negative scleral hemorrhage, negative anisocoria, negative icterus ENT: Negative Runny nose, negative gingival bleeding, Neck:  Negative scars, masses, torticollis, lymphadenopathy, JVD Lungs: Clear to auscultation bilaterally without wheezes or crackles Cardiovascular: Regular rate and rhythm positive grade 3/6 holosystolic murmur gallop or rub normal S1 and S2 Abdomen: Obese, negative abdominal pain, nondistended, positive soft, bowel sounds, no rebound, no ascites, no appreciable mass Extremities: No significant cyanosis, clubbing, or edema bilateral lower extremities Skin: Negative rashes, lesions, ulcers Psychiatric:  Negative depression, negative anxiety, negative fatigue, negative mania  Central nervous system:  Cranial nerves II through XII intact, tongue/uvula midline, all extremities muscle strength 5/5, sensation intact throughout, negative dysarthria, negative expressive aphasia, negative receptive aphasia.    Discharge Instructions   Allergies as of 05/11/2019   No Known Allergies     Medication List    STOP taking these medications   aspirin 325 MG tablet     TAKE these medications   amLODipine 10 MG tablet Commonly known as: NORVASC Take 1 tablet (10 mg total) by mouth daily.   calcitRIOL 0.25 MCG capsule Commonly known as: ROCALTROL Take 0.25 mcg by mouth every Monday, Wednesday, and Friday.   carvedilol 25 MG tablet Commonly known as: COREG Take 1 tablet  (25 mg total) by mouth 2 (two) times daily with a meal. What changed: when to take this   furosemide 40 MG tablet Commonly known as: LASIX Take 2 tablets (80 mg total) by mouth daily.   glipiZIDE 5 MG tablet Commonly known as: GLUCOTROL Take 2 tablets (10 mg total) by mouth 2 (two) times daily.   nicotine polacrilex 4 MG lozenge Commonly known as: COMMIT Take 4 mg by mouth as needed for smoking cessation.   pantoprazole 40 MG tablet Commonly known as: PROTONIX Take 1 tablet (40 mg total) by mouth 2 (two) times daily.   pravastatin 40 MG tablet Commonly known as: PRAVACHOL Take 1 tablet (40 mg total) by mouth at bedtime.   PROBIOTIC DAILY PO Take 1 tablet by mouth every Monday, Wednesday, and Friday.   sodium bicarbonate 650 MG tablet Take 650 mg by mouth daily.   sodium zirconium cyclosilicate 10 g Pack packet Commonly known as: Lokelma Take 10 g by mouth daily. Use as directed by nephrology What changed: when to take this      No Known Allergies Follow-up Information    Buccini, Robert, MD. Schedule an appointment as soon as possible for a visit in 2 days.   Specialty: Gastroenterology Why: Schedule appointment establish care with Dr. Herbie Baltimore Buccini GI in 1 to  2-week GI bleed Contact information: [address] [place] [address] [phone]            The results of significant diagnostics from this hospitalization (including imaging, microbiology, ancillary and laboratory) are listed below for reference.    Significant Diagnostic Studies: Dg Chest Portable 1 View  Result Date: 05/10/2019 CLINICAL DATA:  Dizziness. EXAM: PORTABLE CHEST 1 VIEW COMPARISON:  None. FINDINGS: 1919 hours. Low lung volumes. Interstitial markings are diffusely coarsened with chronic features. Bibasilar patchy airspace disease suggests atelectasis or infiltrate. No substantial pleural effusion. Cardiopericardial silhouette is at upper limits of normal for size.  Bones are diffusely demineralized. Telemetry leads overlie the chest. IMPRESSION: Low lung volumes with bibasilar atelectasis or infiltrate. Electronically Signed   By: Misty Stanley M.D.   On: 05/10/2019 20:09   Dg Foot Complete Left  Result Date: 04/12/2019 CLINICAL DATA:  Toe infection. EXAM: LEFT FOOT - COMPLETE 3+ VIEW COMPARISON:  None. FINDINGS: Status post surgical amputation of distal portion of first metatarsal and phalanges. Lytic destruction is seen involving the distal portions of the second and third distal phalanges suggesting osteomyelitis. Moderate posterior calcaneal spurring is noted. IMPRESSION: Lytic destruction is seen involving the distal tufts of the second and third distal phalanges suggesting osteomyelitis. Status post surgical amputation of first toe. Electronically Signed   By: Marijo Conception M.D.   On: 04/12/2019 13:49    Microbiology: No results found for this or any previous visit (from the past 240 hour(s)).   Labs: Basic Metabolic Panel: Recent Labs  Lab 05/10/19 1827 05/10/19 1924 05/11/19 0935  NA 135  --  137  K 5.2*  --  4.6  CL 105  --  106  CO2 22  --  20*  GLUCOSE 176*  --  139*  BUN 115*  --  119*  CREATININE 5.24*  --  5.29*  CALCIUM 8.1*  --  8.1*  MG  --  3.0* 2.9*   Liver Function Tests: Recent Labs  Lab 05/10/19 1924  AST 18  ALT 19  ALKPHOS 52  BILITOT 0.5  PROT 6.1*  ALBUMIN 3.2*   No results for input(s): LIPASE, AMYLASE in the last 168 hours. No results for input(s): AMMONIA in the last 168 hours. CBC: Recent Labs  Lab 05/10/19 1827 05/11/19 0935  WBC 14.3* 13.7*  HGB 5.7* 6.8*  HCT 18.5* 20.9*  MCV 93.0 90.1  PLT 215 202   Cardiac Enzymes: No results for input(s): CKTOTAL, CKMB, CKMBINDEX, TROPONINI in the last 168 hours. BNP: BNP (last 3 results) Recent Labs    05/10/19 1924  BNP 1,144.8*    ProBNP (last 3 results) No results for input(s): PROBNP in the last 8760 hours.  CBG: Recent Labs  Lab  05/10/19 1827 05/10/19 2325 05/11/19 0252 05/11/19 0719 05/11/19 1107  GLUCAP 165* 124* 119* 121* 139*       Signed:  Dia Crawford, MD [facility] [phone] pager

## 2019-05-12 ENCOUNTER — Inpatient Hospital Stay (HOSPITAL_COMMUNITY)
Admission: EM | Admit: 2019-05-12 | Discharge: 2019-05-18 | DRG: 853 | Disposition: A | Discharge status: Home Health | Payer: Medicare Other | Attending: Internal Medicine | Admitting: Internal Medicine

## 2019-05-12 ENCOUNTER — Emergency Department (HOSPITAL_COMMUNITY): Payer: Medicare Other

## 2019-05-12 ENCOUNTER — Other Ambulatory Visit: Payer: Self-pay

## 2019-05-12 ENCOUNTER — Encounter (HOSPITAL_COMMUNITY): Payer: Self-pay

## 2019-05-12 DIAGNOSIS — Z8249 Family history of ischemic heart disease and other diseases of the circulatory system: Secondary | ICD-10-CM

## 2019-05-12 DIAGNOSIS — E872 Acidosis: Secondary | ICD-10-CM | POA: Diagnosis present

## 2019-05-12 DIAGNOSIS — J918 Pleural effusion in other conditions classified elsewhere: Secondary | ICD-10-CM | POA: Diagnosis present

## 2019-05-12 DIAGNOSIS — J189 Pneumonia, unspecified organism: Secondary | ICD-10-CM

## 2019-05-12 DIAGNOSIS — Z992 Dependence on renal dialysis: Secondary | ICD-10-CM

## 2019-05-12 DIAGNOSIS — I272 Pulmonary hypertension, unspecified: Secondary | ICD-10-CM | POA: Diagnosis present

## 2019-05-12 DIAGNOSIS — N184 Chronic kidney disease, stage 4 (severe): Secondary | ICD-10-CM

## 2019-05-12 DIAGNOSIS — Z1159 Encounter for screening for other viral diseases: Secondary | ICD-10-CM | POA: Diagnosis not present

## 2019-05-12 DIAGNOSIS — Z89422 Acquired absence of other left toe(s): Secondary | ICD-10-CM

## 2019-05-12 DIAGNOSIS — J9601 Acute respiratory failure with hypoxia: Secondary | ICD-10-CM | POA: Diagnosis present

## 2019-05-12 DIAGNOSIS — R7989 Other specified abnormal findings of blood chemistry: Secondary | ICD-10-CM | POA: Diagnosis not present

## 2019-05-12 DIAGNOSIS — E8770 Fluid overload, unspecified: Secondary | ICD-10-CM

## 2019-05-12 DIAGNOSIS — I5043 Acute on chronic combined systolic (congestive) and diastolic (congestive) heart failure: Secondary | ICD-10-CM | POA: Diagnosis present

## 2019-05-12 DIAGNOSIS — N136 Pyonephrosis: Secondary | ICD-10-CM | POA: Diagnosis present

## 2019-05-12 DIAGNOSIS — E875 Hyperkalemia: Secondary | ICD-10-CM | POA: Diagnosis present

## 2019-05-12 DIAGNOSIS — D631 Anemia in chronic kidney disease: Secondary | ICD-10-CM | POA: Diagnosis present

## 2019-05-12 DIAGNOSIS — K2980 Duodenitis without bleeding: Secondary | ICD-10-CM | POA: Diagnosis present

## 2019-05-12 DIAGNOSIS — N185 Chronic kidney disease, stage 5: Secondary | ICD-10-CM | POA: Diagnosis not present

## 2019-05-12 DIAGNOSIS — M25519 Pain in unspecified shoulder: Secondary | ICD-10-CM

## 2019-05-12 DIAGNOSIS — N390 Urinary tract infection, site not specified: Secondary | ICD-10-CM | POA: Diagnosis not present

## 2019-05-12 DIAGNOSIS — G4733 Obstructive sleep apnea (adult) (pediatric): Secondary | ICD-10-CM | POA: Diagnosis present

## 2019-05-12 DIAGNOSIS — R778 Other specified abnormalities of plasma proteins: Secondary | ICD-10-CM

## 2019-05-12 DIAGNOSIS — Q6211 Congenital occlusion of ureteropelvic junction: Secondary | ICD-10-CM | POA: Diagnosis not present

## 2019-05-12 DIAGNOSIS — I5033 Acute on chronic diastolic (congestive) heart failure: Secondary | ICD-10-CM | POA: Diagnosis not present

## 2019-05-12 DIAGNOSIS — D72825 Bandemia: Secondary | ICD-10-CM | POA: Diagnosis not present

## 2019-05-12 DIAGNOSIS — N19 Unspecified kidney failure: Secondary | ICD-10-CM | POA: Diagnosis present

## 2019-05-12 DIAGNOSIS — E118 Type 2 diabetes mellitus with unspecified complications: Secondary | ICD-10-CM | POA: Diagnosis present

## 2019-05-12 DIAGNOSIS — D649 Anemia, unspecified: Secondary | ICD-10-CM

## 2019-05-12 DIAGNOSIS — D62 Acute posthemorrhagic anemia: Secondary | ICD-10-CM | POA: Diagnosis present

## 2019-05-12 DIAGNOSIS — I214 Non-ST elevation (NSTEMI) myocardial infarction: Secondary | ICD-10-CM | POA: Diagnosis present

## 2019-05-12 DIAGNOSIS — Z8614 Personal history of Methicillin resistant Staphylococcus aureus infection: Secondary | ICD-10-CM

## 2019-05-12 DIAGNOSIS — I4891 Unspecified atrial fibrillation: Secondary | ICD-10-CM | POA: Diagnosis present

## 2019-05-12 DIAGNOSIS — I959 Hypotension, unspecified: Secondary | ICD-10-CM | POA: Diagnosis not present

## 2019-05-12 DIAGNOSIS — I350 Nonrheumatic aortic (valve) stenosis: Secondary | ICD-10-CM | POA: Diagnosis present

## 2019-05-12 DIAGNOSIS — E785 Hyperlipidemia, unspecified: Secondary | ICD-10-CM | POA: Diagnosis present

## 2019-05-12 DIAGNOSIS — E1122 Type 2 diabetes mellitus with diabetic chronic kidney disease: Secondary | ICD-10-CM | POA: Diagnosis present

## 2019-05-12 DIAGNOSIS — I480 Paroxysmal atrial fibrillation: Secondary | ICD-10-CM | POA: Diagnosis not present

## 2019-05-12 DIAGNOSIS — Z87891 Personal history of nicotine dependence: Secondary | ICD-10-CM

## 2019-05-12 DIAGNOSIS — I132 Hypertensive heart and chronic kidney disease with heart failure and with stage 5 chronic kidney disease, or end stage renal disease: Secondary | ICD-10-CM | POA: Diagnosis present

## 2019-05-12 DIAGNOSIS — N179 Acute kidney failure, unspecified: Secondary | ICD-10-CM | POA: Diagnosis present

## 2019-05-12 DIAGNOSIS — E669 Obesity, unspecified: Secondary | ICD-10-CM | POA: Diagnosis present

## 2019-05-12 DIAGNOSIS — W1830XA Fall on same level, unspecified, initial encounter: Secondary | ICD-10-CM | POA: Diagnosis present

## 2019-05-12 DIAGNOSIS — Y95 Nosocomial condition: Secondary | ICD-10-CM | POA: Diagnosis present

## 2019-05-12 DIAGNOSIS — K264 Chronic or unspecified duodenal ulcer with hemorrhage: Secondary | ICD-10-CM | POA: Diagnosis present

## 2019-05-12 DIAGNOSIS — J90 Pleural effusion, not elsewhere classified: Secondary | ICD-10-CM

## 2019-05-12 DIAGNOSIS — A419 Sepsis, unspecified organism: Principal | ICD-10-CM

## 2019-05-12 DIAGNOSIS — W19XXXD Unspecified fall, subsequent encounter: Secondary | ICD-10-CM | POA: Diagnosis not present

## 2019-05-12 DIAGNOSIS — Z5320 Procedure and treatment not carried out because of patient's decision for unspecified reasons: Secondary | ICD-10-CM | POA: Diagnosis present

## 2019-05-12 DIAGNOSIS — I509 Heart failure, unspecified: Secondary | ICD-10-CM | POA: Diagnosis not present

## 2019-05-12 DIAGNOSIS — R195 Other fecal abnormalities: Secondary | ICD-10-CM | POA: Diagnosis not present

## 2019-05-12 DIAGNOSIS — K2970 Gastritis, unspecified, without bleeding: Secondary | ICD-10-CM | POA: Diagnosis present

## 2019-05-12 DIAGNOSIS — K922 Gastrointestinal hemorrhage, unspecified: Secondary | ICD-10-CM | POA: Diagnosis not present

## 2019-05-12 DIAGNOSIS — R799 Abnormal finding of blood chemistry, unspecified: Secondary | ICD-10-CM

## 2019-05-12 DIAGNOSIS — Z79899 Other long term (current) drug therapy: Secondary | ICD-10-CM

## 2019-05-12 DIAGNOSIS — Z7984 Long term (current) use of oral hypoglycemic drugs: Secondary | ICD-10-CM

## 2019-05-12 DIAGNOSIS — Z95828 Presence of other vascular implants and grafts: Secondary | ICD-10-CM

## 2019-05-12 DIAGNOSIS — Z8719 Personal history of other diseases of the digestive system: Secondary | ICD-10-CM

## 2019-05-12 DIAGNOSIS — Z6836 Body mass index (BMI) 36.0-36.9, adult: Secondary | ICD-10-CM

## 2019-05-12 DIAGNOSIS — J9600 Acute respiratory failure, unspecified whether with hypoxia or hypercapnia: Secondary | ICD-10-CM | POA: Diagnosis present

## 2019-05-12 DIAGNOSIS — R0902 Hypoxemia: Secondary | ICD-10-CM

## 2019-05-12 DIAGNOSIS — R8281 Pyuria: Secondary | ICD-10-CM | POA: Diagnosis not present

## 2019-05-12 DIAGNOSIS — N186 End stage renal disease: Secondary | ICD-10-CM

## 2019-05-12 DIAGNOSIS — E861 Hypovolemia: Secondary | ICD-10-CM

## 2019-05-12 HISTORY — DX: Pulmonary hypertension, unspecified: I27.20

## 2019-05-12 HISTORY — DX: Gastrointestinal hemorrhage, unspecified: K92.2

## 2019-05-12 HISTORY — DX: End stage renal disease: N18.6

## 2019-05-12 HISTORY — DX: Non-ST elevation (NSTEMI) myocardial infarction: I21.4

## 2019-05-12 HISTORY — DX: Obesity, unspecified: E66.9

## 2019-05-12 HISTORY — DX: Nonrheumatic aortic (valve) stenosis: I35.0

## 2019-05-12 LAB — LACTIC ACID, PLASMA
Lactic Acid, Venous: 0.8 mmol/L (ref 0.5–1.9)
Lactic Acid, Venous: 0.8 mmol/L (ref 0.5–1.9)

## 2019-05-12 LAB — BPAM RBC
Blood Product Expiration Date: 202007212359
Blood Product Expiration Date: 202007232359
Blood Product Expiration Date: 202007232359
ISSUE DATE / TIME: 202006252033
ISSUE DATE / TIME: 202006260002
ISSUE DATE / TIME: 202006261202
Unit Type and Rh: 5100
Unit Type and Rh: 5100
Unit Type and Rh: 5100

## 2019-05-12 LAB — POC OCCULT BLOOD, ED: Fecal Occult Bld: POSITIVE — AB

## 2019-05-12 LAB — CBC WITH DIFFERENTIAL/PLATELET
Abs Immature Granulocytes: 0.11 10*3/uL — ABNORMAL HIGH (ref 0.00–0.07)
Basophils Absolute: 0 10*3/uL (ref 0.0–0.1)
Basophils Relative: 0 %
Eosinophils Absolute: 0.1 10*3/uL (ref 0.0–0.5)
Eosinophils Relative: 1 %
HCT: 21.7 % — ABNORMAL LOW (ref 39.0–52.0)
Hemoglobin: 6.9 g/dL — CL (ref 13.0–17.0)
Immature Granulocytes: 1 %
Lymphocytes Relative: 4 %
Lymphs Abs: 0.6 10*3/uL — ABNORMAL LOW (ref 0.7–4.0)
MCH: 29.1 pg (ref 26.0–34.0)
MCHC: 31.8 g/dL (ref 30.0–36.0)
MCV: 91.6 fL (ref 80.0–100.0)
Monocytes Absolute: 0.5 10*3/uL (ref 0.1–1.0)
Monocytes Relative: 4 %
Neutro Abs: 11.3 10*3/uL — ABNORMAL HIGH (ref 1.7–7.7)
Neutrophils Relative %: 90 %
Platelets: 181 10*3/uL (ref 150–400)
RBC: 2.37 MIL/uL — ABNORMAL LOW (ref 4.22–5.81)
RDW: 15 % (ref 11.5–15.5)
WBC: 12.5 10*3/uL — ABNORMAL HIGH (ref 4.0–10.5)
nRBC: 0.2 % (ref 0.0–0.2)

## 2019-05-12 LAB — COMPREHENSIVE METABOLIC PANEL
ALT: 15 U/L (ref 0–44)
AST: 13 U/L — ABNORMAL LOW (ref 15–41)
Albumin: 3 g/dL — ABNORMAL LOW (ref 3.5–5.0)
Alkaline Phosphatase: 50 U/L (ref 38–126)
Anion gap: 13 (ref 5–15)
BUN: 134 mg/dL — ABNORMAL HIGH (ref 8–23)
CO2: 18 mmol/L — ABNORMAL LOW (ref 22–32)
Calcium: 7.8 mg/dL — ABNORMAL LOW (ref 8.9–10.3)
Chloride: 101 mmol/L (ref 98–111)
Creatinine, Ser: 5.6 mg/dL — ABNORMAL HIGH (ref 0.61–1.24)
GFR calc Af Amer: 11 mL/min — ABNORMAL LOW (ref >60)
GFR calc non Af Amer: 9 mL/min — ABNORMAL LOW (ref >60)
Glucose, Bld: 182 mg/dL — ABNORMAL HIGH (ref 70–99)
Potassium: 4.7 mmol/L (ref 3.5–5.1)
Sodium: 132 mmol/L — ABNORMAL LOW (ref 135–145)
Total Bilirubin: 0.4 mg/dL (ref 0.3–1.2)
Total Protein: 5.8 g/dL — ABNORMAL LOW (ref 6.5–8.1)

## 2019-05-12 LAB — TYPE AND SCREEN
ABO/RH(D): O POS
Antibody Screen: NEGATIVE
Unit division: 0
Unit division: 0
Unit division: 0

## 2019-05-12 LAB — TROPONIN I (HIGH SENSITIVITY): Troponin I (High Sensitivity): 2283 ng/L (ref <18)

## 2019-05-12 LAB — URINALYSIS, ROUTINE W REFLEX MICROSCOPIC
Bilirubin Urine: NEGATIVE
Glucose, UA: 50 mg/dL — AB
Ketones, ur: NEGATIVE mg/dL
Nitrite: NEGATIVE
Protein, ur: 100 mg/dL — AB
Specific Gravity, Urine: 1.013 (ref 1.005–1.030)
WBC, UA: >50 WBC/hpf — ABNORMAL HIGH (ref 0–5)
pH: 5 (ref 5.0–8.0)

## 2019-05-12 LAB — PREPARE RBC (CROSSMATCH)

## 2019-05-12 LAB — NOVEL CORONAVIRUS, NAA (HOSP ORDER, SEND-OUT TO REF LAB; TAT 18-24 HRS): SARS-CoV-2, NAA: NOT DETECTED

## 2019-05-12 LAB — SARS CORONAVIRUS 2 BY RT PCR (HOSPITAL ORDER, PERFORMED IN ~~LOC~~ HOSPITAL LAB): SARS Coronavirus 2: NEGATIVE

## 2019-05-12 LAB — HEMOGLOBIN A1C
Hgb A1c MFr Bld: 5.9 % — ABNORMAL HIGH (ref 4.8–5.6)
Mean Plasma Glucose: 123 mg/dL

## 2019-05-12 LAB — GLUCOSE, CAPILLARY: Glucose-Capillary: 114 mg/dL — ABNORMAL HIGH (ref 70–99)

## 2019-05-12 LAB — BRAIN NATRIURETIC PEPTIDE: B Natriuretic Peptide: 1225.2 pg/mL — ABNORMAL HIGH (ref 0.0–100.0)

## 2019-05-12 MED ORDER — SODIUM CHLORIDE 0.9 % IV SOLN
2.0000 g | Freq: Once | INTRAVENOUS | Status: AC
  Administered 2019-05-12: 2 g via INTRAVENOUS
  Filled 2019-05-12: qty 2

## 2019-05-12 MED ORDER — PRAVASTATIN SODIUM 40 MG PO TABS
40.0000 mg | ORAL_TABLET | Freq: Every day | ORAL | Status: DC
  Administered 2019-05-12 – 2019-05-13 (×2): 40 mg via ORAL
  Filled 2019-05-12 (×2): qty 1

## 2019-05-12 MED ORDER — VANCOMYCIN HCL IN DEXTROSE 1-5 GM/200ML-% IV SOLN: 1000.0000 mg | INTRAVENOUS | Status: DC

## 2019-05-12 MED ORDER — NICOTINE POLACRILEX 4 MG MT LOZG
4.0000 mg | LOZENGE | OROMUCOSAL | Status: DC | PRN
  Filled 2019-05-12: qty 1

## 2019-05-12 MED ORDER — GUAIFENESIN ER 600 MG PO TB12
600.0000 mg | ORAL_TABLET | Freq: Two times a day (BID) | ORAL | Status: DC
  Administered 2019-05-13 – 2019-05-17 (×5): 600 mg via ORAL
  Filled 2019-05-12 (×11): qty 1

## 2019-05-12 MED ORDER — SODIUM CHLORIDE 0.9 % IV SOLN
10.0000 mL/h | Freq: Once | INTRAVENOUS | Status: AC
  Administered 2019-05-16: 10 mL/h via INTRAVENOUS

## 2019-05-12 MED ORDER — VANCOMYCIN HCL IN DEXTROSE 1-5 GM/200ML-% IV SOLN
1000.0000 mg | INTRAVENOUS | Status: DC
  Administered 2019-05-12: 1000 mg via INTRAVENOUS

## 2019-05-12 MED ORDER — SODIUM CHLORIDE 0.9 % IV BOLUS
1000.0000 mL | Freq: Once | INTRAVENOUS | Status: AC
  Administered 2019-05-12: 1000 mL via INTRAVENOUS

## 2019-05-12 MED ORDER — SODIUM CHLORIDE 0.9% FLUSH
3.0000 mL | Freq: Two times a day (BID) | INTRAVENOUS | Status: DC
  Administered 2019-05-12 – 2019-05-18 (×10): 3 mL via INTRAVENOUS

## 2019-05-12 MED ORDER — SODIUM ZIRCONIUM CYCLOSILICATE 10 G PO PACK
10.0000 g | PACK | ORAL | Status: DC
  Administered 2019-05-14: 10 g via ORAL
  Filled 2019-05-12 (×2): qty 1

## 2019-05-12 MED ORDER — ALBUTEROL SULFATE (2.5 MG/3ML) 0.083% IN NEBU: 2.5000 mg | INHALATION_SOLUTION | RESPIRATORY_TRACT | Status: DC | PRN

## 2019-05-12 MED ORDER — INSULIN ASPART 100 UNIT/ML ~~LOC~~ SOLN
0.0000 [IU] | Freq: Three times a day (TID) | SUBCUTANEOUS | Status: DC
  Administered 2019-05-13: 1 [IU] via SUBCUTANEOUS
  Administered 2019-05-14: 2 [IU] via SUBCUTANEOUS
  Administered 2019-05-14: 1 [IU] via SUBCUTANEOUS
  Administered 2019-05-15: 2 [IU] via SUBCUTANEOUS
  Administered 2019-05-15 – 2019-05-18 (×5): 1 [IU] via SUBCUTANEOUS

## 2019-05-12 MED ORDER — VANCOMYCIN HCL 10 G IV SOLR
2500.0000 mg | Freq: Once | INTRAVENOUS | Status: AC
  Administered 2019-05-12: 2500 mg via INTRAVENOUS
  Filled 2019-05-12: qty 2500

## 2019-05-12 MED ORDER — ACETAMINOPHEN 650 MG RE SUPP: 650.0000 mg | Freq: Four times a day (QID) | RECTAL | Status: DC | PRN

## 2019-05-12 MED ORDER — ACETAMINOPHEN 325 MG PO TABS: 650.0000 mg | ORAL_TABLET | Freq: Four times a day (QID) | ORAL | Status: DC | PRN

## 2019-05-12 MED ORDER — CALCITRIOL 0.25 MCG PO CAPS
0.2500 ug | ORAL_CAPSULE | ORAL | Status: DC
  Administered 2019-05-14 – 2019-05-18 (×3): 0.25 ug via ORAL
  Filled 2019-05-12 (×3): qty 1

## 2019-05-12 MED ORDER — SODIUM CHLORIDE 0.9 % IV BOLUS: 1000.0000 mL | Freq: Once | INTRAVENOUS | Status: AC

## 2019-05-12 MED ORDER — SODIUM CHLORIDE 0.9 % IV SOLN
2.0000 g | INTRAVENOUS | Status: DC
  Administered 2019-05-13 – 2019-05-16 (×4): 2 g via INTRAVENOUS
  Filled 2019-05-12 (×5): qty 2

## 2019-05-12 MED ORDER — SODIUM BICARBONATE 650 MG PO TABS
650.0000 mg | ORAL_TABLET | Freq: Every day | ORAL | Status: DC
  Administered 2019-05-13 – 2019-05-14 (×2): 650 mg via ORAL
  Filled 2019-05-12 (×2): qty 1

## 2019-05-12 MED ORDER — SODIUM CHLORIDE 0.9 % IV BOLUS
500.0000 mL | Freq: Once | INTRAVENOUS | Status: AC
  Administered 2019-05-12: 16:00:00 500 mL via INTRAVENOUS

## 2019-05-12 NOTE — Progress Notes (Signed)
Pharmacy Antibiotic Note  Louis Bradley is a 73 y.o. male admitted on 05/12/2019 with sepsis.  Pharmacy has been consulted for vancomycin and cefepime dosing. Pt is afebrile and WBC is elevated at 12.5. SCr is elevated but appears to be patients baseline. Lactic acid is WNL.   Plan: Cefepime 2gm IV Q24H Vancomycin 2500mg  IV x 1 then 1gm IV Q48H F/u renal fxn, C&S, clinical status and peak/trough at SS  Height: 6\' 3"  (190.5 cm) Weight: 296 lb 15.4 oz (134.7 kg) IBW/kg (Calculated) : 84.5  Temp (24hrs), Avg:96.1 F (35.6 C), Min:95.9 F (35.5 C), Max:96.3 F (35.7 C)  Recent Labs  Lab 05/10/19 1827 05/11/19 0935 05/12/19 1434 05/12/19 1438  WBC 14.3* 13.7* 12.5*  --   CREATININE 5.24* 5.29* 5.60*  --   LATICACIDVEN  --   --   --  0.8    Estimated Creatinine Clearance: 17.4 mL/min (A) (by C-G formula based on SCr of 5.6 mg/dL (H)).    No Known Allergies  Antimicrobials this admission: Vanc 6/27>> Cefepime 6/27>>  Dose adjustments this admission: N/A  Microbiology results: Pending  Thank you for allowing pharmacy to be a part of this patient's care.  Marwah Disbro, Rande Lawman 05/12/2019 4:34 PM

## 2019-05-12 NOTE — ED Notes (Signed)
ED TO INPATIENT HANDOFF REPORT  ED Nurse Name and Phone #:  Patty [phone]  S Name/Age/Gender Louis Bradley 73 y.o. male Room/Bed: 043C/043C  Code Status   Code Status: Prior  Home/SNF/Other Home Patient oriented to: self, place, time, and situation Is this baseline? Yes   Triage Complete: Triage complete  Chief Complaint Generalized weakness; shortness of breath  Triage Note Pt discharged last night after an over night admission for anemia.  Pt received 3 units of blood during admission.  Upon discharge pt reports that he still has some weakness and mild SOB but was feeling much better.  Today pt called EMS for increased SOB around 1200 and increased weakness.  Pt is pale with decreased cap refill, pale conjunctiva.  Pt is A&Ox4   Allergies No Known Allergies  Level of Care/Admitting Diagnosis ED Disposition    ED Disposition Condition Comment   Admit  Hospital Area: [facility] [[id number]]  Level of Care: Progressive [102]  Covid Evaluation: Screening Protocol (No Symptoms)  Diagnosis: Hypotension [694854]  Admitting Physician: Norval Morton [[id number]]  Attending Physician: Norval Morton [[id number]]  Estimated length of stay: past midnight tomorrow  Certification:: I certify this patient will need inpatient services for at least 2 midnights  PT Class (Do Not Modify): Inpatient [101]  PT Acc Code (Do Not Modify): Private [1]       B Medical/Surgery History Past Medical History:  Diagnosis Date  . Anemia   . Anesthesia complication    General anesthesia makes me feel foggy for long periods of time  . Arthritis   . CKD (chronic kidney disease)    Stage 4-5  . Complication of anesthesia    slow to come out of anesthesia - a lot of confusion   . Diabetes ([place])   . Fournier gangrene   . Heart murmur    Born with, no problems  . History of blood transfusion    x2  . Hyperlipidemia   . Hypertension   . OSA on CPAP   . Osteomyelitis ([place])  2016   L toe  . Renal atrophy, left   . Vitamin D deficiency    Past Surgical History:  Procedure Laterality Date  . AMPUTATION TOE Left 04/20/2019   Procedure: AMPUTATION LEFT SECOND AND THIRD TOES;  Surgeon: Newt Minion, MD;  Location: [facility];  Service: Orthopedics;  Laterality: Left;  . COLONOSCOPY    . COLOSTOMY  03/2015  . COLOSTOMY REVERSAL    . TOE AMPUTATION Left 2016   Left big toe amputated due to MRSA infection     A IV Location/Drains/Wounds Patient Lines/Drains/Airways Status   Active Line/Drains/Airways    Name:   Placement date:   Placement time:   Site:   Days:   Peripheral IV 05/11/19 Right Wrist   05/11/19    1202    Wrist   1   Peripheral IV 05/12/19 Left Antecubital   05/12/19    1357    Antecubital   less than 1   Incision (Closed) 04/20/19 Foot Left   04/20/19    1402     22          Intake/Output Last 24 hours No intake or output data in the 24 hours ending 05/12/19 1703  Labs/Imaging Results for orders placed or performed during the hospital encounter of 05/12/19 (from the past 48 hour(s))  Comprehensive metabolic panel     Status: Abnormal   Collection Time: 05/12/19  2:34 PM  Result Value Ref Range   Sodium 132 (L) 135 - 145 mmol/L   Potassium 4.7 3.5 - 5.1 mmol/L   Chloride 101 98 - 111 mmol/L   CO2 18 (L) 22 - 32 mmol/L   Glucose, Bld 182 (H) 70 - 99 mg/dL   BUN 134 (H) 8 - 23 mg/dL   Creatinine, Ser 5.60 (H) 0.61 - 1.24 mg/dL   Calcium 7.8 (L) 8.9 - 10.3 mg/dL   Total Protein 5.8 (L) 6.5 - 8.1 g/dL   Albumin 3.0 (L) 3.5 - 5.0 g/dL   AST 13 (L) 15 - 41 U/L   ALT 15 0 - 44 U/L   Alkaline Phosphatase 50 38 - 126 U/L   Total Bilirubin 0.4 0.3 - 1.2 mg/dL   GFR calc non Af Amer 9 (L) >60 mL/min   GFR calc Af Amer 11 (L) >60 mL/min   Anion gap 13 5 - 15    Comment: Performed at [facility], [place] [address] [place], [place] [address]  CBC WITH DIFFERENTIAL     Status: Abnormal   Collection Time: 05/12/19  2:34 PM  Result Value Ref  Range   WBC 12.5 (H) 4.0 - 10.5 K/uL   RBC 2.37 (L) 4.22 - 5.81 MIL/uL   Hemoglobin 6.9 (LL) 13.0 - 17.0 g/dL    Comment: REPEATED TO VERIFY THIS CRITICAL RESULT HAS VERIFIED AND BEEN CALLED TO K.CARTER RN BY KATHERINE MCCORMICK ON 06 27 2020 AT 6440, AND HAS BEEN READ BACK.     HCT 21.7 (L) 39.0 - 52.0 %   MCV 91.6 80.0 - 100.0 fL   MCH 29.1 26.0 - 34.0 pg   MCHC 31.8 30.0 - 36.0 g/dL   RDW 15.0 11.5 - 15.5 %   Platelets 181 150 - 400 K/uL    Comment: REPEATED TO VERIFY   nRBC 0.2 0.0 - 0.2 %   Neutrophils Relative % 90 %   Neutro Abs 11.3 (H) 1.7 - 7.7 K/uL   Lymphocytes Relative 4 %   Lymphs Abs 0.6 (L) 0.7 - 4.0 K/uL   Monocytes Relative 4 %   Monocytes Absolute 0.5 0.1 - 1.0 K/uL   Eosinophils Relative 1 %   Eosinophils Absolute 0.1 0.0 - 0.5 K/uL   Basophils Relative 0 %   Basophils Absolute 0.0 0.0 - 0.1 K/uL   Immature Granulocytes 1 %   Abs Immature Granulocytes 0.11 (H) 0.00 - 0.07 K/uL    Comment: Performed at [facility], [address] [place], [place] [address]  Type and screen [place]     Status: None (Preliminary result)   Collection Time: 05/12/19  2:34 PM  Result Value Ref Range   ABO/RH(D) O POS    Antibody Screen NEG    Sample Expiration      05/15/2019,2359 Performed at [facility], [place] [address], [place], [place] [address]    Unit Number [id number]    Blood Component Type RED CELLS,LR    Unit division 00    Status of Unit ALLOCATED    Transfusion Status OK TO TRANSFUSE    Crossmatch Result Compatible   SARS Coronavirus 2 (CEPHEID- Performed in [facility] hospital lab), Hosp Order     Status: None   Collection Time: 05/12/19  2:34 PM   Specimen: Nasopharyngeal Swab  Result Value Ref Range   SARS Coronavirus 2 NEGATIVE NEGATIVE    Comment: (NOTE) If result is NEGATIVE SARS-CoV-2  target nucleic acids are NOT DETECTED. The SARS-CoV-2 RNA is generally detectable in upper and lower  respiratory specimens  during the acute phase of infection. The lowest  concentration of SARS-CoV-2 viral copies this assay can detect is 250  copies / mL. A negative result does not preclude SARS-CoV-2 infection  and should not be used as the sole basis for treatment or other  patient management decisions.  A negative result may occur with  improper specimen collection / handling, submission of specimen other  than nasopharyngeal swab, presence of viral mutation(s) within the  areas targeted by this assay, and inadequate number of viral copies  (<250 copies / mL). A negative result must be combined with clinical  observations, patient history, and epidemiological information. If result is POSITIVE SARS-CoV-2 target nucleic acids are DETECTED. The SARS-CoV-2 RNA is generally detectable in upper and lower  respiratory specimens dur ing the acute phase of infection.  Positive  results are indicative of active infection with SARS-CoV-2.  Clinical  correlation with patient history and other diagnostic information is  necessary to determine patient infection status.  Positive results do  not rule out bacterial infection or co-infection with other viruses. If result is PRESUMPTIVE POSTIVE SARS-CoV-2 nucleic acids MAY BE PRESENT.   A presumptive positive result was obtained on the submitted specimen  and confirmed on repeat testing.  While 2019 novel coronavirus  (SARS-CoV-2) nucleic acids may be present in the submitted sample  additional confirmatory testing may be necessary for epidemiological  and / or clinical management purposes  to differentiate between  SARS-CoV-2 and other Sarbecovirus currently known to infect humans.  If clinically indicated additional testing with an alternate test  methodology [phone]) is advised. The SARS-CoV-2 RNA is generally  detectable in upper and lower respiratory sp ecimens during the acute  phase of infection. The expected result is Negative. Fact Sheet for Patients:   StrictlyIdeas.no Fact Sheet for Healthcare Providers: BankingDealers.co.za This test is not yet approved or cleared by the [place] FDA and has been authorized for detection and/or diagnosis of SARS-CoV-2 by FDA under an Emergency Use Authorization (EUA).  This EUA will remain in effect (meaning this test can be used) for the duration of the COVID-19 declaration under Section 564(b)(1) of the Act, 21 U.S.C. section 360bbb-3(b)(1), unless the authorization is terminated or revoked sooner. Performed at [facility], [place] [address] [place], [place] [address]   Troponin I (High Sensitivity)     Status: Abnormal   Collection Time: 05/12/19  2:34 PM  Result Value Ref Range   Troponin I (High Sensitivity) 2,283 (HH) <18 ng/L    Comment: CRITICAL RESULT CALLED TO, READ BACK BY AND VERIFIED WITH: REABREB, A RN @ 1556 ON 05/12/2019 BY TEMOCHE, H Performed at [facility], [place] [address] [place], [place] [address]   Lactic acid, plasma     Status: None   Collection Time: 05/12/19  2:38 PM  Result Value Ref Range   Lactic Acid, Venous 0.8 0.5 - 1.9 mmol/L    Comment: Performed at [place] [address] [place], [place] [address]  Prepare RBC     Status: None   Collection Time: 05/12/19  2:52 PM  Result Value Ref Range   Order Confirmation      ORDER PROCESSED BY BLOOD BANK Performed at [facility], [place] [address] [place], [place] [address]   POC occult blood, ED RN will collect  Status: Abnormal   Collection Time: 05/12/19  3:30 PM  Result Value Ref Range   Fecal Occult Bld POSITIVE (A) NEGATIVE  Urinalysis, Routine w reflex microscopic     Status: Abnormal   Collection Time: 05/12/19  4:00 PM  Result Value Ref Range   Color, Urine YELLOW YELLOW   APPearance HAZY (A) CLEAR   Specific Gravity, Urine 1.013 1.005 - 1.030   pH 5.0 5.0 - 8.0   Glucose, UA 50 (A) NEGATIVE mg/dL   Hgb urine dipstick  SMALL (A) NEGATIVE   Bilirubin Urine NEGATIVE NEGATIVE   Ketones, ur NEGATIVE NEGATIVE mg/dL   Protein, ur 100 (A) NEGATIVE mg/dL   Nitrite NEGATIVE NEGATIVE   Leukocytes,Ua LARGE (A) NEGATIVE   RBC / HPF 6-10 0 - 5 RBC/hpf   WBC, UA >50 (H) 0 - 5 WBC/hpf   Bacteria, UA RARE (A) NONE SEEN   Squamous Epithelial / LPF 0-5 0 - 5   Mucus PRESENT     Comment: Performed at [facility], [place] [address] [place], [place] [address]   Dg Chest Port 1 View  Result Date: 05/12/2019 CLINICAL DATA:  Cough EXAM: PORTABLE CHEST 1 VIEW COMPARISON:  May 10, 2019 FINDINGS: There is cardiomegaly with pulmonary vascularity within normal limits. There are small pleural effusions bilaterally. There is consolidation in each lung base. Lungs elsewhere clear. No adenopathy. No bone lesions. IMPRESSION: Bibasilar airspace consolidation, most likely representing multifocal pneumonia. Small pleural effusions bilaterally. No evident interstitial edema. There is cardiomegaly. Electronically Signed   By: Lowella Grip III M.D.   On: 05/12/2019 16:12   Dg Chest Portable 1 View  Result Date: 05/10/2019 CLINICAL DATA:  Dizziness. EXAM: PORTABLE CHEST 1 VIEW COMPARISON:  None. FINDINGS: 1919 hours. Low lung volumes. Interstitial markings are diffusely coarsened with chronic features. Bibasilar patchy airspace disease suggests atelectasis or infiltrate. No substantial pleural effusion. Cardiopericardial silhouette is at upper limits of normal for size. Bones are diffusely demineralized. Telemetry leads overlie the chest. IMPRESSION: Low lung volumes with bibasilar atelectasis or infiltrate. Electronically Signed   By: Misty Stanley M.D.   On: 05/10/2019 20:09    Pending Labs Unresulted Labs (From admission, onward)    Start     Ordered   05/12/19 1649  Urine Culture  Once,   STAT     05/12/19 1648   05/12/19 1427  Lactic acid, plasma  Now then every 2 hours,   STAT     05/12/19 1427   05/12/19 1427  Blood Culture  (routine x 2)  BLOOD CULTURE X 2,   STAT    Question:  Patient immune status  Answer:  Normal   05/12/19 1427          Vitals/Pain Today's Vitals   05/12/19 1602 05/12/19 1615 05/12/19 1630 05/12/19 1645  BP:  103/65 105/61 100/67  Pulse:      Resp:  18 (!) 21 (!) 22  Temp:      TempSrc:      SpO2:      Weight: 134.7 kg     Height: 6\' 3"  (1.905 m)     PainSc:        Isolation Precautions Airborne and Contact precautions  Medications Medications  0.9 %  sodium chloride infusion (has no administration in time range)  ceFEPIme (MAXIPIME) 2 g in sodium chloride 0.9 % 100 mL IVPB (2 g Intravenous New Bag/Given 05/12/19 1702)  vancomycin (VANCOCIN) 2,500 mg in sodium chloride 0.9 % 500  mL IVPB (has no administration in time range)  ceFEPIme (MAXIPIME) 2 g in sodium chloride 0.9 % 100 mL IVPB (has no administration in time range)  vancomycin (VANCOCIN) IVPB 1000 mg/200 mL premix (has no administration in time range)  sodium chloride 0.9 % bolus 500 mL (500 mLs Intravenous New Bag/Given 05/12/19 1559)    Mobility 3-wheel electric cart High fall risk   Focused Assessments    R Recommendations: See Admitting Provider Note  Report given to:   Additional Notes:

## 2019-05-12 NOTE — ED Provider Notes (Addendum)
[facility] EMERGENCY DEPARTMENT Provider Note   CSN: [id number] Arrival date & time: 05/12/19  1355     History   Chief Complaint Chief Complaint  Patient presents with  . Shortness of Breath    HPI Louis Bradley is a 73 y.o. male.     Patient c/o increased generalized weakness, and sob/dyspnea w exertion in the past day since d/c from hospital with same. Symptoms gradual onset, mod-severe, worsening, constant. Worse when stands or tries to do anything. Patient was hospitalized with gi bleeding, and also noted with elevated troponin - endoscopy and cardiac cath was recommended - pt had declined due to concern CRI. Patient notes since d/c persistence and slow worsening of these same symptoms. Denies syncope. No chest pain or chest discomfort. No cough or sore throat. No known covid + exposure. Denies abd or flank pain. States stools have been very dark/dark brown, no gross blood. Denies fever or chills.   The history is provided by the patient and the EMS personnel.  Shortness of Breath Associated symptoms: no abdominal pain, no chest pain, no cough, no fever, no headaches, no neck pain, no rash, no sore throat and no vomiting     Past Medical History:  Diagnosis Date  . Anemia   . Anesthesia complication    General anesthesia makes me feel foggy for long periods of time  . Arthritis   . CKD (chronic kidney disease)    Stage 4-5  . Complication of anesthesia    slow to come out of anesthesia - a lot of confusion   . Diabetes ([place])   . Fournier gangrene   . Heart murmur    Born with, no problems  . History of blood transfusion    x2  . Hyperlipidemia   . Hypertension   . OSA on CPAP   . Osteomyelitis ([place]) 2016   L toe  . Renal atrophy, left   . Vitamin D deficiency     Patient Active Problem List   Diagnosis Date Noted  . Acute GI bleeding 05/10/2019  . Acute blood loss anemia 05/10/2019  . Amputated toe, left ([place]) 05/02/2019  .  Onychomycosis 05/02/2019  . Venous insufficiency of both lower extremities 05/02/2019  . Hyperlipidemia associated with type 2 diabetes mellitus ([place]) 04/26/2019  . Osteomyelitis of third toe of left foot ([place]) 04/17/2019  . Osteomyelitis of second toe of left foot ([place]) 04/17/2019  . Bilateral primary osteoarthritis of knee 04/17/2019  . PCP NOTES >>>>>>>>>>>>>>> 03/01/2019  . Aortic stenosis 12/18/2018  . Iron deficiency anemia 09/09/2018  . S/P colostomy ([place]) 09/05/2016  . Vitamin D deficiency 04/26/2016  . Chronic renal failure in pediatric patient, stage 4 (severe) ([place]) 04/09/2015  . Diabetes mellitus type 2 with complications ([place]) 08/65/7846  . Essential hypertension 04/08/2015  . Hyponatremia 04/08/2015  . Hyperkalemia 05/22/2012    Past Surgical History:  Procedure Laterality Date  . AMPUTATION TOE Left 04/20/2019   Procedure: AMPUTATION LEFT SECOND AND THIRD TOES;  Surgeon: Newt Minion, MD;  Location: [facility];  Service: Orthopedics;  Laterality: Left;  . COLONOSCOPY    . COLOSTOMY  03/2015  . COLOSTOMY REVERSAL    . TOE AMPUTATION Left 2016   Left big toe amputated due to MRSA infection        Home Medications    Prior to Admission medications   Medication Sig Start Date End Date Taking? Authorizing Provider  amLODipine (NORVASC) 10 MG tablet Take 1 tablet (  10 mg total) by mouth daily. 03/13/19  Yes Colon Branch, MD  calcitRIOL (ROCALTROL) 0.25 MCG capsule Take 0.25 mcg by mouth every Monday, Wednesday, and Friday.  03/22/19  Yes [provider]  carvedilol (COREG) 25 MG tablet Take 1 tablet (25 mg total) by mouth 2 (two) times daily with a meal. Patient taking differently: Take 25 mg by mouth 2 (two) times a day.  03/13/19  Yes Paz, Alda Berthold, MD  furosemide (LASIX) 40 MG tablet Take 2 tablets (80 mg total) by mouth daily. 03/13/19  Yes Paz, Alda Berthold, MD  glipiZIDE (GLUCOTROL) 5 MG tablet Take 2 tablets (10 mg total) by mouth 2 (two) times daily. 04/20/19  Yes Paz,  Alda Berthold, MD  nicotine polacrilex (COMMIT) 4 MG lozenge Take 4 mg by mouth as needed for smoking cessation.   Yes [provider]  pantoprazole (PROTONIX) 40 MG tablet Take 1 tablet (40 mg total) by mouth 2 (two) times daily. 05/11/19  Yes Allie Bossier, MD  pravastatin (PRAVACHOL) 40 MG tablet Take 1 tablet (40 mg total) by mouth at bedtime. 03/13/19  Yes Colon Branch, MD  Probiotic Product (PROBIOTIC DAILY PO) Take 1 tablet by mouth every Monday, Wednesday, and Friday.    Yes [provider]  sodium bicarbonate 650 MG tablet Take 650 mg by mouth daily. 03/20/19  Yes [provider]  sodium zirconium cyclosilicate (LOKELMA) 10 g PACK packet Take 10 g by mouth daily. Use as directed by nephrology Patient taking differently: Take 10 g by mouth every Monday, Wednesday, and Friday. Use as directed by nephrology 04/16/19  Yes Copland, Gay Filler, MD    Family History Family History  Problem Relation Age of Onset  . CAD Father 43  . CAD Other        2 uncles had heart attacks  . Colon cancer Neg Hx   . Prostate cancer Neg Hx     Social History Social History   Tobacco Use  . Smoking status: Former Research scientist (life sciences)  . Smokeless tobacco: Never Used  . Tobacco comment: quit 12/2018 (1 ppd)  Substance Use Topics  . Alcohol use: Yes    Comment: social  . Drug use: Never     Allergies   Patient has no known allergies.   Review of Systems Review of Systems  Constitutional: Negative for chills and fever.  HENT: Negative for sore throat.   Eyes: Negative for redness.  Respiratory: Positive for shortness of breath. Negative for cough.   Cardiovascular: Negative for chest pain.  Gastrointestinal: Negative for abdominal pain, diarrhea and vomiting.  Endocrine: Negative for polyuria.  Genitourinary: Negative for dysuria and flank pain.  Musculoskeletal: Negative for back pain and neck pain.  Skin: Negative for rash.  Neurological: Positive for weakness and light-headedness.  Negative for syncope, speech difficulty, numbness and headaches.  Hematological: Does not bruise/bleed easily.  Psychiatric/Behavioral: Negative for confusion.     Physical Exam Updated Vital Signs BP (!) 88/49   Pulse (!) 58   Temp (!) 96.3 F (35.7 C) (Oral) Comment: 96.1 Axillary  Resp (!) 21   Ht 1.93 m (6\' 4" )   Wt 134.7 kg   SpO2 94%   BMI 36.15 kg/m   Physical Exam Vitals signs and nursing note reviewed.  Constitutional:      Appearance: Normal appearance. Louis Bradley is well-developed.  HENT:     Head: Atraumatic.     Nose: Nose normal.     Mouth/Throat:  Mouth: Mucous membranes are moist.     Pharynx: Oropharynx is clear.  Eyes:     General: No scleral icterus. Neck:     Musculoskeletal: Normal range of motion and neck supple. No neck rigidity.     Trachea: No tracheal deviation.  Cardiovascular:     Rate and Rhythm: Normal rate and regular rhythm.     Pulses: Normal pulses.     Heart sounds: Normal heart sounds. No murmur. No friction rub. No gallop.   Pulmonary:     Effort: Pulmonary effort is normal. No accessory muscle usage or respiratory distress.     Breath sounds: Normal breath sounds.  Abdominal:     General: Bowel sounds are normal. There is no distension.     Palpations: Abdomen is soft. There is no mass.     Tenderness: There is no abdominal tenderness. There is no guarding or rebound.     Hernia: No hernia is present.  Genitourinary:    Comments: No cva tenderness. Musculoskeletal:        General: No swelling.     Right lower leg: No edema.     Left lower leg: No edema.     Comments: Healed surgical wound left foot/toes without sign of infection.   Skin:    General: Skin is warm and dry.     Findings: No rash.  Neurological:     Mental Status: Louis Bradley is alert.     Comments: Alert, speech clear/fluent. Motor/sens grossly intact bil.   Psychiatric:        Mood and Affect: Mood normal.      ED Treatments / Results  Labs (all labs ordered are  listed, but only abnormal results are displayed) Results for orders placed or performed during the hospital encounter of 05/12/19  SARS Coronavirus 2 (CEPHEID- Performed in [facility] hospital lab), [facility] Order   Specimen: Nasopharyngeal Swab  Result Value Ref Range   SARS Coronavirus 2 NEGATIVE NEGATIVE  Lactic acid, plasma  Result Value Ref Range   Lactic Acid, Venous 0.8 0.5 - 1.9 mmol/L  Comprehensive metabolic panel  Result Value Ref Range   Sodium 132 (L) 135 - 145 mmol/L   Potassium 4.7 3.5 - 5.1 mmol/L   Chloride 101 98 - 111 mmol/L   CO2 18 (L) 22 - 32 mmol/L   Glucose, Bld 182 (H) 70 - 99 mg/dL   BUN 134 (H) 8 - 23 mg/dL   Creatinine, Ser 5.60 (H) 0.61 - 1.24 mg/dL   Calcium 7.8 (L) 8.9 - 10.3 mg/dL   Total Protein 5.8 (L) 6.5 - 8.1 g/dL   Albumin 3.0 (L) 3.5 - 5.0 g/dL   AST 13 (L) 15 - 41 U/L   ALT 15 0 - 44 U/L   Alkaline Phosphatase 50 38 - 126 U/L   Total Bilirubin 0.4 0.3 - 1.2 mg/dL   GFR calc non Af Amer 9 (L) >60 mL/min   GFR calc Af Amer 11 (L) >60 mL/min   Anion gap 13 5 - 15  CBC WITH DIFFERENTIAL  Result Value Ref Range   WBC 12.5 (H) 4.0 - 10.5 K/uL   RBC 2.37 (L) 4.22 - 5.81 MIL/uL   Hemoglobin 6.9 (LL) 13.0 - 17.0 g/dL   HCT 21.7 (L) 39.0 - 52.0 %   MCV 91.6 80.0 - 100.0 fL   MCH 29.1 26.0 - 34.0 pg   MCHC 31.8 30.0 - 36.0 g/dL   RDW 15.0 11.5 - 15.5 %  Platelets 181 150 - 400 K/uL   nRBC 0.2 0.0 - 0.2 %   Neutrophils Relative % 90 %   Neutro Abs 11.3 (H) 1.7 - 7.7 K/uL   Lymphocytes Relative 4 %   Lymphs Abs 0.6 (L) 0.7 - 4.0 K/uL   Monocytes Relative 4 %   Monocytes Absolute 0.5 0.1 - 1.0 K/uL   Eosinophils Relative 1 %   Eosinophils Absolute 0.1 0.0 - 0.5 K/uL   Basophils Relative 0 %   Basophils Absolute 0.0 0.0 - 0.1 K/uL   Immature Granulocytes 1 %   Abs Immature Granulocytes 0.11 (H) 0.00 - 0.07 K/uL  Troponin I (High Sensitivity)  Result Value Ref Range   Troponin I (High Sensitivity) 2,283 (HH) <18 ng/L  POC occult blood, ED  RN will collect  Result Value Ref Range   Fecal Occult Bld POSITIVE (A) NEGATIVE  Type and screen [place]  Result Value Ref Range   ABO/RH(D) O POS    Antibody Screen NEG    Sample Expiration      05/15/2019,2359 Performed at [facility], [address] [place], [place] [address]    Unit Number [id number]    Blood Component Type RED CELLS,LR    Unit division 00    Status of Unit ALLOCATED    Transfusion Status OK TO TRANSFUSE    Crossmatch Result Compatible   Prepare RBC  Result Value Ref Range   Order Confirmation      ORDER PROCESSED BY BLOOD BANK Performed at [facility], [place] [address], [place],  [address]   BP[facility]  Result Value Ref Range   Blood Product Unit Number [id number]    PRODUCT CODE F0932T55    Unit Type and Rh 5100    Blood Product Expiration Date 732202542706    Dg Chest Port 1 View  Result Date: 05/12/2019 CLINICAL DATA:  Cough EXAM: PORTABLE CHEST 1 VIEW COMPARISON:  May 10, 2019 FINDINGS: There is cardiomegaly with pulmonary vascularity within normal limits. There are small pleural effusions bilaterally. There is consolidation in each lung base. Lungs elsewhere clear. No adenopathy. No bone lesions. IMPRESSION: Bibasilar airspace consolidation, most likely representing multifocal pneumonia. Small pleural effusions bilaterally. No evident interstitial edema. There is cardiomegaly. Electronically Signed   By: Lowella Grip III M.D.   On: 05/12/2019 16:12   Dg Chest Portable 1 View  Result Date: 05/10/2019 CLINICAL DATA:  Dizziness. EXAM: PORTABLE CHEST 1 VIEW COMPARISON:  None. FINDINGS: 1919 hours. Low lung volumes. Interstitial markings are diffusely coarsened with chronic features. Bibasilar patchy airspace disease suggests atelectasis or infiltrate. No substantial pleural effusion. Cardiopericardial silhouette is at upper limits of normal for size. Bones are diffusely demineralized. Telemetry leads  overlie the chest. IMPRESSION: Low lung volumes with bibasilar atelectasis or infiltrate. Electronically Signed   By: Misty Stanley M.D.   On: 05/10/2019 20:09    EKG EKG Interpretation  Date/Time:  Saturday May 12 2019 14:07:24 EDT Ventricular Rate:  82 PR Interval:    QRS Duration: 113 QT Interval:  447 QTC Calculation: 523 R Axis:   24 Text Interpretation:  Sinus rhythm Nonspecific ST abnormality Prolonged QT interval `similar st appearance on prior ECG Confirmed by Lajean Saver [id number]) on 05/12/2019 2:30:39 PM   Radiology Dg Chest Portable 1 View  Result Date: 05/10/2019 CLINICAL DATA:  Dizziness. EXAM: PORTABLE CHEST 1 VIEW COMPARISON:  None. FINDINGS: 1919 hours. Low lung volumes. Interstitial markings are diffusely coarsened with chronic  features. Bibasilar patchy airspace disease suggests atelectasis or infiltrate. No substantial pleural effusion. Cardiopericardial silhouette is at upper limits of normal for size. Bones are diffusely demineralized. Telemetry leads overlie the chest. IMPRESSION: Low lung volumes with bibasilar atelectasis or infiltrate. Electronically Signed   By: Misty Stanley M.D.   On: 05/10/2019 20:09    Procedures Procedures (including critical care time)  Medications Ordered in ED Medications  0.9 %  sodium chloride infusion (has no administration in time range)     Initial Impression / Assessment and Plan / ED Course  I have reviewed the triage vital signs and the nursing notes.  Pertinent labs & imaging results that were available during my care of the patient were reviewed by me and considered in my medical decision making (see chart for details).  Iv ns. Stat labs. Continuous pulse ox and monitor. o2 [place].   Initial bp low, ns bolus. 2nd iv line placed. Possible code sepsis activated. Cultures sent.   ECG. Pcxr.   Hemoglobin was recently very low w gi bleeding - prbc ordered.   Reviewed nursing notes and prior charts for additional history.    Labs reviewed by me - CRF. hgb low, similar to prior. Emergent transfusion prbc given low bp, symptomatic anemia, heme pos stools.   CXR reviewed by me - basilar infiltrate/pna. Iv abx.   Recheck pt improved.  Medicine service consulted for admission.  Additional labs reviewed, covid neg. Lactate normal. Pt with progression increased bun, crf + gi bleeding.    Louis Bradley was evaluated in Emergency Department on 05/12/2019 for the symptoms described in the history of present illness. Louis Bradley was evaluated in the context of the global COVID-19 pandemic, which necessitated consideration that the patient might be at risk for infection with the SARS-CoV-2 virus that causes COVID-19. Institutional protocols and algorithms that pertain to the evaluation of patients at risk for COVID-19 are in a state of rapid change based on information released by regulatory bodies including the CDC and federal and state organizations. These policies and algorithms were followed during the patient's care in the ED.   CRITICAL CARE RE: hypotension, hypothermia, r/o sepsis, CRF, symptomatic/severe anemia, heme pos stools, prbc transfusion, pna. Performed by: Mirna Mires Total critical care time: 45 minutes Critical care time was exclusive of separately billable procedures and treating other patients. Critical care was necessary to treat or prevent imminent or life-threatening deterioration. Critical care was time spent personally by me on the following activities: development of treatment plan with patient and/or surrogate as well as nursing, discussions with consultants, evaluation of patient's response to treatment, examination of patient, obtaining history from patient or surrogate, ordering and performing treatments and interventions, ordering and review of laboratory studies, ordering and review of radiographic studies, pulse oximetry and re-evaluation of patient's condition.  Staff had taken pt to bathroom in  ED, felt lightheaded, mis-stepped, fall. No loc. On recheck pt, no headache. CTLS spine, non tender, aligned, no step off. Mild right shoulder/right trap tenderness. Good rom right shoulder without pain. Pt indicates feels fine. bp is improved from prior.   I spoke with pts wife, at length, updating on plan. She indicates is pts POA, and that admitting team/consultants speak with her, as she feels pt will refuse testing/intervention.    Final Clinical Impressions(s) / ED Diagnoses   Final diagnoses:  None    ED Discharge Orders    None           Lajean Saver,  MD 05/12/19 6244

## 2019-05-12 NOTE — ED Triage Notes (Signed)
Pt discharged last night after an over night admission for anemia.  Pt received 3 units of blood during admission.  Upon discharge pt reports that he still has some weakness and mild SOB but was feeling much better.  Today pt called EMS for increased SOB around 1200 and increased weakness.  Pt is pale with decreased cap refill, pale conjunctiva.  Pt is A&Ox4

## 2019-05-12 NOTE — ED Notes (Signed)
Post fall assessment completed at 1526

## 2019-05-12 NOTE — Progress Notes (Signed)
Notified provider of need to order fluid bolus.  ?

## 2019-05-12 NOTE — Progress Notes (Addendum)
Due to signs of recurrent hypotension 2 L of normal saline IV fluids ordered for the next 2 hours.  If showing signs of respiratory distress or continued hypotension will need to consult PCCM for assistance with management.

## 2019-05-12 NOTE — Consult Note (Signed)
Renal Service Consult Note [facility]  Louis [facility] 05/12/2019 Sol Blazing Requesting Physician:  Dr Har[place]  Reason for Consult:  CKD stage IV/V HPI: The patient is a 73 y.o. year-old with hx of HTN, DM2, osteomyelitis, HL, hx fourneir's gangrene, and CKD stage 4/5 f/b Dr Royce Macadamia at [facility].  He moved here from [place] 2 mos ago. Patient was just admitted from 6/25 and dc'd yesterday on 6/26, was admitted for acute GIB, CKD IV. He presented w/ dizziness walking, was hypotensive in ED w/ Hb 5.7 and +trop. Rec'd 2u prbc, refused EGD and was dc'd on 6/26.  Prior to this he was admitted in early June 2020 for amputation of the L 2nd and 3rd toe.  Creat was 4.76 then and today is up to 5.60.  We are asked to see for CKD.    Patient reports poor appetitie, no N/V, +DOE, no orthopnea, cough or leg swelling.  Pt is getting prbc's and NS bolus now and is w/o complaint or SOB.  Temps are down in 95- 96 range, CXR shoed bilat basilar infiltrates c/w PNA.  Pt denies cough or SOB at this time.    ROS  denies CP  no joint pain   no HA  no blurry vision  no rash  no diarrhea  no nausea/ vomiting  no dysuria  no difficulty voiding  no change in urine color    Past Medical History  Past Medical History:  Diagnosis Date  . Anemia   . Anesthesia complication    General anesthesia makes me feel foggy for long periods of time  . Arthritis   . CKD (chronic kidney disease)    Stage 4-5  . Complication of anesthesia    slow to come out of anesthesia - a lot of confusion   . Diabetes ([place])   . Fournier gangrene   . Heart murmur    Born with, no problems  . History of blood transfusion    x2  . Hyperlipidemia   . Hypertension   . OSA on CPAP   . Osteomyelitis ([place]) 2016   L toe  . Renal atrophy, left   . Vitamin D deficiency    Past Surgical History  Past Surgical History:  Procedure Laterality Date  . AMPUTATION TOE Left 04/20/2019   Procedure: AMPUTATION LEFT SECOND AND  THIRD TOES;  Surgeon: Newt Minion, MD;  Location: [facility];  Service: Orthopedics;  Laterality: Left;  . COLONOSCOPY    . COLOSTOMY  03/2015  . COLOSTOMY REVERSAL    . TOE AMPUTATION Left 2016   Left big toe amputated due to MRSA infection   Family History  Family History  Problem Relation Age of Onset  . CAD Father 22  . CAD Other        2 uncles had heart attacks  . Colon cancer Neg Hx   . Prostate cancer Neg Hx    Social History  reports that he has quit smoking. He has never used smokeless tobacco. He reports current alcohol use. He reports that he does not use drugs. Allergies No Known Allergies Home medications Prior to Admission medications   Medication Sig Start Date End Date Taking? Authorizing Provider  amLODipine (NORVASC) 10 MG tablet Take 1 tablet (10 mg total) by mouth daily. 03/13/19  Yes Colon Branch, MD  calcitRIOL (ROCALTROL) 0.25 MCG capsule Take 0.25 mcg by mouth every Monday, Wednesday, and Friday.  03/22/19  Yes [provider]  carvedilol (COREG) 25 MG tablet Take 1 tablet (25 mg total) by mouth 2 (two) times daily with a meal. Patient taking differently: Take 25 mg by mouth 2 (two) times a day.  03/13/19  Yes Paz, Alda Berthold, MD  furosemide (LASIX) 40 MG tablet Take 2 tablets (80 mg total) by mouth daily. 03/13/19  Yes Paz, Alda Berthold, MD  glipiZIDE (GLUCOTROL) 5 MG tablet Take 2 tablets (10 mg total) by mouth 2 (two) times daily. 04/20/19  Yes Paz, Alda Berthold, MD  nicotine polacrilex (COMMIT) 4 MG lozenge Take 4 mg by mouth as needed for smoking cessation.   Yes [provider]  pantoprazole (PROTONIX) 40 MG tablet Take 1 tablet (40 mg total) by mouth 2 (two) times daily. 05/11/19  Yes Allie Bossier, MD  pravastatin (PRAVACHOL) 40 MG tablet Take 1 tablet (40 mg total) by mouth at bedtime. 03/13/19  Yes Colon Branch, MD  Probiotic Product (PROBIOTIC DAILY PO) Take 1 tablet by mouth every Monday, Wednesday, and Friday.    Yes [provider]  sodium  bicarbonate 650 MG tablet Take 650 mg by mouth daily. 03/20/19  Yes [provider]  sodium zirconium cyclosilicate (LOKELMA) 10 g PACK packet Take 10 g by mouth daily. Use as directed by nephrology Patient taking differently: Take 10 g by mouth every Monday, Wednesday, and Friday. Use as directed by nephrology 04/16/19  Yes Copland, Gay Filler, MD   Liver Function Tests Recent Labs  Lab 05/10/19 1924 05/12/19 1434  AST 18 13*  ALT 19 15  ALKPHOS 52 50  BILITOT 0.5 0.4  PROT 6.1* 5.8*  ALBUMIN 3.2* 3.0*   No results for input(s): LIPASE, AMYLASE in the last 168 hours. CBC Recent Labs  Lab 05/10/19 1827 05/11/19 0935 05/12/19 1434  WBC 14.3* 13.7* 12.5*  NEUTROABS  --   --  11.3*  HGB 5.7* 6.8* 6.9*  HCT 18.5* 20.9* 21.7*  MCV 93.0 90.1 91.6  PLT 215 202 810   Basic Metabolic Panel Recent Labs  Lab 05/10/19 1827 05/11/19 0935 05/12/19 1434  NA 135 137 132*  K 5.2* 4.6 4.7  CL 105 106 101  CO2 22 20* 18*  GLUCOSE 176* 139* 182*  BUN 115* 119* 134*  CREATININE 5.24* 5.29* 5.60*  CALCIUM 8.1* 8.1* 7.8*   Iron/TIBC/Ferritin/ %Sat    Component Value Date/Time   IRON 74 03/19/2019   TIBC 289 03/19/2019   FERRITIN 224 03/19/2019   IRONPCTSAT 26 03/19/2019    Vitals:   05/12/19 2015 05/12/19 2020 05/12/19 2045 05/12/19 2050  BP: (!) 96/56 (!) 96/56 (!) 96/55   Pulse: (!) 29 70 73 (!) 56  Resp: 19 18 18 20   Temp:  (!) 97.4 F (36.3 C)  98.5 F (36.9 C)  TempSrc:  Oral  Oral  SpO2: 97% 97% 97% 99%  Weight:      Height:        Exam Gen alert obese WM no distress No rash, cyanosis or gangrene Sclera anicteric, throat clear  No jvd or bruits , flat neck veins Chest clear bilat to bases, no rales/ wheezing RRR no MRG Abd soft ntnd no mass or ascites +bs  GU normal male MS no joint effusions or deformity Ext trace bilat pretib edema, no wounds or ulcers Neuro is alert, Ox 3 , nf    Home meds:  - amlodipine 10/ carvedilol 25 bid  - glipizide 10mg   bid  - pravastatin 40 hs/ pantoprazole 40 bid/ nicotine  gum  - sodium zirconium cyclosilicate 84RQ q mwf   CXR 6/27 > bilateral basilar infiltrates, no evident edema     Assessment/ Plan: 1. CKD stage IV/V - creat worsening as compared to April 2020 when creat was 4-5 range, may be acute due to hypotension and hypoperfusion. Is getting NS bolus and prbc's now and BP's improving into 90's.  K+ ok, pt not grossly uremic or vol overloaded.  No indication for RRT at this time.  F/B Dr Royce Macadamia at [facility]. Will follow.  2. DM2 3. PAD sp L toe amps early Jun 2020 4. Hypotension - not sure septic vs GIB vs other 5. Volume - no vol excess on exam 6. DOE - has infiltrates on CXR and hypothermia, presumed PNA. Started on IV abx vanc/ meropenem.  7. HL    Kelly Splinter  MD 05/12/2019, 9:14 PM

## 2019-05-12 NOTE — H&P (Signed)
History and Physical    Louis Bradley HQP:[id number] DOB: [birth year] DOA: 05/12/2019  Referring MD/NP/PA: Lajean Saver, MD PCP: Colon Branch, MD  Patient coming from: Home via EMS  Chief Complaint: Weakness and shortness of breath  I have personally briefly reviewed patient's old medical records in [place]   HPI: Louis Bradley is a 73 y.o. male with medical history significant of HTN, CKD stage IV followed Dr. Royce Macadamia of nephrology, OSA on CPAP; who presents with complaints of generalized weakness and shortness of breath.  Patient just recently hospitalized for GI bleed and NSTEMI from 6/25-6/26.  It appears he was transfused a total 3 units of packed red blood cells and recommended to undergo an EGD by Dr. Cristina Gong, but patient declined.  Dr. Marlou Porch of cardiology also evaluated the patient due to elevated sensitive troponin with T wave inversions in all leads.  He declined cardiac cath due to possibility of worsening his kidney function.  Since leaving the hospital he has not been able to do anything due to complaints of weakness and shortness of breath.  He has had a productive cough. Reports stool still dark in color.  Denies any loss of consciousness, falls, or chest pain.   He is willing to undergo a EGD during this hospitalization, but still declines cardiac cath.  Wife is patient's medical power of attorney and she states she is the one who should make the decisions for the patient. She reports the patient is incapable of making any decisions in his current state anwants the nephrologist on board with his care and I will [place].  ED Course: Upon admission into the emergency department patient was noted to have a temperature of 95.9 F, pulse 51-77, respirations 18-24, blood pressure is low as 88/49 with improved after 500 mL of normal saline IV fluid to 116/58, and O2 saturations maintained on 6 L of nasal cannula oxygen.  Labs revealed WBC 12.5, hemoglobin 6.9, sodium 132, BUN 134,  creatinine 5.6, BNP 2283, lactic acid 0.8.  Urinalysis was positive for signs of infection.  Chest x-ray showed multifocal airspace opacities concerning for pneumonia.  Stool guaiacs were noted to be positive. Patient was started on empiric antibiotics of vancomycin and cefepime.  Review of Systems  Unable to perform ROS: Medical condition  Constitutional: Positive for malaise/fatigue.  HENT: Negative for congestion and ear discharge.   Eyes: Negative for photophobia and discharge.  Respiratory: Positive for cough and shortness of breath.   Cardiovascular: Negative for chest pain and leg swelling.  Gastrointestinal: Positive for blood in stool.  Genitourinary: Negative for dysuria and hematuria.  Musculoskeletal: Negative for falls.  Skin: Negative for itching and rash.  Neurological: Positive for weakness. Negative for loss of consciousness.    Past Medical History:  Diagnosis Date  . Anemia   . Anesthesia complication    General anesthesia makes me feel foggy for long periods of time  . Arthritis   . CKD (chronic kidney disease)    Stage 4-5  . Complication of anesthesia    slow to come out of anesthesia - a lot of confusion   . Diabetes ([place])   . Fournier gangrene   . Heart murmur    Born with, no problems  . History of blood transfusion    x2  . Hyperlipidemia   . Hypertension   . OSA on CPAP   . Osteomyelitis ([place]) 2016   L toe  . Renal atrophy, left   . Vitamin  D deficiency     Past Surgical History:  Procedure Laterality Date  . AMPUTATION TOE Left 04/20/2019   Procedure: AMPUTATION LEFT SECOND AND THIRD TOES;  Surgeon: Newt Minion, MD;  Location: [facility];  Service: Orthopedics;  Laterality: Left;  . COLONOSCOPY    . COLOSTOMY  03/2015  . COLOSTOMY REVERSAL    . TOE AMPUTATION Left 2016   Left big toe amputated due to MRSA infection     reports that he has quit smoking. He has never used smokeless tobacco. He reports current alcohol use. He reports that he  does not use drugs.  No Known Allergies  Family History  Problem Relation Age of Onset  . CAD Father 57  . CAD Other        2 uncles had heart attacks  . Colon cancer Neg Hx   . Prostate cancer Neg Hx     Prior to Admission medications   Medication Sig Start Date End Date Taking? Authorizing Provider  amLODipine (NORVASC) 10 MG tablet Take 1 tablet (10 mg total) by mouth daily. 03/13/19  Yes Colon Branch, MD  calcitRIOL (ROCALTROL) 0.25 MCG capsule Take 0.25 mcg by mouth every Monday, Wednesday, and Friday.  03/22/19  Yes [provider]  carvedilol (COREG) 25 MG tablet Take 1 tablet (25 mg total) by mouth 2 (two) times daily with a meal. Patient taking differently: Take 25 mg by mouth 2 (two) times a day.  03/13/19  Yes Paz, Alda Berthold, MD  furosemide (LASIX) 40 MG tablet Take 2 tablets (80 mg total) by mouth daily. 03/13/19  Yes Paz, Alda Berthold, MD  glipiZIDE (GLUCOTROL) 5 MG tablet Take 2 tablets (10 mg total) by mouth 2 (two) times daily. 04/20/19  Yes Paz, Alda Berthold, MD  nicotine polacrilex (COMMIT) 4 MG lozenge Take 4 mg by mouth as needed for smoking cessation.   Yes [provider]  pantoprazole (PROTONIX) 40 MG tablet Take 1 tablet (40 mg total) by mouth 2 (two) times daily. 05/11/19  Yes Allie Bossier, MD  pravastatin (PRAVACHOL) 40 MG tablet Take 1 tablet (40 mg total) by mouth at bedtime. 03/13/19  Yes Colon Branch, MD  Probiotic Product (PROBIOTIC DAILY PO) Take 1 tablet by mouth every Monday, Wednesday, and Friday.    Yes [provider]  sodium bicarbonate 650 MG tablet Take 650 mg by mouth daily. 03/20/19  Yes [provider]  sodium zirconium cyclosilicate (LOKELMA) 10 g PACK packet Take 10 g by mouth daily. Use as directed by nephrology Patient taking differently: Take 10 g by mouth every Monday, Wednesday, and Friday. Use as directed by nephrology 04/16/19  Yes Copland, Gay Filler, MD    Physical Exam:  Constitutional: Elderly male who appears acutely ill  Vitals:   05/12/19 1602 05/12/19 1615 05/12/19 1630 05/12/19 1645  BP:  103/65 105/61 100/67  Pulse:      Resp:  18 (!) 21 (!) 22  Temp:      TempSrc:      SpO2:      Weight: 134.7 kg     Height: 6\' 3"  (1.905 m)      Eyes: PERRL, lids and conjunctivae normal ENMT: Mucous membranes are dry. Posterior pharynx clear of any exudate or lesions.  Neck: normal, supple, no masses, no thyromegaly Respiratory: Mildly tachypneic on 6 L of high flow nasal cannula oxygen  Cardiovascular: Regular rate and rhythm, no murmurs / rubs / gallops.  Trace lower extremity edema.  2+ pedal pulses. No carotid bruits.  Abdomen: no tenderness, no masses palpated. No hepatosplenomegaly. Bowel sounds positive.  Musculoskeletal: no clubbing / cyanosis. No joint deformity upper and lower extremities. Good ROM, no contractures. Normal muscle tone.  Skin: Pallor present. Neurologic: CN 2-12 grossly intact. Sensation intact, DTR normal. Strength 5/5 in all 4.  Psychiatric:   Lethargic and oriented x 3. Normal Bradley.     Labs on Admission: I have personally reviewed following labs and imaging studies  CBC: Recent Labs  Lab 05/10/19 1827 05/11/19 0935 05/12/19 1434  WBC 14.3* 13.7* 12.5*  NEUTROABS  --   --  11.3*  HGB 5.7* 6.8* 6.9*  HCT 18.5* 20.9* 21.7*  MCV 93.0 90.1 91.6  PLT 215 202 258   Basic Metabolic Panel: Recent Labs  Lab 05/10/19 1827 05/10/19 1924 05/11/19 0935 05/12/19 1434  NA 135  --  137 132*  K 5.2*  --  4.6 4.7  CL 105  --  106 101  CO2 22  --  20* 18*  GLUCOSE 176*  --  139* 182*  BUN 115*  --  119* 134*  CREATININE 5.24*  --  5.29* 5.60*  CALCIUM 8.1*  --  8.1* 7.8*  MG  --  3.0* 2.9*  --    GFR: Estimated Creatinine Clearance: 17.4 mL/min (A) (by C-G formula based on SCr of 5.6 mg/dL (H)). Liver Function Tests: Recent Labs  Lab 05/10/19 1924 05/12/19 1434  AST 18 13*  ALT 19 15  ALKPHOS 52 50  BILITOT 0.5 0.4  PROT 6.1* 5.8*  ALBUMIN 3.2* 3.0*   No results for  input(s): LIPASE, AMYLASE in the last 168 hours. No results for input(s): AMMONIA in the last 168 hours. Coagulation Profile: Recent Labs  Lab 05/10/19 1924  INR 1.1   Cardiac Enzymes: No results for input(s): CKTOTAL, CKMB, CKMBINDEX, TROPONINI in the last 168 hours. BNP (last 3 results) No results for input(s): PROBNP in the last 8760 hours. HbA1C: Recent Labs    05/11/19 0935  HGBA1C 5.9*   CBG: Recent Labs  Lab 05/10/19 1827 05/10/19 2325 05/11/19 0252 05/11/19 0719 05/11/19 1107  GLUCAP 165* 124* 119* 121* 139*   Lipid Profile: Recent Labs    05/11/19 0935  CHOL 83  HDL 24*  LDLCALC 32  TRIG 136  CHOLHDL 3.5   Thyroid Function Tests: No results for input(s): TSH, T4TOTAL, FREET4, T3FREE, THYROIDAB in the last 72 hours. Anemia Panel: No results for input(s): VITAMINB12, FOLATE, FERRITIN, TIBC, IRON, RETICCTPCT in the last 72 hours. Urine analysis:    Component Value Date/Time   COLORURINE YELLOW 05/12/2019 1600   APPEARANCEUR HAZY (A) 05/12/2019 1600   LABSPEC 1.013 05/12/2019 1600   PHURINE 5.0 05/12/2019 1600   GLUCOSEU 50 (A) 05/12/2019 1600   HGBUR SMALL (A) 05/12/2019 1600   BILIRUBINUR NEGATIVE 05/12/2019 1600   KETONESUR NEGATIVE 05/12/2019 1600   PROTEINUR 100 (A) 05/12/2019 1600   NITRITE NEGATIVE 05/12/2019 1600   LEUKOCYTESUR LARGE (A) 05/12/2019 1600   Sepsis Labs: Recent Results (from the past 240 hour(s))  Novel Coronavirus,NAA,(SEND-OUT TO REF LAB - TAT 24-48 hrs); Hosp Order     Status: None   Collection Time: 05/10/19  7:39 PM   Specimen: Nasopharyngeal Swab; Respiratory  Result Value Ref Range Status   SARS-CoV-2, NAA NOT DETECTED NOT DETECTED Final    Comment: (NOTE) This test was developed and its performance characteristics determined by [employer]. This test has not been FDA cleared or approved.  This test has been authorized by FDA under an Emergency Use Authorization (EUA). This test is only authorized for the  duration of time the declaration that circumstances exist justifying the authorization of the emergency use of in vitro diagnostic tests for detection of SARS-CoV-2 virus and/or diagnosis of COVID-19 infection under section 564(b)(1) of the Act, 21 U.S.C. 101BPZ-0(C)(5), unless the authorization is terminated or revoked sooner. When diagnostic testing is negative, the possibility of a false negative result should be considered in the context of a patient's recent exposures and the presence of clinical signs and symptoms consistent with COVID-19. An individual without symptoms of COVID-19 and who is not shedding SARS-CoV-2 virus would expect to have a negative (not detected) result in this assay. Performed  At: [facility] [address] [place], [place] [id number] Rush Farmer MD PN:[id number]    [place]  Final    Comment: Performed at [facility], [place] [address], [place], [place] [address]  SARS Coronavirus 2 (CEPHEID- Performed in [place] hospital lab), Hosp Order     Status: None   Collection Time: 05/12/19  2:34 PM   Specimen: Nasopharyngeal Swab  Result Value Ref Range Status   SARS Coronavirus 2 NEGATIVE NEGATIVE Final    Comment: (NOTE) If result is NEGATIVE SARS-CoV-2 target nucleic acids are NOT DETECTED. The SARS-CoV-2 RNA is generally detectable in upper and lower  respiratory specimens during the acute phase of infection. The lowest  concentration of SARS-CoV-2 viral copies this assay can detect is 250  copies / mL. A negative result does not preclude SARS-CoV-2 infection  and should not be used as the sole basis for treatment or other  patient management decisions.  A negative result may occur with  improper specimen collection / handling, submission of specimen other  than nasopharyngeal swab, presence of viral mutation(s) within the  areas targeted by this assay, and inadequate number of viral copies  (<250 copies /  mL). A negative result must be combined with clinical  observations, patient history, and epidemiological information. If result is POSITIVE SARS-CoV-2 target nucleic acids are DETECTED. The SARS-CoV-2 RNA is generally detectable in upper and lower  respiratory specimens dur ing the acute phase of infection.  Positive  results are indicative of active infection with SARS-CoV-2.  Clinical  correlation with patient history and other diagnostic information is  necessary to determine patient infection status.  Positive results do  not rule out bacterial infection or co-infection with other viruses. If result is PRESUMPTIVE POSTIVE SARS-CoV-2 nucleic acids MAY BE PRESENT.   A presumptive positive result was obtained on the submitted specimen  and confirmed on repeat testing.  While 2019 novel coronavirus  (SARS-CoV-2) nucleic acids may be present in the submitted sample  additional confirmatory testing may be necessary for epidemiological  and / or clinical management purposes  to differentiate between  SARS-CoV-2 and other Sarbecovirus currently known to infect humans.  If clinically indicated additional testing with an alternate test  methodology [id number]) is advised. The SARS-CoV-2 RNA is generally  detectable in upper and lower respiratory sp ecimens during the acute  phase of infection. The expected result is Negative. Fact Sheet for Patients:  StrictlyIdeas.no Fact Sheet for Healthcare Providers: BankingDealers.co.za This test is not yet approved or cleared by the [place] FDA and has been authorized for detection and/or diagnosis of SARS-CoV-2 by FDA under an Emergency Use Authorization (EUA).  This EUA will remain in effect (meaning this test can be used)  for the duration of the COVID-19 declaration under Section 564(b)(1) of the Act, 21 U.S.C. section 360bbb-3(b)(1), unless the authorization is terminated or revoked sooner.  Performed at [facility], [place] [address], [place], [place] [address]      Radiological Exams on Admission: Dg Chest Port 1 View  Result Date: 05/12/2019 CLINICAL DATA:  Cough EXAM: PORTABLE CHEST 1 VIEW COMPARISON:  May 10, 2019 FINDINGS: There is cardiomegaly with pulmonary vascularity within normal limits. There are small pleural effusions bilaterally. There is consolidation in each lung base. Lungs elsewhere clear. No adenopathy. No bone lesions. IMPRESSION: Bibasilar airspace consolidation, most likely representing multifocal pneumonia. Small pleural effusions bilaterally. No evident interstitial edema. There is cardiomegaly. Electronically Signed   By: Lowella Grip III M.D.   On: 05/12/2019 16:12   Dg Chest Portable 1 View  Result Date: 05/10/2019 CLINICAL DATA:  Dizziness. EXAM: PORTABLE CHEST 1 VIEW COMPARISON:  None. FINDINGS: 1919 hours. Low lung volumes. Interstitial markings are diffusely coarsened with chronic features. Bibasilar patchy airspace disease suggests atelectasis or infiltrate. No substantial pleural effusion. Cardiopericardial silhouette is at upper limits of normal for size. Bones are diffusely demineralized. Telemetry leads overlie the chest. IMPRESSION: Low lung volumes with bibasilar atelectasis or infiltrate. Electronically Signed   By: Misty Stanley M.D.   On: 05/10/2019 20:09    EKG: Independently reviewed.  Sinus rhythm at 87 bpm with QTc 523 wave inversions in all leads  Assessment/Plan Sepsis secondary to pneumonia and urinary tract infection: Patient found to be hypothermic with hypotension and WBC 12.3.  Lactic acid was reassuring at 0.8.  Discharged from the hospital complaining of cough and generalized malaise.  COVID-19 negative x2. Found to have multifocal pneumonia on chest x-ray and urinalysis positive for signs of infection. -Admit to a progressive bed -Follow-up sputum, urine, and blood culture -Add-on procalcitonin -Continue vancomycin  and cefepime -Mucinex  Acute respiratory failure: Likely secondary to pneumonia, but patient also noted to have decrease EF. -Continuous pulse oximetry with nasal cannula oxygen as needed -Check BNP  GI bleed, acute blood loss anemia: Patient presents with complaints of continued fatigue and weakness patient found to have hemoglobin of 6.9 with guaiac positive stools.  Hemoglobin was 6.8 yesterday, but was given 1 unit of packed red blood cells prior to being discharged home.  Patient was typed and screened and ordered 1 unit of packed red blood cells. -Clear liquid diet, n.p.o. after midnight -Continue with transfusion of 1 pack of RBCs -Continue to monitor and transfuse blood products as needed -Dr. Oletta Lamas of [facility] consulted, will follow for further recommendations   Hypotension: Acute.  Blood pressures initially as low as 88/49 but improved with IV fluids of 500 mL. -Hold antihypertensives Coreg and amlodipine -IV fluids as needed.  NSTEMI: Troponin now 2283.  Patient declined will to undergo cardiac catheterization during last hospitalization due to dye likely worsening kidney function.  EF was noted to be 50% on echocardiogram performed yesterday.  Patient still declines evaluation with cardiac cath. -Will need to reconsult cardiology in a.m.  Acute kidney injury superimposed on chronic kidney disease stage IV, Uremia: Patient presents with creatinine progressively worsening from 5.29 to 5.6 with BUN 135. Suspect symptoms likely associated with hypovolemia.  Patient follows with Dr. Royce Macadamia nephrology in the outpatient setting.   -Recheck renal function panel in a.m. -Dr. Jonnie Finner of nephrology consulted  Prolonged QT interval: Acute.  QTc 523 on admission. -Recheck EKG in a.m. -Limiting QT prolonging medications  Diabetes  mellitus type 2: Diabetes well controlled currently on glipizide.  Hemoglobin A1c 5.9 on 6/26. -Hypoglycemic protocol -Hold glipizide  -CBGs q. before meals  with sensitive SSI  Hyperlipidemia -Continue pravastatin  OSA on CPAP -CPAP per RT  DVT prophylaxis: SCDs Code Status: Full Family Communication: Discussed plan of care with the patient over the phone Disposition Plan: To be determined Consults called: GI, nephrology Admission status: Inpatient   Norval Morton MD [facility] Pager [phone]   If 7PM-7AM, please contact night-coverage [email or web address] Password [facility]  05/12/2019, 5:09 PM

## 2019-05-12 NOTE — ED Notes (Signed)
Family contacted and given status update.

## 2019-05-12 NOTE — Progress Notes (Signed)
Patient refused use of CPAP for evening. Will continue to monitor patient.

## 2019-05-12 NOTE — ED Notes (Signed)
Please call pt's wife Deren Degrazia at [phone] or daughter Jessy Oto at [phone] to update them on pt's

## 2019-05-12 NOTE — ED Notes (Signed)
This RN and [facility] ED EMT assisted pt to restroom via [place] and oxygen tank to have a BM.  Bedside commode offered and pt declined at this time.  Upon pt sitting up on side of strecher symptpms evaluated and pt denied any dizziness, SOB or increase in weakness. Yellow socks were placed on pt prior to transferring to [facility] Pt stood to pivot with one assist as he is 24ft 3in and the seat was low for him.  Pt wheeled to restroom and pivoted to toilet.  Pt stood with his hands on his waistband and this RN stated "I can help you sit, its a low toilet seat for such a tall person!" Pt denied help and this RN reminded pt to pull call light before standing.  This RN and EMT left the restroom at that time.  Call light for bathroom rang and pt was found on the floor at 1512.  ED techs and RN's assisted him back to toilet to proceed with BM.  Pt denied pain at that time and neuro assessment intact per RN.    Upon returning to room after having BM and obtaining urine specimen pt complains of Right neck and shoulder pain.  Denies hitting his head.  Pt also has a new abrasion noted to right knee.  Pt stated that he was not dizzy and did not vagal, he states "I missed the toilet."  Pt re-assessment completed at this time per [facility] RN as she has now taken over the pt's care.

## 2019-05-12 NOTE — ED Notes (Signed)
Ander Purpura (wife)  [phone]

## 2019-05-13 ENCOUNTER — Inpatient Hospital Stay (HOSPITAL_COMMUNITY): Payer: Medicare Other | Admitting: Certified Registered Nurse Anesthetist

## 2019-05-13 ENCOUNTER — Encounter (HOSPITAL_COMMUNITY)
Admission: EM | Disposition: A | Discharge status: Home Health | Payer: Self-pay | Source: Home / Self Care | Attending: Student

## 2019-05-13 ENCOUNTER — Other Ambulatory Visit: Payer: Self-pay

## 2019-05-13 ENCOUNTER — Inpatient Hospital Stay (HOSPITAL_COMMUNITY): Payer: Medicare Other

## 2019-05-13 ENCOUNTER — Encounter (HOSPITAL_COMMUNITY): Payer: Self-pay | Admitting: *Deleted

## 2019-05-13 DIAGNOSIS — D649 Anemia, unspecified: Secondary | ICD-10-CM

## 2019-05-13 DIAGNOSIS — I214 Non-ST elevation (NSTEMI) myocardial infarction: Secondary | ICD-10-CM

## 2019-05-13 DIAGNOSIS — I5043 Acute on chronic combined systolic (congestive) and diastolic (congestive) heart failure: Secondary | ICD-10-CM

## 2019-05-13 DIAGNOSIS — R8281 Pyuria: Secondary | ICD-10-CM

## 2019-05-13 DIAGNOSIS — Z9989 Dependence on other enabling machines and devices: Secondary | ICD-10-CM

## 2019-05-13 DIAGNOSIS — G4733 Obstructive sleep apnea (adult) (pediatric): Secondary | ICD-10-CM

## 2019-05-13 DIAGNOSIS — D72825 Bandemia: Secondary | ICD-10-CM

## 2019-05-13 HISTORY — PX: ESOPHAGOGASTRODUODENOSCOPY: SHX5428

## 2019-05-13 HISTORY — PX: BIOPSY: SHX5522

## 2019-05-13 LAB — CBC
HCT: 20.9 % — ABNORMAL LOW (ref 39.0–52.0)
Hemoglobin: 6.8 g/dL — CL (ref 13.0–17.0)
MCH: 29.4 pg (ref 26.0–34.0)
MCHC: 32.5 g/dL (ref 30.0–36.0)
MCV: 90.5 fL (ref 80.0–100.0)
Platelets: 170 10*3/uL (ref 150–400)
RBC: 2.31 MIL/uL — ABNORMAL LOW (ref 4.22–5.81)
RDW: 15 % (ref 11.5–15.5)
WBC: 10.9 10*3/uL — ABNORMAL HIGH (ref 4.0–10.5)
nRBC: 0.3 % — ABNORMAL HIGH (ref 0.0–0.2)

## 2019-05-13 LAB — HEMOGLOBIN AND HEMATOCRIT, BLOOD
HCT: 23.8 % — ABNORMAL LOW (ref 39.0–52.0)
Hemoglobin: 7.9 g/dL — ABNORMAL LOW (ref 13.0–17.0)

## 2019-05-13 LAB — RENAL FUNCTION PANEL
Albumin: 2.8 g/dL — ABNORMAL LOW (ref 3.5–5.0)
Anion gap: 11 (ref 5–15)
BUN: 139 mg/dL — ABNORMAL HIGH (ref 8–23)
CO2: 19 mmol/L — ABNORMAL LOW (ref 22–32)
Calcium: 7.7 mg/dL — ABNORMAL LOW (ref 8.9–10.3)
Chloride: 106 mmol/L (ref 98–111)
Creatinine, Ser: 5.64 mg/dL — ABNORMAL HIGH (ref 0.61–1.24)
GFR calc Af Amer: 11 mL/min — ABNORMAL LOW (ref >60)
GFR calc non Af Amer: 9 mL/min — ABNORMAL LOW (ref >60)
Glucose, Bld: 140 mg/dL — ABNORMAL HIGH (ref 70–99)
Phosphorus: 6 mg/dL — ABNORMAL HIGH (ref 2.5–4.6)
Potassium: 4.7 mmol/L (ref 3.5–5.1)
Sodium: 136 mmol/L (ref 135–145)

## 2019-05-13 LAB — IRON AND TIBC
Iron: 30 ug/dL — ABNORMAL LOW (ref 45–182)
Saturation Ratios: 9 % — ABNORMAL LOW (ref 17.9–39.5)
TIBC: 318 ug/dL (ref 250–450)
UIBC: 288 ug/dL

## 2019-05-13 LAB — PREPARE RBC (CROSSMATCH)

## 2019-05-13 LAB — VITAMIN B12: Vitamin B-12: 442 pg/mL (ref 180–914)

## 2019-05-13 LAB — RETICULOCYTES
Immature Retic Fract: 35.4 % — ABNORMAL HIGH (ref 2.3–15.9)
RBC.: 2.64 MIL/uL — ABNORMAL LOW (ref 4.22–5.81)
Retic Count, Absolute: 127.2 10*3/uL (ref 19.0–186.0)
Retic Ct Pct: 4.8 % — ABNORMAL HIGH (ref 0.4–3.1)

## 2019-05-13 LAB — MAGNESIUM: Magnesium: 3.2 mg/dL — ABNORMAL HIGH (ref 1.7–2.4)

## 2019-05-13 LAB — GLUCOSE, CAPILLARY
Glucose-Capillary: 112 mg/dL — ABNORMAL HIGH (ref 70–99)
Glucose-Capillary: 131 mg/dL — ABNORMAL HIGH (ref 70–99)
Glucose-Capillary: 129 mg/dL — ABNORMAL HIGH (ref 70–99)
Glucose-Capillary: 108 mg/dL — ABNORMAL HIGH (ref 70–99)

## 2019-05-13 LAB — FOLATE: Folate: 29.7 ng/mL (ref >5.9)

## 2019-05-13 LAB — FERRITIN: Ferritin: 205 ng/mL (ref 24–336)

## 2019-05-13 LAB — PROCALCITONIN: Procalcitonin: 0.2 ng/mL

## 2019-05-13 LAB — STREP PNEUMONIAE URINARY ANTIGEN: Strep Pneumo Urinary Antigen: NEGATIVE

## 2019-05-13 SURGERY — EGD (ESOPHAGOGASTRODUODENOSCOPY)
Anesthesia: Monitor Anesthesia Care

## 2019-05-13 MED ORDER — SODIUM CHLORIDE 0.9 % IV SOLN
100.0000 mg | Freq: Two times a day (BID) | INTRAVENOUS | Status: DC
  Administered 2019-05-13 – 2019-05-17 (×8): 100 mg via INTRAVENOUS
  Filled 2019-05-13 (×10): qty 100

## 2019-05-13 MED ORDER — PROPOFOL 10 MG/ML IV BOLUS: INTRAVENOUS | Status: DC | PRN

## 2019-05-13 MED ORDER — PROPOFOL 10 MG/ML IV BOLUS
INTRAVENOUS | Status: DC | PRN
  Administered 2019-05-13: 20 mg via INTRAVENOUS

## 2019-05-13 MED ORDER — FUROSEMIDE 10 MG/ML IJ SOLN
80.0000 mg | Freq: Once | INTRAMUSCULAR | Status: AC
  Administered 2019-05-13: 21:00:00 80 mg via INTRAVENOUS
  Filled 2019-05-13: qty 8

## 2019-05-13 MED ORDER — SODIUM CHLORIDE 0.9 % IV SOLN
INTRAVENOUS | Status: DC | PRN
  Administered 2019-05-13: 15:00:00 via INTRAVENOUS

## 2019-05-13 MED ORDER — PHENYLEPHRINE 40 MCG/ML (10ML) SYRINGE FOR IV PUSH (FOR BLOOD PRESSURE SUPPORT)
PREFILLED_SYRINGE | INTRAVENOUS | Status: DC | PRN
  Administered 2019-05-13: 80 ug via INTRAVENOUS

## 2019-05-13 MED ORDER — SODIUM CHLORIDE 0.9 % IV SOLN
INTRAVENOUS | Status: DC
  Administered 2019-05-13: 11:00:00 via INTRAVENOUS

## 2019-05-13 MED ORDER — FUROSEMIDE 10 MG/ML IJ SOLN
80.0000 mg | Freq: Once | INTRAMUSCULAR | Status: AC
  Administered 2019-05-13: 80 mg via INTRAVENOUS
  Filled 2019-05-13: qty 8

## 2019-05-13 MED ORDER — SODIUM CHLORIDE 0.9% IV SOLUTION: Freq: Once | INTRAVENOUS | Status: AC

## 2019-05-13 MED ORDER — PANTOPRAZOLE SODIUM 40 MG IV SOLR
40.0000 mg | Freq: Two times a day (BID) | INTRAVENOUS | Status: DC
  Administered 2019-05-13 – 2019-05-17 (×9): 40 mg via INTRAVENOUS
  Filled 2019-05-13 (×9): qty 40

## 2019-05-13 MED ORDER — PROPOFOL 500 MG/50ML IV EMUL
INTRAVENOUS | Status: DC | PRN
  Administered 2019-05-13: 50 ug/kg/min via INTRAVENOUS
  Administered 2019-05-13: 100 ug/kg/min via INTRAVENOUS

## 2019-05-13 NOTE — Progress Notes (Signed)
I d/c'd the novel coronavirus testing seeing as patient has already had this lab and resulted negative. This is part of standing orders preop for Endo.

## 2019-05-13 NOTE — Consult Note (Signed)
[facility] CONSULT Reason for consult: Anemia and positive stool Referring Physician: [facility] hospitalist.  Dr. Larose Kells.  Primary GI: Dr. Matthias Hughs is an 73 y.o. male.  HPI: 73 year old male who has seen Dr Cristina Gong before.  He has recently moved here from [place].  He apparently had EGD and colonoscopy within the past 6 or 8 months in [place] with the finding of a duodenal ulcer.  This is by his history the colonoscopy showed a few polyps.  We have no records of this.  He was admitted to the hospital last week with dark stool increased BUN and hemoglobin had dropped from 11-5.7 he received transfusion and was due to have EGD by Dr. Cristina Gong but decided to leave AMA.  He had some cardiac symptoms and it was recommended that he have a cardiac cath but he refused that as well.  He went home felt bad came back into the hospital.  Stools were positive on his previous admission.His hemoglobin was back to 6.9.  BUN and creatinine were elevated as well and he has been seen by nephrology.  Patient at this point is willing to undergo EGD.  He has not been on any acid reducing medication nor has he taking any more NSAIDs.  He is on aspirin once a day.  Past Medical History:  Diagnosis Date  . Anemia   . Anesthesia complication    General anesthesia makes me feel foggy for long periods of time  . Arthritis   . CKD (chronic kidney disease)    Stage 4-5  . Complication of anesthesia    slow to come out of anesthesia - a lot of confusion   . Diabetes ([place])   . Fournier gangrene   . Heart murmur    Born with, no problems  . History of blood transfusion    x2  . Hyperlipidemia   . Hypertension   . OSA on CPAP   . Osteomyelitis ([place]) 2016   L toe  . Renal atrophy, left   . Vitamin D deficiency     Past Surgical History:  Procedure Laterality Date  . AMPUTATION TOE Left 04/20/2019   Procedure: AMPUTATION LEFT SECOND AND THIRD TOES;  Surgeon: Newt Minion, MD;  Location: [facility];  Service:  Orthopedics;  Laterality: Left;  . COLONOSCOPY    . COLOSTOMY  03/2015  . COLOSTOMY REVERSAL    . TOE AMPUTATION Left 2016   Left big toe amputated due to MRSA infection    Family History  Problem Relation Age of Onset  . CAD Father 37  . CAD Other        2 uncles had heart attacks  . Colon cancer Neg Hx   . Prostate cancer Neg Hx     Social History:  reports that he has quit smoking. He has never used smokeless tobacco. He reports current alcohol use. He reports that he does not use drugs.  Allergies: No Known Allergies  Medications; Prior to Admission medications   Medication Sig Start Date End Date Taking? Authorizing Provider  amLODipine (NORVASC) 10 MG tablet Take 1 tablet (10 mg total) by mouth daily. 03/13/19  Yes Colon Branch, MD  calcitRIOL (ROCALTROL) 0.25 MCG capsule Take 0.25 mcg by mouth every Monday, Wednesday, and Friday.  03/22/19  Yes [provider]  carvedilol (COREG) 25 MG tablet Take 1 tablet (25 mg total) by mouth 2 (two) times daily with a meal. Patient taking differently: Take 25 mg by  mouth 2 (two) times a day.  03/13/19  Yes Paz, Alda Berthold, MD  furosemide (LASIX) 40 MG tablet Take 2 tablets (80 mg total) by mouth daily. 03/13/19  Yes Paz, Alda Berthold, MD  glipiZIDE (GLUCOTROL) 5 MG tablet Take 2 tablets (10 mg total) by mouth 2 (two) times daily. 04/20/19  Yes Paz, Alda Berthold, MD  nicotine polacrilex (COMMIT) 4 MG lozenge Take 4 mg by mouth as needed for smoking cessation.   Yes [provider]  pantoprazole (PROTONIX) 40 MG tablet Take 1 tablet (40 mg total) by mouth 2 (two) times daily. 05/11/19  Yes Allie Bossier, MD  pravastatin (PRAVACHOL) 40 MG tablet Take 1 tablet (40 mg total) by mouth at bedtime. 03/13/19  Yes Colon Branch, MD  Probiotic Product (PROBIOTIC DAILY PO) Take 1 tablet by mouth every Monday, Wednesday, and Friday.    Yes [provider]  sodium bicarbonate 650 MG tablet Take 650 mg by mouth daily. 03/20/19  Yes [provider]  sodium zirconium cyclosilicate (LOKELMA) 10 g PACK packet Take 10 g by mouth daily. Use as directed by nephrology Patient taking differently: Take 10 g by mouth every Monday, Wednesday, and Friday. Use as directed by nephrology 04/16/19  Yes Copland, Gay Filler, MD   . Derrill Memo ON 05/14/2019] calcitRIOL  0.25 mcg Oral Q M,W,F  . guaiFENesin  600 mg Oral BID  . insulin aspart  0-9 Units Subcutaneous TID WC  . pravastatin  40 mg Oral QHS  . sodium bicarbonate  650 mg Oral Daily  . sodium chloride flush  3 mL Intravenous Q12H  . [START ON 05/14/2019] sodium zirconium cyclosilicate  10 g Oral Once per day on Mon Wed Fri   PRN Meds acetaminophen **OR** acetaminophen, albuterol, nicotine polacrilex Results for orders placed or performed during the hospital encounter of 05/12/19 (from the past 48 hour(s))  Comprehensive metabolic panel     Status: Abnormal   Collection Time: 05/12/19  2:34 PM  Result Value Ref Range   Sodium 132 (L) 135 - 145 mmol/L   Potassium 4.7 3.5 - 5.1 mmol/L   Chloride 101 98 - 111 mmol/L   CO2 18 (L) 22 - 32 mmol/L   Glucose, Bld 182 (H) 70 - 99 mg/dL   BUN 134 (H) 8 - 23 mg/dL   Creatinine, Ser 5.60 (H) 0.61 - 1.24 mg/dL   Calcium 7.8 (L) 8.9 - 10.3 mg/dL   Total Protein 5.8 (L) 6.5 - 8.1 g/dL   Albumin 3.0 (L) 3.5 - 5.0 g/dL   AST 13 (L) 15 - 41 U/L   ALT 15 0 - 44 U/L   Alkaline Phosphatase 50 38 - 126 U/L   Total Bilirubin 0.4 0.3 - 1.2 mg/dL   GFR calc non Af Amer 9 (L) >60 mL/min   GFR calc Af Amer 11 (L) >60 mL/min   Anion gap 13 5 - 15    Comment: Performed at [facility], [address] [place], [place] [address]  CBC WITH DIFFERENTIAL     Status: Abnormal   Collection Time: 05/12/19  2:34 PM  Result Value Ref Range   WBC 12.5 (H) 4.0 - 10.5 K/uL   RBC 2.37 (L) 4.22 - 5.81 MIL/uL   Hemoglobin 6.9 (LL) 13.0 - 17.0 g/dL    Comment: REPEATED TO VERIFY THIS CRITICAL RESULT HAS VERIFIED AND BEEN CALLED TO K.CARTER RN BY KATHERINE MCCORMICK ON 06  27 2020 AT 4034, AND HAS BEEN READ  BACK.     HCT 21.7 (L) 39.0 - 52.0 %   MCV 91.6 80.0 - 100.0 fL   MCH 29.1 26.0 - 34.0 pg   MCHC 31.8 30.0 - 36.0 g/dL   RDW 15.0 11.5 - 15.5 %   Platelets 181 150 - 400 K/uL    Comment: REPEATED TO VERIFY   nRBC 0.2 0.0 - 0.2 %   Neutrophils Relative % 90 %   Neutro Abs 11.3 (H) 1.7 - 7.7 K/uL   Lymphocytes Relative 4 %   Lymphs Abs 0.6 (L) 0.7 - 4.0 K/uL   Monocytes Relative 4 %   Monocytes Absolute 0.5 0.1 - 1.0 K/uL   Eosinophils Relative 1 %   Eosinophils Absolute 0.1 0.0 - 0.5 K/uL   Basophils Relative 0 %   Basophils Absolute 0.0 0.0 - 0.1 K/uL   Immature Granulocytes 1 %   Abs Immature Granulocytes 0.11 (H) 0.00 - 0.07 K/uL    Comment: Performed at [facility], [address] [place], [place] [address]  Type and screen [place]     Status: None (Preliminary result)   Collection Time: 05/12/19  2:34 PM  Result Value Ref Range   ABO/RH(D) O POS    Antibody Screen NEG    Sample Expiration 05/15/2019,2359    Unit Number [id number]    Blood Component Type RED CELLS,LR    Unit division 00    Status of Unit ISSUED    Transfusion Status OK TO TRANSFUSE    Crossmatch Result      Compatible Performed at [facility], [place] [address], [place], [place] [address]   SARS Coronavirus 2 (CEPHEID- Performed in [facility] hospital lab), Hosp Order     Status: None   Collection Time: 05/12/19  2:34 PM   Specimen: Nasopharyngeal Swab  Result Value Ref Range   SARS Coronavirus 2 NEGATIVE NEGATIVE    Comment: (NOTE) If result is NEGATIVE SARS-CoV-2 target nucleic acids are NOT DETECTED. The SARS-CoV-2 RNA is generally detectable in upper and lower  respiratory specimens during the acute phase of infection. The lowest  concentration of SARS-CoV-2 viral copies this assay can detect is 250  copies / mL. A negative result does not preclude SARS-CoV-2 infection  and should not be used as the sole basis for  treatment or other  patient management decisions.  A negative result may occur with  improper specimen collection / handling, submission of specimen other  than nasopharyngeal swab, presence of viral mutation(s) within the  areas targeted by this assay, and inadequate number of viral copies  (<250 copies / mL). A negative result must be combined with clinical  observations, patient history, and epidemiological information. If result is POSITIVE SARS-CoV-2 target nucleic acids are DETECTED. The SARS-CoV-2 RNA is generally detectable in upper and lower  respiratory specimens dur ing the acute phase of infection.  Positive  results are indicative of active infection with SARS-CoV-2.  Clinical  correlation with patient history and other diagnostic information is  necessary to determine patient infection status.  Positive results do  not rule out bacterial infection or co-infection with other viruses. If result is PRESUMPTIVE POSTIVE SARS-CoV-2 nucleic acids MAY BE PRESENT.   A presumptive positive result was obtained on the submitted specimen  and confirmed on repeat testing.  While 2019 novel coronavirus  (SARS-CoV-2) nucleic acids may be present in the submitted sample  additional confirmatory testing may be necessary for epidemiological  and /  or clinical management purposes  to differentiate between  SARS-CoV-2 and other Sarbecovirus currently known to infect humans.  If clinically indicated additional testing with an alternate test  methodology [phone]) is advised. The SARS-CoV-2 RNA is generally  detectable in upper and lower respiratory sp ecimens during the acute  phase of infection. The expected result is Negative. Fact Sheet for Patients:  StrictlyIdeas.no Fact Sheet for Healthcare Providers: BankingDealers.co.za This test is not yet approved or cleared by the [place] FDA and has been authorized for detection and/or  diagnosis of SARS-CoV-2 by FDA under an Emergency Use Authorization (EUA).  This EUA will remain in effect (meaning this test can be used) for the duration of the COVID-19 declaration under Section 564(b)(1) of the Act, 21 U.S.C. section 360bbb-3(b)(1), unless the authorization is terminated or revoked sooner. Performed at [facility], [place] [address] [place], [place] [address]   Troponin I (High Sensitivity)     Status: Abnormal   Collection Time: 05/12/19  2:34 PM  Result Value Ref Range   Troponin I (High Sensitivity) 2,283 (HH) <18 ng/L    Comment: CRITICAL RESULT CALLED TO, READ BACK BY AND VERIFIED WITH: REABREB, A RN @ 1556 ON 05/12/2019 BY TEMOCHE, H Performed at [facility], [place] [address], [place], [place] [address]   Lactic acid, plasma     Status: None   Collection Time: 05/12/19  2:38 PM  Result Value Ref Range   Lactic Acid, Venous 0.8 0.5 - 1.9 mmol/L    Comment: Performed at [facility] [address], [place], [place] [address]  Blood Culture (routine x 2)     Status: None (Preliminary result)   Collection Time: 05/12/19  2:45 PM   Specimen: BLOOD  Result Value Ref Range   Specimen Description BLOOD RIGHT ANTECUBITAL    Special Requests      BOTTLES DRAWN AEROBIC AND ANAEROBIC Blood Culture results may not be optimal due to an excessive volume of blood received in culture bottles   Culture      NO GROWTH < 24 HOURS Performed at [facility] [address] [place], [place] [address]    Report Status PENDING   Prepare RBC     Status: None   Collection Time: 05/12/19  2:52 PM  Result Value Ref Range   Order Confirmation      ORDER PROCESSED BY BLOOD BANK Performed at [facility], [place] [address], [place], [place] [address]   Blood Culture (routine x 2)     Status: None (Preliminary result)   Collection Time: 05/12/19  3:09 PM   Specimen: BLOOD  Result Value Ref Range   Specimen Description BLOOD LEFT ANTECUBITAL     Special Requests      BOTTLES DRAWN AEROBIC AND ANAEROBIC Blood Culture adequate volume   Culture      NO GROWTH < 24 HOURS Performed at [facility], [place] [address] [place],  [address]    Report Status PENDING   POC occult blood, ED RN will collect     Status: Abnormal   Collection Time: 05/12/19  3:30 PM  Result Value Ref Range   Fecal Occult Bld POSITIVE (A) NEGATIVE  Urinalysis, Routine w reflex microscopic     Status: Abnormal   Collection Time: 05/12/19  4:00 PM  Result Value Ref Range   Color, Urine YELLOW YELLOW   APPearance HAZY (A) CLEAR   Specific Gravity, Urine 1.013  1.005 - 1.030   pH 5.0 5.0 - 8.0   Glucose, UA 50 (A) NEGATIVE mg/dL   Hgb urine dipstick SMALL (A) NEGATIVE   Bilirubin Urine NEGATIVE NEGATIVE   Ketones, ur NEGATIVE NEGATIVE mg/dL   Protein, ur 100 (A) NEGATIVE mg/dL   Nitrite NEGATIVE NEGATIVE   Leukocytes,Ua LARGE (A) NEGATIVE   RBC / HPF 6-10 0 - 5 RBC/hpf   WBC, UA >50 (H) 0 - 5 WBC/hpf   Bacteria, UA RARE (A) NONE SEEN   Squamous Epithelial / LPF 0-5 0 - 5   Mucus PRESENT     Comment: Performed at [facility], [place] [address] [place], [place] [address]  Lactic acid, plasma     Status: None   Collection Time: 05/12/19  6:32 PM  Result Value Ref Range   Lactic Acid, Venous 0.8 0.5 - 1.9 mmol/L    Comment: Performed at [facility] [address], [place], [place] [address]  Brain natriuretic peptide     Status: Abnormal   Collection Time: 05/12/19  6:32 PM  Result Value Ref Range   B Natriuretic Peptide 1,225.2 (H) 0.0 - 100.0 pg/mL    Comment: Performed at [facility] [address], [place], [place] [address]  Glucose, capillary     Status: Abnormal   Collection Time: 05/12/19  9:58 PM  Result Value Ref Range   Glucose-Capillary 114 (H) 70 - 99 mg/dL  Glucose, capillary     Status: Abnormal   Collection Time: 05/13/19  8:13 AM  Result Value Ref Range   Glucose-Capillary 112 (H) 70 - 99 mg/dL    Dg  Chest Port 1 View  Result Date: 05/12/2019 CLINICAL DATA:  Cough EXAM: PORTABLE CHEST 1 VIEW COMPARISON:  May 10, 2019 FINDINGS: There is cardiomegaly with pulmonary vascularity within normal limits. There are small pleural effusions bilaterally. There is consolidation in each lung base. Lungs elsewhere clear. No adenopathy. No bone lesions. IMPRESSION: Bibasilar airspace consolidation, most likely representing multifocal pneumonia. Small pleural effusions bilaterally. No evident interstitial edema. There is cardiomegaly. Electronically Signed   By: Lowella Grip III M.D.   On: 05/12/2019 16:12   ROS: Constitutional: Patient has had extreme fatigue. HEENT: Negative Cardiovascular: Denies chest pain Respiratory: Has been short of breath with exercise GI: No abdominal pain. GU: Has been urinating without difficulty Musculoskeletal: Negative  neuro/Psychiatric: Negative Endocrine/Heme: Dark tarry stools as noted above            Blood pressure (!) 107/58, pulse (!) 56, temperature 97.8 F (36.6 C), temperature source Oral, resp. rate (!) 26, height 6\' 3"  (1.905 m), weight 133.8 kg, SpO2 95 %.  Physical exam:   General--obese white male laying in bed ENT--nonicteric  Heart--regular rate and rhythm without murmurs or gallops Lungs--clear Abdomen--soft and nontender Psych--alert and oriented answers questions appropriately   Assessment: 1.  Anemia/heme positive stools.  Patient is now agreeable to EGD 2.  History of ulcers 3.  History of colon polyps 4.  UTI 5.  Recent respiratory failure decreased ejection fraction has refused any further cardiac work-up at this point 6.  Acute kidney injury superimposed on chronic kidney injury. 7.  Type 2 diabetes 8.  NSTEMI.  Patient has declined further cardiac evaluation at this point  Plan: Patient is now agreeable to moving ahead with EGD to try to determine if he is actually bleeding from an ulcer or if he needs further  work-up.  This would certainly be  important in trying to determine what type of cardiac medications to treat him with.  We will try to get this set up later today.  The only p.o. intake he has had today is his oral medications couple of hours ago with a sip of water.  He knows he needs to remain n.p.o.   Nancy Fetter 05/13/2019, 9:45 AM   This note was created using voice recognition software and minor errors may Have occurred unintentionally. Pager: [phone] If no answer or after hours call [phone]

## 2019-05-13 NOTE — Interval H&P Note (Signed)
History and Physical Interval Note:  05/13/2019 2:27 PM  Louis Bradley  has presented today for surgery, with the diagnosis of GI bleed.  The various methods of treatment have been discussed with the patient and family. After consideration of risks, benefits and other options for treatment, the patient has consented to  Procedure(s): ESOPHAGOGASTRODUODENOSCOPY (EGD) (N/A) as a surgical intervention.  The patient's history has been reviewed, patient examined, no change in status, stable for surgery.  I have reviewed the patient's chart and labs.  Questions were answered to the patient's satisfaction.     Nancy Fetter

## 2019-05-13 NOTE — Progress Notes (Signed)
I let Dr. Cyndia Skeeters, know that patients CBC and BMET results back

## 2019-05-13 NOTE — Anesthesia Preprocedure Evaluation (Signed)
Anesthesia Evaluation  Patient identified by MRN, date of birth, ID band Patient awake    Reviewed: Allergy & Precautions, NPO status , Patient's Chart, lab work & pertinent test results  History of Anesthesia Complications (+) history of anesthetic complications  Airway Mallampati: II  TM Distance: >3 FB Neck ROM: Full    Dental  (+) Dental Advisory Given   Pulmonary sleep apnea , pneumonia, former smoker,    Pulmonary exam normal breath sounds clear to auscultation       Cardiovascular hypertension, Pt. on home beta blockers and Pt. on medications Normal cardiovascular exam+ Valvular Problems/Murmurs AS  Rhythm:Regular Rate:Normal  Echo 05/11/2019  1. The left ventricle has a visually estimated ejection fraction of 50%. The cavity size was mildly dilated. Left ventricular diastolic Doppler parameters are consistent with pseudonormalization. Elevated left ventricular end-diastolic pressure.  Hypokinesis of the apical anterior wall, the apical septal wall, and the true apex.  2. The right ventricle has normal systolic function. The cavity was normal. There is no increase in right ventricular wall thickness.  3. Left atrial size was moderately dilated.  4. There is moderate mitral annular calcification present. No evidence of mitral valve stenosis. Mild mitral regurgitation.  5. The aortic valve is tricuspid. Moderate calcification of the aortic valve. Moderate stenosis of the aortic valve. Mean gradient 33 mmHg with AVA 1.29 cm^2.  6. The aortic root is normal in size and structure.  7. Right atrial size was mildly dilated.  8. Trivial pericardial effusion is present.  9. The inferior vena cava was dilated in size with <50% respiratory variability. PA systolic pressure 53 mmHg.   Neuro/Psych negative neurological ROS  negative psych ROS   GI/Hepatic negative GI ROS, Neg liver ROS,   Endo/Other  negative endocrine ROSdiabetes  Renal/GU Renal Insufficiency and CRFRenal disease     Musculoskeletal  (+) Arthritis , Osteoarthritis,    Abdominal   Peds  Hematology negative hematology ROS (+) anemia ,   Anesthesia Other Findings   Reproductive/Obstetrics                             Anesthesia Physical  Anesthesia Plan  ASA: IV  Anesthesia Plan: MAC   Post-op Pain Management:    Induction: Intravenous  PONV Risk Score and Plan: 1 and Propofol infusion, Treatment may vary due to age or medical condition and TIVA  Airway Management Planned:   Additional Equipment:   Intra-op Plan:   Post-operative Plan:   Informed Consent: I have reviewed the patients History and Physical, chart, labs and discussed the procedure including the risks, benefits and alternatives for the proposed anesthesia with the patient or authorized representative who has indicated his/her understanding and acceptance.     Dental advisory given  Plan Discussed with: CRNA  Anesthesia Plan Comments:         Anesthesia Quick Evaluation

## 2019-05-13 NOTE — Transfer of Care (Signed)
Immediate Anesthesia Transfer of Care Note  Patient: Louis Bradley  Procedure(s) Performed: ESOPHAGOGASTRODUODENOSCOPY (EGD) (N/A ) BIOPSY  Patient Location: Endoscopy Unit  Anesthesia Type:MAC  Level of Consciousness: awake, alert  and oriented  Airway & Oxygen Therapy: Patient Spontanous Breathing and Patient connected to nasal cannula oxygen  Post-op Assessment: Report given to RN and Post -op Vital signs reviewed and stable  Post vital signs: Reviewed and stable  Last Vitals:  Vitals Value Taken Time  BP 109/50 05/13/19 1536  Temp 36.3 C 05/13/19 1530  Pulse 54 05/13/19 1536  Resp 16 05/13/19 1536  SpO2 93 % 05/13/19 1536  Vitals shown include unvalidated device data.  Last Pain:  Vitals:   05/13/19 1530  TempSrc: Temporal  PainSc: 0-No pain         Complications: No apparent anesthesia complications

## 2019-05-13 NOTE — Op Note (Signed)
[facility] Patient Name: Louis Bradley Procedure Date : 05/13/2019 MRN: [id number] Attending MD: Nancy Fetter Dr., MD Date of Birth: 04-Oct-1946 CSN: [id number] Age: 73 Admit Type: Inpatient Procedure:                Upper GI endoscopy Indications:              Iron deficiency anemia secondary to chronic blood                            loss, Heme positive stool Providers:                Jeneen Rinks L. Shilpa Bushee Dr., MD, Carlyn Reichert, RN,                            William Dalton, Technician Referring MD:              Medicines:                Monitored Anesthesia Care Complications:            No immediate complications. Estimated Blood Loss:     Estimated blood loss: none. Procedure:                Pre-Anesthesia Assessment:                           - Prior to the procedure, a History and Physical                            was performed, and patient medications and                            allergies were reviewed. The patient's tolerance of                            previous anesthesia was also reviewed. The risks                            and benefits of the procedure and the sedation                            options and risks were discussed with the patient.                            All questions were answered, and informed consent                            was obtained. Prior Anticoagulants: The patient has                            taken no previous anticoagulant or antiplatelet                            agents. ASA Grade Assessment: IV - A patient with  severe systemic disease that is a constant threat                            to life. After reviewing the risks and benefits,                            the patient was deemed in satisfactory condition to                            undergo the procedure.                           After obtaining informed consent, the endoscope was                            passed under direct  vision. Throughout the                            procedure, the patient's blood pressure, pulse, and                            oxygen saturations were monitored continuously. The                            [id number]-H190 ([id number]) Olympus gastroscope was                            introduced through the mouth, and advanced to the                            second part of duodenum. The upper GI endoscopy was                            accomplished without difficulty. The patient                            tolerated the procedure well. Scope In: Scope Out: Findings:      The esophagus was normal.      Localized mild inflammation characterized by erythema was found in the       gastric antrum. Biopsies were taken with a cold forceps for histology.      Localized moderate inflammation characterized by congestion (edema),       erosions and erythema was found in the duodenal bulb. Biopsies were       taken with a cold forceps for histology. Impression:               - Normal esophagus.                           - Gastritis. Biopsied.                           - Duodenitis. Biopsied.                           - Blood  found in stool                           - Iron Deficiency Anemia Recommendation:           - Return patient to hospital ward for ongoing care.                           - Resume previous diet.                           - Continue present medications. Procedure Code(s):        --- Professional ---                           [id number], Esophagogastroduodenoscopy, flexible,                            transoral; with biopsy, single or multiple Diagnosis Code(s):        --- Professional ---                           R19.5, Other fecal abnormalities                           D50.0, Iron deficiency anemia secondary to blood                            loss (chronic)                           K29.80, Duodenitis without bleeding                           K29.70, Gastritis, unspecified, without  bleeding CPT copyright 2019 American Medical Association. All rights reserved. The codes documented in this report are preliminary and upon coder review may  be revised to meet current compliance requirements. Nancy Fetter Dr., MD 05/13/2019 3:37:26 PM This report has been signed electronically. Number of Addenda: 0

## 2019-05-13 NOTE — Progress Notes (Signed)
I text paged Dr. Cyndia Skeeters that mag level came back at 3.2

## 2019-05-13 NOTE — Progress Notes (Signed)
[facility] Progress Note  Subjective: no new c/o's, had EGD today, denies SOB. Lasix 80 given pre prbc and he voided about 1000 cc per RN.  SpO2's running borderline high80's per RN.   Vitals:   05/13/19 1534 05/13/19 1545 05/13/19 1600 05/13/19 1611  BP:  (!) 108/49 121/78 111/62  Pulse:  (!) 57 (!) 53 (!) 55  Resp:  20 (!) 26 (!) 22  Temp:      TempSrc:      SpO2: 92% 93% 92% 91%  Weight:      Height:        Inpatient medications: . [START ON 05/14/2019] calcitRIOL  0.25 mcg Oral Q M,W,F  . guaiFENesin  600 mg Oral BID  . insulin aspart  0-9 Units Subcutaneous TID WC  . pantoprazole (PROTONIX) IV  40 mg Intravenous Q12H  . pravastatin  40 mg Oral QHS  . sodium bicarbonate  650 mg Oral Daily  . sodium chloride flush  3 mL Intravenous Q12H  . [START ON 05/14/2019] sodium zirconium cyclosilicate  10 g Oral Once per day on Mon Wed Fri   . sodium chloride    . ceFEPime (MAXIPIME) IV 2 g (05/13/19 1700)  . doxycycline (VIBRAMYCIN) IV 100 mg (05/13/19 1758)   acetaminophen **OR** acetaminophen, albuterol, nicotine polacrilex    Exam: Gen alert obese WM no distress, lethargic but awakens and converses w/o difficulty Sclera anicteric, throat clear  No jvd  Chest clear bilat to bases, no rales/ wheezing RRR no MRG Abd soft ntnd no mass or ascites +bs  GU normal male MS no joint effusions or deformity Ext 1-2+ bilat hip / LE edema Neuro is alert, Ox 3 , nf    Home meds:  - amlodipine 10/ carvedilol 25 bid  - glipizide 10mg  bid  - pravastatin 40 hs/ pantoprazole 40 bid/ nicotine gum  - sodium zirconium cyclosilicate 34LP q mwf   CXR 6/27 > bilateral basilar infiltrates, no evident edema     Assessment/ Plan: 1. CKD stage IV/V - f/b Dr Royce Macadamia for CKD 4/5. Here creat worsening as compared to April 2020 when creat was 4-5 range, may be acute due to hypotension and hypoperfusion, vs progression. Question early uremia, and sig azotemia. Proposed starting  dialysis Monday, pt declined, wants to "give it some time" after fixing the anemia issue. He said he will think about it overnight as well. Have d/w pt's wife as well.  Will follow closely.  2. DM2 3. PAD sp L toe amps early Jun 2020 4. Hypotension - not sure septic vs GIB vs other 5. Volume - +some LE edema, will give one more IV lasix tonight 80mg  6. HCAP - infiltrates on CXR and hypothermia, presumed PNA. Started on IV abx vanc/ meropenem.    [facility] 05/13/2019, 6:36 PM  Iron/TIBC/Ferritin/ %Sat    Component Value Date/Time   IRON 30 (L) 05/13/2019 1707   IRON 74 03/19/2019   TIBC 318 05/13/2019 1707   TIBC 289 03/19/2019   FERRITIN 205 05/13/2019 1707   FERRITIN 224 03/19/2019   IRONPCTSAT 9 (L) 05/13/2019 1707   IRONPCTSAT 26 03/19/2019   Recent Labs  Lab 05/10/19 1924  05/13/19 1153  NA  --    < > 136  K  --    < > 4.7  CL  --    < > 106  CO2  --    < > 19*  GLUCOSE  --    < >  140*  BUN  --    < > 139*  CREATININE  --    < > 5.64*  CALCIUM  --    < > 7.7*  PHOS  --   --  6.0*  ALBUMIN 3.2*   < > 2.8*  INR 1.1  --   --    < > = values in this interval not displayed.   Recent Labs  Lab 05/12/19 1434  AST 13*  ALT 15  ALKPHOS 50  BILITOT 0.4  PROT 5.8*   Recent Labs  Lab 05/13/19 1153 05/13/19 1707  WBC 10.9*  --   HGB 6.8* 7.9*  HCT 20.9* 23.8*  PLT 170  --

## 2019-05-13 NOTE — H&P (View-Only) (Signed)
[facility] CONSULT Reason for consult: Anemia and positive stool Referring Physician: [facility] hospitalist.  Dr. Larose Kells.  Primary GI: Dr. Matthias Hughs is an 73 y.o. male.  HPI: 73 year old male who has seen Dr Cristina Gong before.  He has recently moved here from [place].  He apparently had EGD and colonoscopy within the past 6 or 8 months in [place] with the finding of a duodenal ulcer.  This is by his history the colonoscopy showed a few polyps.  We have no records of this.  He was admitted to the hospital last week with dark stool increased BUN and hemoglobin had dropped from 11-5.7 he received transfusion and was due to have EGD by Dr. Cristina Gong but decided to leave AMA.  He had some cardiac symptoms and it was recommended that he have a cardiac cath but he refused that as well.  He went home felt bad came back into the hospital.  Stools were positive on his previous admission.His hemoglobin was back to 6.9.  BUN and creatinine were elevated as well and he has been seen by nephrology.  Patient at this point is willing to undergo EGD.  He has not been on any acid reducing medication nor has he taking any more NSAIDs.  He is on aspirin once a day.  Past Medical History:  Diagnosis Date  . Anemia   . Anesthesia complication    General anesthesia makes me feel foggy for long periods of time  . Arthritis   . CKD (chronic kidney disease)    Stage 4-5  . Complication of anesthesia    slow to come out of anesthesia - a lot of confusion   . Diabetes ([place])   . Fournier gangrene   . Heart murmur    Born with, no problems  . History of blood transfusion    x2  . Hyperlipidemia   . Hypertension   . OSA on CPAP   . Osteomyelitis ([place]) 2016   L toe  . Renal atrophy, left   . Vitamin D deficiency     Past Surgical History:  Procedure Laterality Date  . AMPUTATION TOE Left 04/20/2019   Procedure: AMPUTATION LEFT SECOND AND THIRD TOES;  Surgeon: Newt Minion, MD;  Location: [place];  Service:  Orthopedics;  Laterality: Left;  . COLONOSCOPY    . COLOSTOMY  03/2015  . COLOSTOMY REVERSAL    . TOE AMPUTATION Left 2016   Left big toe amputated due to MRSA infection    Family History  Problem Relation Age of Onset  . CAD Father 56  . CAD Other        2 uncles had heart attacks  . Colon cancer Neg Hx   . Prostate cancer Neg Hx     Social History:  reports that he has quit smoking. He has never used smokeless tobacco. He reports current alcohol use. He reports that he does not use drugs.  Allergies: No Known Allergies  Medications; Prior to Admission medications   Medication Sig Start Date End Date Taking? Authorizing Provider  amLODipine (NORVASC) 10 MG tablet Take 1 tablet (10 mg total) by mouth daily. 03/13/19  Yes Colon Branch, MD  calcitRIOL (ROCALTROL) 0.25 MCG capsule Take 0.25 mcg by mouth every Monday, Wednesday, and Friday.  03/22/19  Yes [provider]  carvedilol (COREG) 25 MG tablet Take 1 tablet (25 mg total) by mouth 2 (two) times daily with a meal. Patient taking differently: Take 25 mg by  mouth 2 (two) times a day.  03/13/19  Yes Paz, Alda Berthold, MD  furosemide (LASIX) 40 MG tablet Take 2 tablets (80 mg total) by mouth daily. 03/13/19  Yes Paz, Alda Berthold, MD  glipiZIDE (GLUCOTROL) 5 MG tablet Take 2 tablets (10 mg total) by mouth 2 (two) times daily. 04/20/19  Yes Paz, Alda Berthold, MD  nicotine polacrilex (COMMIT) 4 MG lozenge Take 4 mg by mouth as needed for smoking cessation.   Yes [provider]  pantoprazole (PROTONIX) 40 MG tablet Take 1 tablet (40 mg total) by mouth 2 (two) times daily. 05/11/19  Yes Allie Bossier, MD  pravastatin (PRAVACHOL) 40 MG tablet Take 1 tablet (40 mg total) by mouth at bedtime. 03/13/19  Yes Colon Branch, MD  Probiotic Product (PROBIOTIC DAILY PO) Take 1 tablet by mouth every Monday, Wednesday, and Friday.    Yes [provider]  sodium bicarbonate 650 MG tablet Take 650 mg by mouth daily. 03/20/19  Yes [provider]  sodium zirconium cyclosilicate (LOKELMA) 10 g PACK packet Take 10 g by mouth daily. Use as directed by nephrology Patient taking differently: Take 10 g by mouth every Monday, Wednesday, and Friday. Use as directed by nephrology 04/16/19  Yes Copland, Gay Filler, MD   . Derrill Memo ON 05/14/2019] calcitRIOL  0.25 mcg Oral Q M,W,F  . guaiFENesin  600 mg Oral BID  . insulin aspart  0-9 Units Subcutaneous TID WC  . pravastatin  40 mg Oral QHS  . sodium bicarbonate  650 mg Oral Daily  . sodium chloride flush  3 mL Intravenous Q12H  . [START ON 05/14/2019] sodium zirconium cyclosilicate  10 g Oral Once per day on Mon Wed Fri   PRN Meds acetaminophen **OR** acetaminophen, albuterol, nicotine polacrilex Results for orders placed or performed during the hospital encounter of 05/12/19 (from the past 48 hour(s))  Comprehensive metabolic panel     Status: Abnormal   Collection Time: 05/12/19  2:34 PM  Result Value Ref Range   Sodium 132 (L) 135 - 145 mmol/L   Potassium 4.7 3.5 - 5.1 mmol/L   Chloride 101 98 - 111 mmol/L   CO2 18 (L) 22 - 32 mmol/L   Glucose, Bld 182 (H) 70 - 99 mg/dL   BUN 134 (H) 8 - 23 mg/dL   Creatinine, Ser 5.60 (H) 0.61 - 1.24 mg/dL   Calcium 7.8 (L) 8.9 - 10.3 mg/dL   Total Protein 5.8 (L) 6.5 - 8.1 g/dL   Albumin 3.0 (L) 3.5 - 5.0 g/dL   AST 13 (L) 15 - 41 U/L   ALT 15 0 - 44 U/L   Alkaline Phosphatase 50 38 - 126 U/L   Total Bilirubin 0.4 0.3 - 1.2 mg/dL   GFR calc non Af Amer 9 (L) >60 mL/min   GFR calc Af Amer 11 (L) >60 mL/min   Anion gap 13 5 - 15    Comment: Performed at [facility], [address] [place], [place] [address]  CBC WITH DIFFERENTIAL     Status: Abnormal   Collection Time: 05/12/19  2:34 PM  Result Value Ref Range   WBC 12.5 (H) 4.0 - 10.5 K/uL   RBC 2.37 (L) 4.22 - 5.81 MIL/uL   Hemoglobin 6.9 (LL) 13.0 - 17.0 g/dL    Comment: REPEATED TO VERIFY THIS CRITICAL RESULT HAS VERIFIED AND BEEN CALLED TO K.CARTER RN BY KATHERINE MCCORMICK ON 06  27 2020 AT 1914, AND HAS BEEN READ  BACK.     HCT 21.7 (L) 39.0 - 52.0 %   MCV 91.6 80.0 - 100.0 fL   MCH 29.1 26.0 - 34.0 pg   MCHC 31.8 30.0 - 36.0 g/dL   RDW 15.0 11.5 - 15.5 %   Platelets 181 150 - 400 K/uL    Comment: REPEATED TO VERIFY   nRBC 0.2 0.0 - 0.2 %   Neutrophils Relative % 90 %   Neutro Abs 11.3 (H) 1.7 - 7.7 K/uL   Lymphocytes Relative 4 %   Lymphs Abs 0.6 (L) 0.7 - 4.0 K/uL   Monocytes Relative 4 %   Monocytes Absolute 0.5 0.1 - 1.0 K/uL   Eosinophils Relative 1 %   Eosinophils Absolute 0.1 0.0 - 0.5 K/uL   Basophils Relative 0 %   Basophils Absolute 0.0 0.0 - 0.1 K/uL   Immature Granulocytes 1 %   Abs Immature Granulocytes 0.11 (H) 0.00 - 0.07 K/uL    Comment: Performed at [facility], [address], [place], [place] [address]  Type and screen [place]     Status: None (Preliminary result)   Collection Time: 05/12/19  2:34 PM  Result Value Ref Range   ABO/RH(D) O POS    Antibody Screen NEG    Sample Expiration 05/15/2019,2359    Unit Number [id number]    Blood Component Type RED CELLS,LR    Unit division 00    Status of Unit ISSUED    Transfusion Status OK TO TRANSFUSE    Crossmatch Result      Compatible Performed at [facility], [place] [address], [place], [place] [address]   SARS Coronavirus 2 (CEPHEID- Performed in [place] hospital lab), Hosp Order     Status: None   Collection Time: 05/12/19  2:34 PM   Specimen: Nasopharyngeal Swab  Result Value Ref Range   SARS Coronavirus 2 NEGATIVE NEGATIVE    Comment: (NOTE) If result is NEGATIVE SARS-CoV-2 target nucleic acids are NOT DETECTED. The SARS-CoV-2 RNA is generally detectable in upper and lower  respiratory specimens during the acute phase of infection. The lowest  concentration of SARS-CoV-2 viral copies this assay can detect is 250  copies / mL. A negative result does not preclude SARS-CoV-2 infection  and should not be used as the sole basis for  treatment or other  patient management decisions.  A negative result may occur with  improper specimen collection / handling, submission of specimen other  than nasopharyngeal swab, presence of viral mutation(s) within the  areas targeted by this assay, and inadequate number of viral copies  (<250 copies / mL). A negative result must be combined with clinical  observations, patient history, and epidemiological information. If result is POSITIVE SARS-CoV-2 target nucleic acids are DETECTED. The SARS-CoV-2 RNA is generally detectable in upper and lower  respiratory specimens dur ing the acute phase of infection.  Positive  results are indicative of active infection with SARS-CoV-2.  Clinical  correlation with patient history and other diagnostic information is  necessary to determine patient infection status.  Positive results do  not rule out bacterial infection or co-infection with other viruses. If result is PRESUMPTIVE POSTIVE SARS-CoV-2 nucleic acids MAY BE PRESENT.   A presumptive positive result was obtained on the submitted specimen  and confirmed on repeat testing.  While 2019 novel coronavirus  (SARS-CoV-2) nucleic acids may be present in the submitted sample  additional confirmatory testing may be necessary for epidemiological  and /  or clinical management purposes  to differentiate between  SARS-CoV-2 and other Sarbecovirus currently known to infect humans.  If clinically indicated additional testing with an alternate test  methodology [id number]) is advised. The SARS-CoV-2 RNA is generally  detectable in upper and lower respiratory sp ecimens during the acute  phase of infection. The expected result is Negative. Fact Sheet for Patients:  StrictlyIdeas.no Fact Sheet for Healthcare Providers: BankingDealers.co.za This test is not yet approved or cleared by the [place] FDA and has been authorized for detection and/or  diagnosis of SARS-CoV-2 by FDA under an Emergency Use Authorization (EUA).  This EUA will remain in effect (meaning this test can be used) for the duration of the COVID-19 declaration under Section 564(b)(1) of the Act, 21 U.S.C. section 360bbb-3(b)(1), unless the authorization is terminated or revoked sooner. Performed at [facility], [place] [address], [place], [place] [address]   Troponin I (High Sensitivity)     Status: Abnormal   Collection Time: 05/12/19  2:34 PM  Result Value Ref Range   Troponin I (High Sensitivity) 2,283 (HH) <18 ng/L    Comment: CRITICAL RESULT CALLED TO, READ BACK BY AND VERIFIED WITH: REABREB, A RN @ 1556 ON 05/12/2019 BY TEMOCHE, H Performed at [facility], [place] [address] [place], [place] [address]   Lactic acid, plasma     Status: None   Collection Time: 05/12/19  2:38 PM  Result Value Ref Range   Lactic Acid, Venous 0.8 0.5 - 1.9 mmol/L    Comment: Performed at [facility] [address], [place], [place] [address]  Blood Culture (routine x 2)     Status: None (Preliminary result)   Collection Time: 05/12/19  2:45 PM   Specimen: BLOOD  Result Value Ref Range   Specimen Description BLOOD RIGHT ANTECUBITAL    Special Requests      BOTTLES DRAWN AEROBIC AND ANAEROBIC Blood Culture results may not be optimal due to an excessive volume of blood received in culture bottles   Culture      NO GROWTH < 24 HOURS Performed at [facility] [address] [place], [place] [address]    Report Status PENDING   Prepare RBC     Status: None   Collection Time: 05/12/19  2:52 PM  Result Value Ref Range   Order Confirmation      ORDER PROCESSED BY BLOOD BANK Performed at [facility], [place] [address], [place], [place] [address]   Blood Culture (routine x 2)     Status: None (Preliminary result)   Collection Time: 05/12/19  3:09 PM   Specimen: BLOOD  Result Value Ref Range   Specimen Description BLOOD LEFT ANTECUBITAL     Special Requests      BOTTLES DRAWN AEROBIC AND ANAEROBIC Blood Culture adequate volume   Culture      NO GROWTH < 24 HOURS Performed at [facility], [place] [address] [place],  [address]    Report Status PENDING   POC occult blood, ED RN will collect     Status: Abnormal   Collection Time: 05/12/19  3:30 PM  Result Value Ref Range   Fecal Occult Bld POSITIVE (A) NEGATIVE  Urinalysis, Routine w reflex microscopic     Status: Abnormal   Collection Time: 05/12/19  4:00 PM  Result Value Ref Range   Color, Urine YELLOW YELLOW   APPearance HAZY (A) CLEAR   Specific Gravity, Urine 1.013  1.005 - 1.030   pH 5.0 5.0 - 8.0   Glucose, UA 50 (A) NEGATIVE mg/dL   Hgb urine dipstick SMALL (A) NEGATIVE   Bilirubin Urine NEGATIVE NEGATIVE   Ketones, ur NEGATIVE NEGATIVE mg/dL   Protein, ur 100 (A) NEGATIVE mg/dL   Nitrite NEGATIVE NEGATIVE   Leukocytes,Ua LARGE (A) NEGATIVE   RBC / HPF 6-10 0 - 5 RBC/hpf   WBC, UA >50 (H) 0 - 5 WBC/hpf   Bacteria, UA RARE (A) NONE SEEN   Squamous Epithelial / LPF 0-5 0 - 5   Mucus PRESENT     Comment: Performed at [facility], [place] [address] [place], [place] [address]  Lactic acid, plasma     Status: None   Collection Time: 05/12/19  6:32 PM  Result Value Ref Range   Lactic Acid, Venous 0.8 0.5 - 1.9 mmol/L    Comment: Performed at [facility] [address], [place], [place] [address]  Brain natriuretic peptide     Status: Abnormal   Collection Time: 05/12/19  6:32 PM  Result Value Ref Range   B Natriuretic Peptide 1,225.2 (H) 0.0 - 100.0 pg/mL    Comment: Performed at [facility] [address] [place], [place] [address]  Glucose, capillary     Status: Abnormal   Collection Time: 05/12/19  9:58 PM  Result Value Ref Range   Glucose-Capillary 114 (H) 70 - 99 mg/dL  Glucose, capillary     Status: Abnormal   Collection Time: 05/13/19  8:13 AM  Result Value Ref Range   Glucose-Capillary 112 (H) 70 - 99 mg/dL    Dg  Chest Port 1 View  Result Date: 05/12/2019 CLINICAL DATA:  Cough EXAM: PORTABLE CHEST 1 VIEW COMPARISON:  May 10, 2019 FINDINGS: There is cardiomegaly with pulmonary vascularity within normal limits. There are small pleural effusions bilaterally. There is consolidation in each lung base. Lungs elsewhere clear. No adenopathy. No bone lesions. IMPRESSION: Bibasilar airspace consolidation, most likely representing multifocal pneumonia. Small pleural effusions bilaterally. No evident interstitial edema. There is cardiomegaly. Electronically Signed   By: Lowella Grip III M.D.   On: 05/12/2019 16:12   ROS: Constitutional: Patient has had extreme fatigue. HEENT: Negative Cardiovascular: Denies chest pain Respiratory: Has been short of breath with exercise GI: No abdominal pain. GU: Has been urinating without difficulty Musculoskeletal: Negative  neuro/Psychiatric: Negative Endocrine/Heme: Dark tarry stools as noted above            Blood pressure (!) 107/58, pulse (!) 56, temperature 97.8 F (36.6 C), temperature source Oral, resp. rate (!) 26, height 6\' 3"  (1.905 m), weight 133.8 kg, SpO2 95 %.  Physical exam:   General--obese white male laying in bed ENT--nonicteric  Heart--regular rate and rhythm without murmurs or gallops Lungs--clear Abdomen--soft and nontender Psych--alert and oriented answers questions appropriately   Assessment: 1.  Anemia/heme positive stools.  Patient is now agreeable to EGD 2.  History of ulcers 3.  History of colon polyps 4.  UTI 5.  Recent respiratory failure decreased ejection fraction has refused any further cardiac work-up at this point 6.  Acute kidney injury superimposed on chronic kidney injury. 7.  Type 2 diabetes 8.  NSTEMI.  Patient has declined further cardiac evaluation at this point  Plan: Patient is now agreeable to moving ahead with EGD to try to determine if he is actually bleeding from an ulcer or if he needs further  work-up.  This would certainly be  important in trying to determine what type of cardiac medications to treat him with.  We will try to get this set up later today.  The only p.o. intake he has had today is his oral medications couple of hours ago with a sip of water.  He knows he needs to remain n.p.o.   Nancy Fetter 05/13/2019, 9:45 AM   This note was created using voice recognition software and minor errors may Have occurred unintentionally. Pager: [phone] If no answer or after hours call [phone]

## 2019-05-13 NOTE — Anesthesia Postprocedure Evaluation (Signed)
Anesthesia Post Note  Patient: Louis Bradley  Procedure(s) Performed: ESOPHAGOGASTRODUODENOSCOPY (EGD) (N/A ) BIOPSY     Patient location during evaluation: PACU Anesthesia Type: MAC Level of consciousness: awake and alert Pain management: pain level controlled Vital Signs Assessment: post-procedure vital signs reviewed and stable Respiratory status: spontaneous breathing Cardiovascular status: stable Anesthetic complications: no    Last Vitals:  Vitals:   05/13/19 1600 05/13/19 1611  BP: 121/78 111/62  Pulse: (!) 53 (!) 55  Resp: (!) 26 (!) 22  Temp:    SpO2: 92% 91%    Last Pain:  Vitals:   05/13/19 1600  TempSrc:   PainSc: 0-No pain                 Nolon Nations

## 2019-05-13 NOTE — Progress Notes (Signed)
PROGRESS NOTE  Shahiem Bedwell Everhart PFX:[id number] DOB: March 02, 1946 DOA: 05/12/2019 PCP: Colon Branch, MD   LOS: 1 day   Patient is from: Home  Brief Narrative / Interim history: 73 year old male with history of HTN, CKD-4, OSA on CPAP, recent hospitalization from 6/25-6/26 for GIB/NSTEMI presenting with generalized weakness, shortness of breath, weight cough and melena. Reportedly declined EGD and LHC at his recent hospitalization for GIB and end STEMI and went home.  In ED, low BP to 88/49 that is improved with IV fluids.  Mild low temperature to 96.  WBC 12.5.  Hgb 6.9 (6.8 on 6/26 prior to discharge although received 1 unit after that).  Sodium 132.  BUN 134.  Creatinine 5.6.  Troponin 2283.  BNP 1225 (about the same on 6/25).  Lactic acid 0.8.  Urinalysis suggestive for UTI.  CXR concerning for multifocal airspace opacities concerning for pneumonia.  FOBT positive. Patient was started on vancomycin and cefepime empirically for pneumonia and UTI.  One unit blood ordered and admitted for further care.  Subjective: Reports feeling tired.  Complains about right shoulder pain.  Reports mechanical fall at home while using bathroom.  He says he missed the commode and fell on the right.  Denies hitting his head or loss of consciousness.  He says his shoulder has been sore since then.  Denies fever, URI symptoms, cough, chest pain, dyspnea or orthopnea..  Denies GI or GU symptoms.  Endorses melena.  Denies hematochezia.  Did not wear CPAP last night.  Objective: Vitals:   05/13/19 0000 05/13/19 0021 05/13/19 0403 05/13/19 0730  BP: (!) 91/58  99/61 (!) 107/58  Pulse: (!) 56 (!) 58 60 (!) 56  Resp: 18 18 (!) 22 (!) 26  Temp:   97.8 F (36.6 C)   TempSrc:   Oral   SpO2: 95% 90%  95%  Weight:   133.8 kg   Height:        Intake/Output Summary (Last 24 hours) at 05/13/2019 0759 Last data filed at 05/13/2019 0458 Gross per 24 hour  Intake 915 ml  Output 450 ml  Net 465 ml   Filed Weights   05/12/19 1602 05/12/19 1826 05/13/19 0403  Weight: 134.7 kg 132.9 kg 133.8 kg    Examination:  GENERAL: No acute distress.  Appears well.  HEENT: MMM.  Vision and hearing grossly intact.  NECK: Supple.  Positive for JVD. LUNGS: 94% on 6 L by [place].  No IWOB.  Diminished aeration bilaterally.  Fine crackles bilaterally. HEART:  RRR.  2/6 SEM over LUSB and LLSB. ABD: Bowel sounds present. Soft. Non tender.  MSK/EXT:  Moves all extremities. No apparent deformity. No edema bilaterally.  SKIN: no apparent skin lesion or wound NEURO: Awake, alert and oriented appropriately.  No gross deficit.  PSYCH: Calm. Normal affect.   I have personally reviewed the following labs and images: CBC: Recent Labs  Lab 05/10/19 1827 05/11/19 0935 05/12/19 1434  WBC 14.3* 13.7* 12.5*  NEUTROABS  --   --  11.3*  HGB 5.7* 6.8* 6.9*  HCT 18.5* 20.9* 21.7*  MCV 93.0 90.1 91.6  PLT 215 202 329   Basic Metabolic Panel: Recent Labs  Lab 05/10/19 1827 05/10/19 1924 05/11/19 0935 05/12/19 1434  NA 135  --  137 132*  K 5.2*  --  4.6 4.7  CL 105  --  106 101  CO2 22  --  20* 18*  GLUCOSE 176*  --  139* 182*  BUN 115*  --  119* 134*  CREATININE 5.24*  --  5.29* 5.60*  CALCIUM 8.1*  --  8.1* 7.8*  MG  --  3.0* 2.9*  --    GFR: Estimated Creatinine Clearance: 17.3 mL/min (A) (by C-G formula based on SCr of 5.6 mg/dL (H)). Liver Function Tests: Recent Labs  Lab 05/10/19 1924 05/12/19 1434  AST 18 13*  ALT 19 15  ALKPHOS 52 50  BILITOT 0.5 0.4  PROT 6.1* 5.8*  ALBUMIN 3.2* 3.0*   No results for input(s): LIPASE, AMYLASE in the last 168 hours. No results for input(s): AMMONIA in the last 168 hours. Coagulation Profile: Recent Labs  Lab 05/10/19 1924  INR 1.1   Cardiac Enzymes: No results for input(s): CKTOTAL, CKMB, CKMBINDEX, TROPONINI in the last 168 hours. BNP (last 3 results) No results for input(s): PROBNP in the last 8760 hours. HbA1C: Recent Labs    05/11/19 0935  HGBA1C 5.9*    CBG: Recent Labs  Lab 05/10/19 2325 05/11/19 0252 05/11/19 0719 05/11/19 1107 05/12/19 2158  GLUCAP 124* 119* 121* 139* 114*   Lipid Profile: Recent Labs    05/11/19 0935  CHOL 83  HDL 24*  LDLCALC 32  TRIG 136  CHOLHDL 3.5   Thyroid Function Tests: No results for input(s): TSH, T4TOTAL, FREET4, T3FREE, THYROIDAB in the last 72 hours. Anemia Panel: No results for input(s): VITAMINB12, FOLATE, FERRITIN, TIBC, IRON, RETICCTPCT in the last 72 hours. Urine analysis:    Component Value Date/Time   COLORURINE YELLOW 05/12/2019 1600   APPEARANCEUR HAZY (A) 05/12/2019 1600   LABSPEC 1.013 05/12/2019 1600   PHURINE 5.0 05/12/2019 1600   GLUCOSEU 50 (A) 05/12/2019 1600   HGBUR SMALL (A) 05/12/2019 1600   BILIRUBINUR NEGATIVE 05/12/2019 1600   KETONESUR NEGATIVE 05/12/2019 1600   PROTEINUR 100 (A) 05/12/2019 1600   NITRITE NEGATIVE 05/12/2019 1600   LEUKOCYTESUR LARGE (A) 05/12/2019 1600   Sepsis Labs: Invalid input(s): PROCALCITONIN, LACTICIDVEN  Recent Results (from the past 240 hour(s))  Novel Coronavirus,NAA,(SEND-OUT TO REF LAB - TAT 24-48 hrs); Hosp Order     Status: None   Collection Time: 05/10/19  7:39 PM   Specimen: Nasopharyngeal Swab; Respiratory  Result Value Ref Range Status   SARS-CoV-2, NAA NOT DETECTED NOT DETECTED Final    Comment: (NOTE) This test was developed and its performance characteristics determined by [employer]. This test has not been FDA cleared or approved. This test has been authorized by FDA under an Emergency Use Authorization (EUA). This test is only authorized for the duration of time the declaration that circumstances exist justifying the authorization of the emergency use of in vitro diagnostic tests for detection of SARS-CoV-2 virus and/or diagnosis of COVID-19 infection under section 564(b)(1) of the Act, 21 U.S.C. 323FTD-3(U)(2), unless the authorization is terminated or revoked sooner. When diagnostic testing is  negative, the possibility of a false negative result should be considered in the context of a patient's recent exposures and the presence of clinical signs and symptoms consistent with COVID-19. An individual without symptoms of COVID-19 and who is not shedding SARS-CoV-2 virus would expect to have a negative (not detected) result in this assay. Performed  At: [facility] [address] [place], [place] [id number] Rush Farmer MD BJ:[id number]    [facility]  Final    Comment: Performed at [facility], [place] [address] [place], [place] [address]  SARS Coronavirus 2 (CEPHEID- Performed in [facility] hospital lab), [facility] Order     Status: None  Collection Time: 05/12/19  2:34 PM   Specimen: Nasopharyngeal Swab  Result Value Ref Range Status   SARS Coronavirus 2 NEGATIVE NEGATIVE Final    Comment: (NOTE) If result is NEGATIVE SARS-CoV-2 target nucleic acids are NOT DETECTED. The SARS-CoV-2 RNA is generally detectable in upper and lower  respiratory specimens during the acute phase of infection. The lowest  concentration of SARS-CoV-2 viral copies this assay can detect is 250  copies / mL. A negative result does not preclude SARS-CoV-2 infection  and should not be used as the sole basis for treatment or other  patient management decisions.  A negative result may occur with  improper specimen collection / handling, submission of specimen other  than nasopharyngeal swab, presence of viral mutation(s) within the  areas targeted by this assay, and inadequate number of viral copies  (<250 copies / mL). A negative result must be combined with clinical  observations, patient history, and epidemiological information. If result is POSITIVE SARS-CoV-2 target nucleic acids are DETECTED. The SARS-CoV-2 RNA is generally detectable in upper and lower  respiratory specimens dur ing the acute phase of infection.  Positive  results are indicative of active  infection with SARS-CoV-2.  Clinical  correlation with patient history and other diagnostic information is  necessary to determine patient infection status.  Positive results do  not rule out bacterial infection or co-infection with other viruses. If result is PRESUMPTIVE POSTIVE SARS-CoV-2 nucleic acids MAY BE PRESENT.   A presumptive positive result was obtained on the submitted specimen  and confirmed on repeat testing.  While 2019 novel coronavirus  (SARS-CoV-2) nucleic acids may be present in the submitted sample  additional confirmatory testing may be necessary for epidemiological  and / or clinical management purposes  to differentiate between  SARS-CoV-2 and other Sarbecovirus currently known to infect humans.  If clinically indicated additional testing with an alternate test  methodology [phone]) is advised. The SARS-CoV-2 RNA is generally  detectable in upper and lower respiratory sp ecimens during the acute  phase of infection. The expected result is Negative. Fact Sheet for Patients:  StrictlyIdeas.no Fact Sheet for Healthcare Providers: BankingDealers.co.za This test is not yet approved or cleared by the [place] FDA and has been authorized for detection and/or diagnosis of SARS-CoV-2 by FDA under an Emergency Use Authorization (EUA).  This EUA will remain in effect (meaning this test can be used) for the duration of the COVID-19 declaration under Section 564(b)(1) of the Act, 21 U.S.C. section 360bbb-3(b)(1), unless the authorization is terminated or revoked sooner. Performed at [facility], [place] [address], [place], [place] [address]      Radiology Studies: Dg Chest Port 1 View  Result Date: 05/12/2019 CLINICAL DATA:  Cough EXAM: PORTABLE CHEST 1 VIEW COMPARISON:  May 10, 2019 FINDINGS: There is cardiomegaly with pulmonary vascularity within normal limits. There are small pleural effusions bilaterally. There  is consolidation in each lung base. Lungs elsewhere clear. No adenopathy. No bone lesions. IMPRESSION: Bibasilar airspace consolidation, most likely representing multifocal pneumonia. Small pleural effusions bilaterally. No evident interstitial edema. There is cardiomegaly. Electronically Signed   By: Lowella Grip III M.D.   On: 05/12/2019 16:12     Procedures:  None  6/26: Echo: EF of 47%, diastolic dysfunction, hypokinesis of apical anterior wall, apical septal wall and lateral apex.  Moderate LAE.  Moderate AS.  PASP 53 mmHg.   Microbiology: 6/27: Blood cultures no growth so far 6/27: COVID-19 screen negative 6/27: Urine culture pending  6/27: Sputum culture pending  Assessment & Plan: Acute respiratory failure: Multifactorial including acute on chronic CHF, non-STEMI, anemia and possible pneumonia.  Chest x-ray reviewed by me favor CHF versus pneumonia. -Treat treatable causes as above -Wean oxygen as able  Acute on chronic combined CHF/moderate pulmonary hypertension: patient with dyspnea.  Bibasilar crackles on exam.  Difficult to assess JVD.  Trace edema bilaterally.  BNP 1225 (1114 two days prior). -We will get 2 view CXR -We will consider Lasix challenge -Daily weight, intake output and renal functions -Salt and fluid restrictions -Closely monitor electrolytes and replenish aggressively  Symptomatic anemia due to GI bleed: Continues to endorse melanotic stool.  Hemoglobin 6.9 -Transfused 1 unit.  Has not had blood draw yet this morning -Monitor H&H -Start IV Protonix 40 mg twice daily. -Follow GI rec  Multifocal pneumonia?:  CXR favors fluid versus infectious process. Mild low temp to 96 F and mild leukocytosis.  Lactic acid negative.  Cultures negative so far.  COVID-19 screen negative. -Discontinue vancomycin -We will continue cefepime for now. -We will obtain 2 view x-ray and procalcitonin -Follow sputum culture  Pyuria versus UTI: Patient with UTI symptoms.   Urinalysis with large LE -Continue cefepime pending cultures.  Non-STEMI: Troponin elevated to 2200.  EKG with some T wave inversions which seems to be old.  -No anticoagulation due to GI bleed -Patient continues to decline heart catheterization out of concern for his kidney.  -We will start cardiac meds at low-dose  CKD-5/azotemia: Creatinine 5.6 (5.29 when discharged on 6/26).  Recent values range from 4.5-5.5.  134 (119 on 6/26).  Azotemia could be due to GI bleed and renal failure. -Nephrology involved. -Follow morning labs-has not been drawn yet.  Well-controlled NIDDM-2 with renal complication and C5 0.9 on 6/26.  On glipizide at home. -SSI-renal -Continue statin  OSA on CPAP: Refused CPAP last night. -Encourage nightly CPAP  DVT prophylaxis: SCD due to GI bleed Code Status: Full code Family Communication: Attempted to call patient's wife over the phone but no answer.  Did not leave voicemail. Disposition Plan: Remains inpatient for CHF exacerbation, GI bleed and end-stage Consultants: Nephrology and gastroenterology  Antimicrobials: Anti-infectives (From admission, onward)   Start     Dose/Rate Route Frequency Ordered Stop   05/14/19 1800  vancomycin (VANCOCIN) IVPB 1000 mg/200 mL premix     1,000 mg 200 mL/hr over 60 Minutes Intravenous Every 48 hours 05/12/19 1633     05/13/19 1800  vancomycin (VANCOCIN) IVPB 1000 mg/200 mL premix  Status:  Discontinued     1,000 mg 200 mL/hr over 60 Minutes Intravenous Every 48 hours 05/12/19 1632 05/12/19 1633   05/13/19 1700  ceFEPIme (MAXIPIME) 2 g in sodium chloride 0.9 % 100 mL IVPB     2 g 200 mL/hr over 30 Minutes Intravenous Every 24 hours 05/12/19 1632     05/12/19 1630  vancomycin (VANCOCIN) 2,500 mg in sodium chloride 0.9 % 500 mL IVPB     2,500 mg 250 mL/hr over 120 Minutes Intravenous  Once 05/12/19 1626 05/12/19 2030   05/12/19 1615  ceFEPIme (MAXIPIME) 2 g in sodium chloride 0.9 % 100 mL IVPB     2 g 200 mL/hr over  30 Minutes Intravenous  Once 05/12/19 1604 05/12/19 1732      Scheduled Meds: . [START ON 05/14/2019] calcitRIOL  0.25 mcg Oral Q M,W,F  . guaiFENesin  600 mg Oral BID  . insulin aspart  0-9 Units Subcutaneous TID WC  . pravastatin  40 mg Oral QHS  . sodium bicarbonate  650 mg Oral Daily  . sodium chloride flush  3 mL Intravenous Q12H  . [START ON 05/14/2019] sodium zirconium cyclosilicate  10 g Oral Once per day on Mon Wed Fri   Continuous Infusions: . sodium chloride    . ceFEPime (MAXIPIME) IV    . [START ON 05/14/2019] vancomycin     PRN Meds:.acetaminophen **OR** acetaminophen, albuterol, nicotine polacrilex  Neil Errickson T. [place]  If 7PM-7AM, please contact night-coverage [email or web address] Password [facility] 05/13/2019, 7:59 AM

## 2019-05-14 ENCOUNTER — Inpatient Hospital Stay (HOSPITAL_COMMUNITY): Payer: Medicare Other

## 2019-05-14 ENCOUNTER — Encounter (HOSPITAL_COMMUNITY): Payer: Self-pay | Admitting: Physician Assistant

## 2019-05-14 DIAGNOSIS — R7989 Other specified abnormal findings of blood chemistry: Secondary | ICD-10-CM

## 2019-05-14 DIAGNOSIS — E8770 Fluid overload, unspecified: Secondary | ICD-10-CM

## 2019-05-14 DIAGNOSIS — I350 Nonrheumatic aortic (valve) stenosis: Secondary | ICD-10-CM

## 2019-05-14 DIAGNOSIS — I272 Pulmonary hypertension, unspecified: Secondary | ICD-10-CM

## 2019-05-14 DIAGNOSIS — N19 Unspecified kidney failure: Secondary | ICD-10-CM

## 2019-05-14 DIAGNOSIS — I480 Paroxysmal atrial fibrillation: Secondary | ICD-10-CM

## 2019-05-14 DIAGNOSIS — I4891 Unspecified atrial fibrillation: Secondary | ICD-10-CM

## 2019-05-14 DIAGNOSIS — N179 Acute kidney failure, unspecified: Secondary | ICD-10-CM

## 2019-05-14 DIAGNOSIS — R195 Other fecal abnormalities: Secondary | ICD-10-CM

## 2019-05-14 DIAGNOSIS — R0902 Hypoxemia: Secondary | ICD-10-CM

## 2019-05-14 LAB — RENAL FUNCTION PANEL
Albumin: 2.6 g/dL — ABNORMAL LOW (ref 3.5–5.0)
Anion gap: 13 (ref 5–15)
BUN: 146 mg/dL — ABNORMAL HIGH (ref 8–23)
CO2: 19 mmol/L — ABNORMAL LOW (ref 22–32)
Calcium: 7.6 mg/dL — ABNORMAL LOW (ref 8.9–10.3)
Chloride: 107 mmol/L (ref 98–111)
Creatinine, Ser: 5.54 mg/dL — ABNORMAL HIGH (ref 0.61–1.24)
GFR calc Af Amer: 11 mL/min — ABNORMAL LOW (ref >60)
GFR calc non Af Amer: 9 mL/min — ABNORMAL LOW (ref >60)
Glucose, Bld: 89 mg/dL (ref 70–99)
Phosphorus: 6.9 mg/dL — ABNORMAL HIGH (ref 2.5–4.6)
Potassium: 4.4 mmol/L (ref 3.5–5.1)
Sodium: 139 mmol/L (ref 135–145)

## 2019-05-14 LAB — CBC WITH DIFFERENTIAL/PLATELET
Abs Immature Granulocytes: 0.07 10*3/uL (ref 0.00–0.07)
Basophils Absolute: 0.1 10*3/uL (ref 0.0–0.1)
Basophils Relative: 1 %
Eosinophils Absolute: 0.4 10*3/uL (ref 0.0–0.5)
Eosinophils Relative: 4 %
HCT: 21.6 % — ABNORMAL LOW (ref 39.0–52.0)
Hemoglobin: 7 g/dL — ABNORMAL LOW (ref 13.0–17.0)
Immature Granulocytes: 1 %
Lymphocytes Relative: 8 %
Lymphs Abs: 0.8 10*3/uL (ref 0.7–4.0)
MCH: 29.7 pg (ref 26.0–34.0)
MCHC: 32.4 g/dL (ref 30.0–36.0)
MCV: 91.5 fL (ref 80.0–100.0)
Monocytes Absolute: 0.6 10*3/uL (ref 0.1–1.0)
Monocytes Relative: 7 %
Neutro Abs: 7.8 10*3/uL — ABNORMAL HIGH (ref 1.7–7.7)
Neutrophils Relative %: 79 %
Platelets: 177 10*3/uL (ref 150–400)
RBC: 2.36 MIL/uL — ABNORMAL LOW (ref 4.22–5.81)
RDW: 15.1 % (ref 11.5–15.5)
WBC: 9.6 10*3/uL (ref 4.0–10.5)
nRBC: 0.2 % (ref 0.0–0.2)

## 2019-05-14 LAB — GLUCOSE, CAPILLARY
Glucose-Capillary: 93 mg/dL (ref 70–99)
Glucose-Capillary: 171 mg/dL — ABNORMAL HIGH (ref 70–99)
Glucose-Capillary: 139 mg/dL — ABNORMAL HIGH (ref 70–99)
Glucose-Capillary: 132 mg/dL — ABNORMAL HIGH (ref 70–99)

## 2019-05-14 LAB — URINE CULTURE

## 2019-05-14 LAB — LEGIONELLA PNEUMOPHILA SEROGP 1 UR AG: L. pneumophila Serogp 1 Ur Ag: NEGATIVE

## 2019-05-14 LAB — MAGNESIUM: Magnesium: 3 mg/dL — ABNORMAL HIGH (ref 1.7–2.4)

## 2019-05-14 MED ORDER — DARBEPOETIN ALFA 60 MCG/0.3ML IJ SOSY
60.0000 ug | PREFILLED_SYRINGE | Freq: Once | INTRAMUSCULAR | Status: AC
  Administered 2019-05-14: 60 ug via SUBCUTANEOUS
  Filled 2019-05-14: qty 0.3

## 2019-05-14 MED ORDER — SODIUM CHLORIDE 0.9 % IV SOLN
510.0000 mg | Freq: Once | INTRAVENOUS | Status: AC
  Administered 2019-05-14: 510 mg via INTRAVENOUS
  Filled 2019-05-14: qty 17

## 2019-05-14 MED ORDER — SODIUM CHLORIDE 0.9% IV SOLUTION
Freq: Once | INTRAVENOUS | Status: AC
  Administered 2019-05-14: 18:00:00 via INTRAVENOUS

## 2019-05-14 MED ORDER — SODIUM BICARBONATE 650 MG PO TABS
650.0000 mg | ORAL_TABLET | Freq: Two times a day (BID) | ORAL | Status: DC
  Administered 2019-05-14 – 2019-05-17 (×6): 650 mg via ORAL
  Filled 2019-05-14 (×6): qty 1

## 2019-05-14 MED ORDER — CALCIUM ACETATE (PHOS BINDER) 667 MG PO CAPS
667.0000 mg | ORAL_CAPSULE | Freq: Three times a day (TID) | ORAL | Status: DC
  Administered 2019-05-14 – 2019-05-18 (×8): 667 mg via ORAL
  Filled 2019-05-14 (×9): qty 1

## 2019-05-14 MED ORDER — CARVEDILOL 6.25 MG PO TABS
6.2500 mg | ORAL_TABLET | Freq: Two times a day (BID) | ORAL | Status: DC
  Administered 2019-05-14 – 2019-05-16 (×4): 6.25 mg via ORAL
  Filled 2019-05-14 (×6): qty 1

## 2019-05-14 MED ORDER — FUROSEMIDE 10 MG/ML IJ SOLN
80.0000 mg | Freq: Once | INTRAMUSCULAR | Status: AC
  Administered 2019-05-14: 19:00:00 80 mg via INTRAVENOUS
  Filled 2019-05-14: qty 8

## 2019-05-14 MED ORDER — ATORVASTATIN CALCIUM 10 MG PO TABS
20.0000 mg | ORAL_TABLET | Freq: Every day | ORAL | Status: DC
  Administered 2019-05-14 – 2019-05-17 (×4): 20 mg via ORAL
  Filled 2019-05-14 (×4): qty 2

## 2019-05-14 NOTE — Progress Notes (Addendum)
Dr. Cyndia Skeeters text paged again via [place] in regards to pt's family request for an update  Tel # [phone]. Will continue to follow-up.  Dr. Cyndia Skeeters text paged via [place] regarding pt's famiy request to be updated with his condition and treatments. Will contnue to follow-up.

## 2019-05-14 NOTE — Progress Notes (Signed)
PT Cancellation Note  Patient Details Name: Louis Bradley MRN: [id number] DOB: 01-15-1946   Cancelled Treatment:    Reason Eval/Treat Not Completed: Fatigue/lethargy limiting ability to participate.  States he has been OOB mult times today and is able to walk, but asks to wait to get up tomorrow.  Re-attempt therapy then.   Ramond Dial 05/14/2019, 5:36 PM   Mee Hives, PT MS Acute Rehab Dept. Number: [place] [facility] [place]

## 2019-05-14 NOTE — Consult Note (Addendum)
Cardiology Consultation:   Patient ID: Louis Bradley; [id number]; 1945-11-28   Admit date: 05/12/2019 Date of Consult: 05/14/2019  Primary Care Provider: Colon Branch, MD Primary Cardiologist: Candee Furbish, MD Primary Electrophysiologist:  None  Chief Complaint: weakness  Patient Profile:   JAHKEEM KURKA is a 73 y.o. male with a hx of CKD stage V (not yet on HD, has undergone mapping), DM, Fournier gangrene, moderate AS, obesity, OSA on CPAP, HTN, HLD, L toe osteomyelitis, anemia, arthritis, recent NSTEMI, acute on chronic anemia and suspected GIB, pulmonary HTN by echo 04/2019 who is being seen today for the evaluation of NSTEMI at the request of Dr. Mannie Stabile. Telemetry also reveals incidental pickup of atrial fibrillation.  History of Present Illness:   He was previously cared for in [place] with long history of heart murmur felt to reflect moderate aortic stenosis. He denies known hx of CAD. He had recently underwent a left foot toe amputation which he initially recovered from then developed weakness, dizziness and near-falling. He had been taking a full dose ASA daily but no NSAIDS. He had had melena x 1 week. He was recently admitted 6/25-6/26 with hypotension. He was found to have acute on chronic anemia with Hgb 5.7 (previously 11) and + FOBT. Due to abnormal EKG, troponins were checked which peaked at 2052 felt c/w NSTEMI. He had not had any recent chest pain. He was treated with 2 U PRBCs. He initially refused endoscopy. Cardiac cath was suggested by Dr. Marlou Porch with noted risk of progression to ESRD but the patient declined this as well. 2D echo 05/11/19 showed EF 50%, pseudonormalization, elevated LVEDP, hypokinesis of the apical anterior wall, the apical septal wall, and the true apex, normal RV, moderate LAE, mild MR, moderate aortic stenosis, mild RAE with PASP 53mmHg. The patient requested to be discharged although his Hgb at time of DC was 6.8.  He presented back to the hospital on 05/12/19  with continued weakness, dyspnea, and hypoxia. He was hypothermic with hypotension and leukocytosis. CXR showed possible multifocal PNA and potential UTI for which he has received antibiotics. Hgb remained low at 6.9 and stool guiaic was again positive. He received additional blood transfusion which brought Hgb to 7.9 but fell again this AM to 7.0. hsTroponin 2283 on admission. Nephrology managing volume overload with IV Lasix. It appears he may be headed towards HD (BUN 145, Cr 5.54) but the patient wanted to wait 1-2 days to make a decision. He was agreeable to EGD yesterday showing gastritis/duodenitis without acute bleeding. Daily PPI recommended for 8 weeks with outpatient f/u. Patient had reported colonoscopy in [place] 6-8 months ago so this was not pursued. GI has since s/o.  Initial EKG 6/27 showed NSR but telemetry reveals he was in atrial fib when telemetry started that evening, and converted to NSR/SB yesterday at 7:23am. He was not on any blood thinners as outpatient and did not receive any while inpatient. The patient continues to deny any chest pain. He does report some dyspnea in the week prior to admission but this has improved with above measures.    Past Medical History:  Diagnosis Date   Anemia    Anesthesia complication    General anesthesia makes me feel foggy for long periods of time   Arthritis    CKD (chronic kidney disease) stage V requiring chronic dialysis ([place])    Stage 4-5   Complication of anesthesia    slow to come out of anesthesia - a lot of  confusion    Diabetes ([place])    Fournier gangrene    GI bleed    Heart murmur    Born with, no problems   History of blood transfusion    x2   Hyperlipidemia    Hypertension    NSTEMI (non-ST elevated myocardial infarction) ([place])    Obesity    OSA on CPAP    Osteomyelitis ([place]) 2016   L toe   Pulmonary hypertension ([place])    Renal atrophy, left    Vitamin D deficiency     Past Surgical History:    Procedure Laterality Date   AMPUTATION TOE Left 04/20/2019   Procedure: AMPUTATION LEFT SECOND AND THIRD TOES;  Surgeon: Newt Minion, MD;  Location: [facility];  Service: Orthopedics;  Laterality: Left;   BIOPSY  05/13/2019   Procedure: BIOPSY;  Surgeon: Laurence Spates, MD;  Location: [place];  Service: Endoscopy;;   COLONOSCOPY     COLOSTOMY  03/2015   COLOSTOMY REVERSAL     ESOPHAGOGASTRODUODENOSCOPY N/A 05/13/2019   Procedure: ESOPHAGOGASTRODUODENOSCOPY (EGD);  Surgeon: Laurence Spates, MD;  Location: [facility] ENDOSCOPY;  Service: Endoscopy;  Laterality: N/A;   TOE AMPUTATION Left 2016   Left big toe amputated due to MRSA infection     Inpatient Medications: Scheduled Meds:  sodium chloride   Intravenous Once   atorvastatin  20 mg Oral q1800   calcitRIOL  0.25 mcg Oral Q M,W,F   calcium acetate  667 mg Oral TID WC   carvedilol  6.25 mg Oral BID WC   darbepoetin (ARANESP) injection - NON-DIALYSIS  60 mcg Subcutaneous Once   furosemide  80 mg Intravenous Once   guaiFENesin  600 mg Oral BID   insulin aspart  0-9 Units Subcutaneous TID WC   pantoprazole (PROTONIX) IV  40 mg Intravenous Q12H   sodium bicarbonate  650 mg Oral BID   sodium chloride flush  3 mL Intravenous Q12H   sodium zirconium cyclosilicate  10 g Oral Once per day on Mon Wed Fri   Continuous Infusions:  sodium chloride     ceFEPime (MAXIPIME) IV 2 g (05/13/19 1700)   doxycycline (VIBRAMYCIN) IV 100 mg (05/14/19 0522)   ferumoxytol     PRN Meds: acetaminophen **OR** acetaminophen, albuterol, nicotine polacrilex  Home Meds: Prior to Admission medications   Medication Sig Start Date End Date Taking? Authorizing Provider  amLODipine (NORVASC) 10 MG tablet Take 1 tablet (10 mg total) by mouth daily. 03/13/19  Yes Colon Branch, MD  calcitRIOL (ROCALTROL) 0.25 MCG capsule Take 0.25 mcg by mouth every Monday, Wednesday, and Friday.  03/22/19  Yes [provider]  carvedilol (COREG) 25 MG  tablet Take 1 tablet (25 mg total) by mouth 2 (two) times daily with a meal. Patient taking differently: Take 25 mg by mouth 2 (two) times a day.  03/13/19  Yes Paz, Alda Berthold, MD  furosemide (LASIX) 40 MG tablet Take 2 tablets (80 mg total) by mouth daily. 03/13/19  Yes Paz, Alda Berthold, MD  glipiZIDE (GLUCOTROL) 5 MG tablet Take 2 tablets (10 mg total) by mouth 2 (two) times daily. 04/20/19  Yes Paz, Alda Berthold, MD  nicotine polacrilex (COMMIT) 4 MG lozenge Take 4 mg by mouth as needed for smoking cessation.   Yes [provider]  pantoprazole (PROTONIX) 40 MG tablet Take 1 tablet (40 mg total) by mouth 2 (two) times daily. 05/11/19  Yes Allie Bossier, MD  pravastatin (PRAVACHOL) 40 MG tablet Take 1 tablet (40  mg total) by mouth at bedtime. 03/13/19  Yes Colon Branch, MD  Probiotic Product (PROBIOTIC DAILY PO) Take 1 tablet by mouth every Monday, Wednesday, and Friday.    Yes [provider]  sodium bicarbonate 650 MG tablet Take 650 mg by mouth daily. 03/20/19  Yes [provider]  sodium zirconium cyclosilicate (LOKELMA) 10 g PACK packet Take 10 g by mouth daily. Use as directed by nephrology Patient taking differently: Take 10 g by mouth every Monday, Wednesday, and Friday. 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 strain: Not on file   Food insecurity    Worry: Not on file    Inability: Not on file   Transportation needs    Medical: Not on file    Non-medical: Not on file  Tobacco Use   Smoking status: Former Smoker   Smokeless tobacco: Never Used    Tobacco comment: quit 12/2018 (1 ppd)  Substance and Sexual Activity   Alcohol use: Yes    Comment: social   Drug use: Never   Sexual activity: Not on file  Lifestyle   Physical activity    Days per week: Not on file    Minutes per session: Not on file   Stress: Not on file  Relationships   Social connections    Talks on phone: Not on file    Gets together: Not on file    Attends religious service: Not on file    Active member of club or organization: Not on file    Attends meetings of clubs or organizations: Not on file    Relationship status: Not on file   Intimate partner violence    Fear of current or ex partner: Not on file    Emotionally abused: Not on file    Physically abused: Not on file    Forced sexual activity: Not on file  Other Topics Concern   Not on file  Social History Narrative   Patient and his wife moved from [place] on 02/14/2021 live with his daughter    Family History:   The patient's family history includes CAD in an other family member; CAD (age of onset: 48) in his father. There is no history of Colon cancer or Prostate cancer.  ROS:  Please see the history of present illness.  All other ROS reviewed and negative.     Physical Exam/Data:   Vitals:   05/13/19 1958 05/13/19 2000 05/13/19 2241 05/14/19 0553  BP: (!) 120/53   126/66  Pulse: (!) 57 (!) 59 (!) 54 61  Resp: 20 (!) 22 18 20   Temp: (!) 97.4 F (36.3 C)   97.8 F (36.6 C)  TempSrc: Oral   Axillary  SpO2: 93% 92% 95% 97%  Weight:    135.2 kg  Height:        Intake/Output Summary (Last 24 hours) at 05/14/2019 1345 Last data filed at 05/14/2019 0500 Gross per 24 hour  Intake 920.33 ml  Output 1425 ml  Net -504.67 ml  Last 3 Weights 05/14/2019 05/13/2019 05/13/2019  Weight (lbs) 298 lb 295 lb 295 lb  Weight (kg) 135.172 kg 133.811 kg 133.811 kg    Body mass index is 37.25 kg/m.  General: Well developed, well nourished obese WM, in no acute distress. Head: Normocephalic,  atraumatic, sclera non-icteric, no xanthomas, nares are without discharge.  Neck: Negative for carotid bruits. JVD not elevated. Lungs: Mildly diminished throughout bilaterally to auscultation without wheezes, rales, or rhonchi. Breathing is unlabored. Heart: RRR with S1 S2. 3/6 SEM at RUSB. No rubs or gallops appreciated. Abdomen: Soft, non-tender, non-distended with normoactive bowel sounds. No hepatomegaly. No rebound/guarding. No obvious abdominal masses. Msk:  Strength and tone appear normal for age. Extremities: No clubbing or cyanosis. No edema.  Distal pedal pulses are 2+ and equal bilaterally. Neuro: Alert and oriented X 3. No facial asymmetry. No focal deficit. Moves all extremities spontaneously. Psych:  Responds to questions appropriately with a normal affect.  EKG:  The EKG was personally reviewed:  Appears to show atrial fib 66bpm with incomplete LBBB, diffuse TWI  I, II, II, avF, V3-V6, QTc 447ms. TW changes are similar to 6/27 although he was in sinus at that time.  Relevant CV Studies: Most recent pertinent cardiac studies are outlined above. 2d echo 05/11/19 IMPRESSIONS  1. The left ventricle has a visually estimated ejection fraction of 50%. The cavity size was mildly dilated. Left ventricular diastolic Doppler parameters are consistent with pseudonormalization. Elevated left ventricular end-diastolic pressure.  Hypokinesis of the apical anterior wall, the apical septal wall, and the true apex.  2. The right ventricle has normal systolic function. The cavity was normal. There is no increase in right ventricular wall thickness.  3. Left atrial size was moderately dilated.  4. There is moderate mitral annular calcification present. No evidence of mitral valve stenosis. Mild mitral regurgitation.  5. The aortic valve is tricuspid. Moderate calcification of the aortic valve. Moderate stenosis of the aortic valve. Mean gradient 33 mmHg with AVA 1.29 cm^2.  6. The aortic root is  normal in size and structure.  7. Right atrial size was mildly dilated.  8. Trivial pericardial effusion is present.  9. The inferior vena cava was dilated in size with <50% respiratory variability. PA systolic pressure 53 mmHg.  Laboratory Data:  Chemistry Recent Labs  Lab 05/12/19 1434 05/13/19 1153 05/14/19 0643  NA 132* 136 139  K 4.7 4.7 4.4  CL 101 106 107  CO2 18* 19* 19*  GLUCOSE 182* 140* 89  BUN 134* 139* 146*  CREATININE 5.60* 5.64* 5.54*  CALCIUM 7.8* 7.7* 7.6*  GFRNONAA 9* 9* 9*  GFRAA 11* 11* 11*  ANIONGAP 13 11 13     Recent Labs  Lab 05/10/19 1924 05/12/19 1434 05/13/19 1153 05/14/19 0643  PROT 6.1* 5.8*  --   --   ALBUMIN 3.2* 3.0* 2.8* 2.6*  AST 18 13*  --   --   ALT 19 15  --   --   ALKPHOS 52 50  --   --   BILITOT 0.5 0.4  --   --    Hematology Recent Labs  Lab 05/12/19 1434 05/13/19 1153 05/13/19 1707 05/14/19 0643  WBC 12.5* 10.9*  --  9.6  RBC 2.37* 2.31* 2.64* 2.36*  HGB 6.9* 6.8* 7.9* 7.0*  HCT 21.7* 20.9* 23.8* 21.6*  MCV 91.6 90.5  --  91.5  MCH 29.1 29.4  --  29.7  MCHC 31.8 32.5  --  32.4  RDW 15.0 15.0  --  15.1  PLT 181 170  --  177   Cardiac EnzymesNo results for input(s): TROPONINI in the last 168 hours. No results for input(s): TROPIPOC in the last 168 hours.  BNP Recent Labs  Lab 05/10/19 1924 05/12/19 1832  BNP 1,144.8* 1,225.2*    DDimer No results for input(s): DDIMER in the last 168 hours.  Radiology/Studies:  Dg Chest 2 View  Result Date: 05/13/2019 CLINICAL DATA:  Hypoxemia EXAM: CHEST - 2 VIEW COMPARISON:  05/12/2019 FINDINGS: There is a dense, masslike airspace opacity of the perihilar right lung which is more apparent than on prior examinations, with probable small layering pleural effusions. There is underlying diffuse interstitial pulmonary opacity, likely edema. Cardiomegaly. IMPRESSION: There is a dense, masslike airspace opacity of the perihilar right lung which is more apparent than on prior  examinations, with probable small layering pleural effusions. There is underlying diffuse interstitial pulmonary opacity, likely edema. Cardiomegaly. Electronically Signed   By: Eddie Candle M.D.   On: 05/13/2019 13:06   Dg Chest Port 1 View  Result Date: 05/12/2019 CLINICAL DATA:  Cough EXAM: PORTABLE CHEST 1 VIEW COMPARISON:  May 10, 2019 FINDINGS: There is cardiomegaly with pulmonary vascularity within normal limits. There are small pleural effusions bilaterally. There is consolidation in each lung base. Lungs elsewhere clear. No adenopathy. No bone lesions. IMPRESSION: Bibasilar airspace consolidation, most likely representing multifocal pneumonia. Small pleural effusions bilaterally. No evident interstitial edema. There is cardiomegaly. Electronically Signed   By: Lowella Grip III M.D.   On: 05/12/2019 16:12   Dg Chest Portable 1 View  Result Date: 05/10/2019 CLINICAL DATA:  Dizziness. EXAM: PORTABLE CHEST 1 VIEW COMPARISON:  None. FINDINGS: 1919 hours. Low lung volumes. Interstitial markings are diffusely coarsened with chronic features. Bibasilar patchy airspace disease suggests atelectasis or infiltrate. No substantial pleural effusion. Cardiopericardial silhouette is at upper limits of normal for size. Bones are diffusely demineralized. Telemetry leads overlie the chest. IMPRESSION: Low lung volumes with bibasilar atelectasis or infiltrate. Electronically Signed   By: Misty Stanley M.D.   On: 05/10/2019 20:09    Assessment and Plan:   1. Acute blood loss anemia superimposed on chronic anemia with UGI bleed - EGD findings above with recommendation for PPI. Hgb downtrended again by last check. Will need to show clinical stability before ischemic workup pursued. He has not yet been re-tried on antiplatelet agents. Needs further f/u of declining blood count.  2. NSTEMI - unclear if demand process from profound anemia in context of respiratory failure/volume overload/aortic stenosis or if he  has underlying CAD. The primary issue did not appear to be ACS driving his admission although his EKG does appear concerning for underlying coronary disease. He is not ideal candidate for proceeding with cath given ongoing issues with anemia and the fact that he is not yet on dialysis. Would like to see stability of anemia before pursuing diagnostic workup. Will review further with Dr. Burt Knack. Of note, LDL 32, changed from pravastatin to atorvastatin this admission by IM. If the patient is tolerating statin at time of follow-up appointment, would consider rechecking liver function/lipid panel in 6-8 weeks. Beta blocker dose was reduced on admission due to hypotension.  3. Acute on chronic volume overload suspected due to combination of diastolic CHF in setting of end-stage renal disease - nephrology managing diuretics. Suspect nearing HD regardless of whether or not cath is pursued.  4. Atrial fibrillation - newly recognized this admission, spontaneously converted to NSR. CHADSVASC at least 4 for CHF, HTN,  age, diabetes and possibly 5 if he is found to have vascular disease. Poor candidate for initiation of NOAC at this time given ARF and acute anemia. Check TSH with AM labs. Pending review with MD.  5. Moderate AS - likely contributing to symptoms confounded by anemia. Will need close clinical f/u.  6. HTN - home amlodipine on hold, carvedilol decreased from 25->6.25 given hypotension this admission.  7. Pulmonary HTN - likely in setting of OSA, obesity, volume overload. Can follow clinically and consider repeat echo when consistently euvolemic.  For questions or updates, please contact [place] Please consult [email or web address] for contact info under Cardiology/STEMI.    Signed, Charlie Pitter, PA-C  05/14/2019 1:45 PM   Patient seen, examined. Available data reviewed. Agree with findings, assessment, and plan as outlined by Melina Copa, PA-C.  The patient is independently interviewed and  examined.  On my exam, he is an alert, oriented, obese male in no distress.  JVP is moderately elevated.  Carotid upstrokes are normal with bilateral bruits, lung fields are diminished in the bases but otherwise clear, heart is regular rate and rhythm with a grade 3/6 harsh mid to late peaking systolic murmur with A2 present but diminished.  There is no diastolic murmur.  Abdomen is soft, obese, nontender.  Extremities show trace pretibial edema.  Skin is warm dry without rash.  Neurologic is grossly intact with strength 5/5 in the arms and legs bilaterally.  The patient's EKG from 05/13/2019 shows atrial fibrillation with controlled ventricular rate, marked ST segment change suggestive of anterolateral ischemia.  The patient presented last week with symptomatic anemia and GI bleed.  His hemoglobin had dropped from 11.1 on April 20, 2019 down to 5.7 on May 10, 2019.  Endoscopic evaluation yesterday demonstrated gastritis and duodenitis.  Hemoglobin has stabilized around 7 mg/dL.  In addition, the patient has worsening of stage IV chronic kidney disease and is now approaching the need for dialysis.  Nephrology is following him during his hospitalization.  I personally reviewed the patient's echo images which demonstrate Mildly reduced LV systolic function with LVEF 50% and segmental wall motion abnormalities noted.  There is at least moderate aortic stenosis with a mean transvalvular gradient of 33 mmHg, with calcification and restriction of the aortic valve leaflets.  The patient has a high likelihood of obstructive coronary artery disease with his EKG changes, elevated troponin, and wall motion abnormalities on echo.  He is having no anginal chest pain.  Considering his complicated medical picture, inability to anticoagulate him or use antiplatelet therapy with recent GI bleed, and impending need for hemodialysis, I would not recommend invasive cardiac studies at this time as I do not think he is stable enough to  undergo coronary or valve intervention.  I would recommend outpatient cardiology follow-up, continued close surveillance, and consideration of diagnostic cardiac catheterization once he demonstrates stable hemoglobin and once we have a clear decision regarding dialysis and can assess his tolerance of this.  Discussed with the patient at length who agrees with this plan.  I will contact his wife as well.  Sherren Mocha, M.D. 05/14/2019 3:08 PM

## 2019-05-14 NOTE — Progress Notes (Signed)
[facility] NEPHROLOGY PROGRESS NOTE  Assessment/ Plan: Pt is a 73 y.o. yo male with history of hypertension, OSA, recent hospitalization for GI bleeding/NSTEMI presented with SOB and melena.  In ER patient was hypotensive to 80s, hemoglobin 6.9 and creatinine level of 5.6.  We are consulted for CKD management.  # CKD stage IV/V: Acute rise in creatinine probably due to hypotensive episode in ER and anemia.  For CKD he follows with Dr. Royce Macadamia at [facility] and already had upper extremity pain mapping done.  He received IV Lasix 80 mg twice yesterday with urine output of 1.8 L.  Has some lower extremity edema therefore another dose of Lasix today.  Creatinine remains stable around 5.5 however BUN has gone up.  He has no overt uremic symptoms.  I have discussed about need for dialysis while he is in the hospital.  He has not decided about dialysis yet and wanted to wait 1-2 days.  Repeat lab in the morning.  #Hypertension/volume: Lasix 80 mg today.  Continue Coreg.  Monitor blood pressure.  #Anemia due to GI bleed and CKD: Received 2 units of blood transfusion.  Hemoglobin is still low at 7 today.  Iron saturation 9 therefore I will order a dose of Feraheme and ESA today.  #Metabolic bone disease: Check PTH level.  Phosphorus elevated.  Start PhosLo.  Continue calcitriol.  #Metabolic acidosis.  Increase the dose of sodium bicarbonate.  #Respiratory failure possible HCAP: On cefepime per primary team.  #CHF/pulmonary hypertension: Diuretics as above.  Recommend cardiology consult.  Subjective: Seen and examined at bedside.  He reported feeling good.  Denied headache, dizziness, chest pain or shortness of breath.  No urinary complaint. Objective Vital signs in last 24 hours: Vitals:   05/13/19 1958 05/13/19 2000 05/13/19 2241 05/14/19 0553  BP: (!) 120/53   126/66  Pulse: (!) 57 (!) 59 (!) 54 61  Resp: 20 (!) 22 18 20   Temp: (!) 97.4 F (36.3 C)   97.8 F (36.6 C)   TempSrc: Oral   Axillary  SpO2: 93% 92% 95% 97%  Weight:    135.2 kg  Height:       Weight change: -0.907 kg  Intake/Output Summary (Last 24 hours) at 05/14/2019 1246 Last data filed at 05/14/2019 0500 Gross per 24 hour  Intake 920.33 ml  Output 1625 ml  Net -704.67 ml       Labs: Basic Metabolic Panel: Recent Labs  Lab 05/12/19 1434 05/13/19 1153 05/14/19 0643  NA 132* 136 139  K 4.7 4.7 4.4  CL 101 106 107  CO2 18* 19* 19*  GLUCOSE 182* 140* 89  BUN 134* 139* 146*  CREATININE 5.60* 5.64* 5.54*  CALCIUM 7.8* 7.7* 7.6*  PHOS  --  6.0* 6.9*   Liver Function Tests: Recent Labs  Lab 05/10/19 1924 05/12/19 1434 05/13/19 1153 05/14/19 0643  AST 18 13*  --   --   ALT 19 15  --   --   ALKPHOS 52 50  --   --   BILITOT 0.5 0.4  --   --   PROT 6.1* 5.8*  --   --   ALBUMIN 3.2* 3.0* 2.8* 2.6*   No results for input(s): LIPASE, AMYLASE in the last 168 hours. No results for input(s): AMMONIA in the last 168 hours. CBC: Recent Labs  Lab 05/10/19 1827 05/11/19 0935 05/12/19 1434 05/13/19 1153 05/13/19 1707 05/14/19 0643  WBC 14.3* 13.7* 12.5* 10.9*  --  9.6  NEUTROABS  --   --  11.3*  --   --  7.8*  HGB 5.7* 6.8* 6.9* 6.8* 7.9* 7.0*  HCT 18.5* 20.9* 21.7* 20.9* 23.8* 21.6*  MCV 93.0 90.1 91.6 90.5  --  91.5  PLT 215 202 181 170  --  177   Cardiac Enzymes: No results for input(s): CKTOTAL, CKMB, CKMBINDEX, TROPONINI in the last 168 hours. CBG: Recent Labs  Lab 05/13/19 1158 05/13/19 1636 05/13/19 2058 05/14/19 0736 05/14/19 1204  GLUCAP 131* 129* 108* 93 171*    Iron Studies:  Recent Labs    05/13/19 1707  IRON 30*  TIBC 318  FERRITIN 205   Studies/Results: Dg Chest 2 View  Result Date: 05/13/2019 CLINICAL DATA:  Hypoxemia EXAM: CHEST - 2 VIEW COMPARISON:  05/12/2019 FINDINGS: There is a dense, masslike airspace opacity of the perihilar right lung which is more apparent than on prior examinations, with probable small layering pleural  effusions. There is underlying diffuse interstitial pulmonary opacity, likely edema. Cardiomegaly. IMPRESSION: There is a dense, masslike airspace opacity of the perihilar right lung which is more apparent than on prior examinations, with probable small layering pleural effusions. There is underlying diffuse interstitial pulmonary opacity, likely edema. Cardiomegaly. Electronically Signed   By: Eddie Candle M.D.   On: 05/13/2019 13:06   Dg Chest Port 1 View  Result Date: 05/12/2019 CLINICAL DATA:  Cough EXAM: PORTABLE CHEST 1 VIEW COMPARISON:  May 10, 2019 FINDINGS: There is cardiomegaly with pulmonary vascularity within normal limits. There are small pleural effusions bilaterally. There is consolidation in each lung base. Lungs elsewhere clear. No adenopathy. No bone lesions. IMPRESSION: Bibasilar airspace consolidation, most likely representing multifocal pneumonia. Small pleural effusions bilaterally. No evident interstitial edema. There is cardiomegaly. Electronically Signed   By: Lowella Grip III M.D.   On: 05/12/2019 16:12    Medications: Infusions: . sodium chloride    . ceFEPime (MAXIPIME) IV 2 g (05/13/19 1700)  . doxycycline (VIBRAMYCIN) IV 100 mg (05/14/19 0522)    Scheduled Medications: . sodium chloride   Intravenous Once  . atorvastatin  20 mg Oral q1800  . calcitRIOL  0.25 mcg Oral Q M,W,F  . carvedilol  6.25 mg Oral BID WC  . guaiFENesin  600 mg Oral BID  . insulin aspart  0-9 Units Subcutaneous TID WC  . pantoprazole (PROTONIX) IV  40 mg Intravenous Q12H  . sodium bicarbonate  650 mg Oral Daily  . sodium chloride flush  3 mL Intravenous Q12H  . sodium zirconium cyclosilicate  10 g Oral Once per day on Mon Wed Fri    have reviewed scheduled and prn medications.  Physical Exam: General:NAD, comfortable Heart:RRR, s1s2 nl, no rubs Lungs: Basal crackle, no wheezing Abdomen:soft, Non-tender, non-distended Extremities: Lower extremity edema + Neurology: Alert,  awake, following commands.  Louis Bradley 05/14/2019,12:46 PM  LOS: 2 days  Pager: [phone]

## 2019-05-14 NOTE — Progress Notes (Addendum)
[facility] Gastroenterology Progress Note  Macedonio Scallon Klausner 73 y.o. 03-May-1946  CC: Anemia, heme positive stool   Subjective: Patient resting comfortably in the bed with use of CPAP.  Denies any further bleeding episodes.  Denies abdominal pain, nausea and vomiting.  ROS : neg  for chest pain and shortness of breath.   Objective: Vital signs in last 24 hours: Vitals:   05/13/19 2241 05/14/19 0553  BP:  126/66  Pulse: (!) 54 61  Resp: 18 20  Temp:  97.8 F (36.6 C)  SpO2: 95% 97%    Physical Exam:  General.  Resting comfortably.  CPAP currently in use. Heart : RRR, no murmur Chest : CTAb, anterior exam only Abdomen.  Obese abdomen but otherwise soft, nondistended, bowel sounds present.  Nontender and no peritoneal signs.   Lab Results: Recent Labs    05/13/19 1153 05/14/19 0643  NA 136 139  K 4.7 4.4  CL 106 107  CO2 19* 19*  GLUCOSE 140* 89  BUN 139* 146*  CREATININE 5.64* 5.54*  CALCIUM 7.7* 7.6*  MG 3.2* 3.0*  PHOS 6.0* 6.9*   Recent Labs    05/12/19 1434 05/13/19 1153 05/14/19 0643  AST 13*  --   --   ALT 15  --   --   ALKPHOS 50  --   --   BILITOT 0.4  --   --   PROT 5.8*  --   --   ALBUMIN 3.0* 2.8* 2.6*   Recent Labs    05/12/19 1434 05/13/19 1153 05/13/19 1707 05/14/19 0643  WBC 12.5* 10.9*  --  9.6  NEUTROABS 11.3*  --   --  7.8*  HGB 6.9* 6.8* 7.9* 7.0*  HCT 21.7* 20.9* 23.8* 21.6*  MCV 91.6 90.5  --  91.5  PLT 181 170  --  177   No results for input(s): LABPROT, INR in the last 72 hours.    Assessment/Plan: -Anemia with occult blood positive stool.  Reported history of duodenal ulcer.  EGD yesterday showed gastritis and duodenitis. -Personal history of colon polyps.  Last colonoscopy 6 to 8 months ago in [place] according to patient.  Report not available to review. -NSTEMI.  Patient declined cardiac evaluation.  Recommendations ------------------------- -No further inpatient GI work-up planned. -Recommend once a day PPI for at least  8 weeks. -Follow-up with primary GI Dr. Cristina Gong in 6 to 8 weeks after discharge. -GI will sign off.  Call us back if needed   Otis Brace MD, [place] 05/14/2019, 9:39 AM  Contact #  [phone]

## 2019-05-14 NOTE — Progress Notes (Signed)
PROGRESS NOTE  Louis Bradley EQA:[id number] DOB: February 18, 1946 DOA: 05/12/2019 PCP: Colon Branch, MD   LOS: 2 days   Patient is from: Home  Brief Narrative / Interim history: 73 year old male with history of HTN, CKD-4, OSA on CPAP, recent hospitalization from 6/25-6/26 for GIB/NSTEMI presenting with generalized weakness, shortness of breath, weight cough and melena. Reportedly declined EGD and LHC at his recent hospitalization for GIB and end STEMI and went home.  In ED, low BP to 88/49 that is improved with IV fluids.  Mild low temperature to 96.  WBC 12.5.  Hgb 6.9 (6.8 on 6/26 prior to discharge although received 1 unit after that).  Sodium 132.  BUN 134.  Creatinine 5.6.  Troponin 2283.  BNP 1225 (about the same on 6/25).  Lactic acid 0.8.  Urinalysis suggestive for UTI.  CXR concerning for multifocal airspace opacities concerning for pneumonia.  FOBT positive. Patient was started on vancomycin and cefepime empirically for pneumonia and UTI.  One unit blood ordered and admitted for further care.  Subjective: No major events overnight of this morning.  Spend the night on CPAP.  Denies chest pain, dyspnea, palpitation or lightheadedness.  Feels tired.  Denies GI or GU symptoms.  Reconsidering dialysis.  Also likes to discuss with cardiology.  Objective: Vitals:   05/13/19 1958 05/13/19 2000 05/13/19 2241 05/14/19 0553  BP: (!) 120/53   126/66  Pulse: (!) 57 (!) 59 (!) 54 61  Resp: 20 (!) 22 18 20   Temp: (!) 97.4 F (36.3 C)   97.8 F (36.6 C)  TempSrc: Oral   Axillary  SpO2: 93% 92% 95% 97%  Weight:    135.2 kg  Height:        Intake/Output Summary (Last 24 hours) at 05/14/2019 1214 Last data filed at 05/14/2019 0500 Gross per 24 hour  Intake 920.33 ml  Output 1625 ml  Net -704.67 ml   Filed Weights   05/13/19 0403 05/13/19 1425 05/14/19 0553  Weight: 133.8 kg 133.8 kg 135.2 kg    Examination:  GENERAL: No acute distress.  Appears well.  HEENT: MMM.  Vision and hearing  grossly intact.  On BiPAP. NECK: Supple.  Mild JVD. LUNGS:  No IWOB.  Diminished aeration bilaterally.  Fine crackles bilaterally. HEART:  RRR.  2/6 SEM over LUSB and LLSB. ABD: Bowel sounds present. Soft. Non tender.  MSK/EXT:  Moves all extremities. No apparent deformity. No edema bilaterally.  SKIN: no apparent skin lesion or wound NEURO: Sleepy but arises easily and responds to question appropriately.  No focal neuro deficits. PSYCH: Calm. Normal affect.  I have personally reviewed the following labs and images: CBC: Recent Labs  Lab 05/10/19 1827 05/11/19 0935 05/12/19 1434 05/13/19 1153 05/13/19 1707 05/14/19 0643  WBC 14.3* 13.7* 12.5* 10.9*  --  9.6  NEUTROABS  --   --  11.3*  --   --  7.8*  HGB 5.7* 6.8* 6.9* 6.8* 7.9* 7.0*  HCT 18.5* 20.9* 21.7* 20.9* 23.8* 21.6*  MCV 93.0 90.1 91.6 90.5  --  91.5  PLT 215 202 181 170  --  979   Basic Metabolic Panel: Recent Labs  Lab 05/10/19 1827 05/10/19 1924 05/11/19 0935 05/12/19 1434 05/13/19 1153 05/14/19 0643  NA 135  --  137 132* 136 139  K 5.2*  --  4.6 4.7 4.7 4.4  CL 105  --  106 101 106 107  CO2 22  --  20* 18* 19* 19*  GLUCOSE 176*  --  139* 182* 140* 89  BUN 115*  --  119* 134* 139* 146*  CREATININE 5.24*  --  5.29* 5.60* 5.64* 5.54*  CALCIUM 8.1*  --  8.1* 7.8* 7.7* 7.6*  MG  --  3.0* 2.9*  --  3.2* 3.0*  PHOS  --   --   --   --  6.0* 6.9*   GFR: Estimated Creatinine Clearance: 17.6 mL/min (A) (by C-G formula based on SCr of 5.54 mg/dL (H)). Liver Function Tests: Recent Labs  Lab 05/10/19 1924 05/12/19 1434 05/13/19 1153 05/14/19 0643  AST 18 13*  --   --   ALT 19 15  --   --   ALKPHOS 52 50  --   --   BILITOT 0.5 0.4  --   --   PROT 6.1* 5.8*  --   --   ALBUMIN 3.2* 3.0* 2.8* 2.6*   No results for input(s): LIPASE, AMYLASE in the last 168 hours. No results for input(s): AMMONIA in the last 168 hours. Coagulation Profile: Recent Labs  Lab 05/10/19 1924  INR 1.1   Cardiac Enzymes: No  results for input(s): CKTOTAL, CKMB, CKMBINDEX, TROPONINI in the last 168 hours. BNP (last 3 results) No results for input(s): PROBNP in the last 8760 hours. HbA1C: No results for input(s): HGBA1C in the last 72 hours. CBG: Recent Labs  Lab 05/13/19 1158 05/13/19 1636 05/13/19 2058 05/14/19 0736 05/14/19 1204  GLUCAP 131* 129* 108* 93 171*   Lipid Profile: No results for input(s): CHOL, HDL, LDLCALC, TRIG, CHOLHDL, LDLDIRECT in the last 72 hours. Thyroid Function Tests: No results for input(s): TSH, T4TOTAL, FREET4, T3FREE, THYROIDAB in the last 72 hours. Anemia Panel: Recent Labs    05/13/19 1704 05/13/19 1707  VITAMINB12  --  442  FOLATE 29.7  --   FERRITIN  --  205  TIBC  --  318  IRON  --  30*  RETICCTPCT  --  4.8*   Urine analysis:    Component Value Date/Time   COLORURINE YELLOW 05/12/2019 1600   APPEARANCEUR HAZY (A) 05/12/2019 1600   LABSPEC 1.013 05/12/2019 1600   PHURINE 5.0 05/12/2019 1600   GLUCOSEU 50 (A) 05/12/2019 1600   HGBUR SMALL (A) 05/12/2019 1600   BILIRUBINUR NEGATIVE 05/12/2019 1600   KETONESUR NEGATIVE 05/12/2019 1600   PROTEINUR 100 (A) 05/12/2019 1600   NITRITE NEGATIVE 05/12/2019 1600   LEUKOCYTESUR LARGE (A) 05/12/2019 1600   Sepsis Labs: Invalid input(s): PROCALCITONIN, LACTICIDVEN  Recent Results (from the past 240 hour(s))  Novel Coronavirus,NAA,(SEND-OUT TO REF LAB - TAT 24-48 hrs); Hosp Order     Status: None   Collection Time: 05/10/19  7:39 PM   Specimen: Nasopharyngeal Swab; Respiratory  Result Value Ref Range Status   SARS-CoV-2, NAA NOT DETECTED NOT DETECTED Final    Comment: (NOTE) This test was developed and its performance characteristics determined by [employer]. This test has not been FDA cleared or approved. This test has been authorized by FDA under an Emergency Use Authorization (EUA). This test is only authorized for the duration of time the declaration that circumstances exist justifying the  authorization of the emergency use of in vitro diagnostic tests for detection of SARS-CoV-2 virus and/or diagnosis of COVID-19 infection under section 564(b)(1) of the Act, 21 U.S.C. 295AOZ-3(Y)(8), unless the authorization is terminated or revoked sooner. When diagnostic testing is negative, the possibility of a false negative result should be considered in the context of a patient's recent exposures and the presence of clinical  signs and symptoms consistent with COVID-19. An individual without symptoms of COVID-19 and who is not shedding SARS-CoV-2 virus would expect to have a negative (not detected) result in this assay. Performed  At: [facility] [address] [place], [place] [id number] Rush Farmer MD GY:[id number]    [place]  Final    Comment: Performed at [facility], [place] [address], [place], [place] [address]  SARS Coronavirus 2 (CEPHEID- Performed in [place] hospital lab), Hosp Order     Status: None   Collection Time: 05/12/19  2:34 PM   Specimen: Nasopharyngeal Swab  Result Value Ref Range Status   SARS Coronavirus 2 NEGATIVE NEGATIVE Final    Comment: (NOTE) If result is NEGATIVE SARS-CoV-2 target nucleic acids are NOT DETECTED. The SARS-CoV-2 RNA is generally detectable in upper and lower  respiratory specimens during the acute phase of infection. The lowest  concentration of SARS-CoV-2 viral copies this assay can detect is 250  copies / mL. A negative result does not preclude SARS-CoV-2 infection  and should not be used as the sole basis for treatment or other  patient management decisions.  A negative result may occur with  improper specimen collection / handling, submission of specimen other  than nasopharyngeal swab, presence of viral mutation(s) within the  areas targeted by this assay, and inadequate number of viral copies  (<250 copies / mL). A negative result must be combined with clinical  observations, patient  history, and epidemiological information. If result is POSITIVE SARS-CoV-2 target nucleic acids are DETECTED. The SARS-CoV-2 RNA is generally detectable in upper and lower  respiratory specimens dur ing the acute phase of infection.  Positive  results are indicative of active infection with SARS-CoV-2.  Clinical  correlation with patient history and other diagnostic information is  necessary to determine patient infection status.  Positive results do  not rule out bacterial infection or co-infection with other viruses. If result is PRESUMPTIVE POSTIVE SARS-CoV-2 nucleic acids MAY BE PRESENT.   A presumptive positive result was obtained on the submitted specimen  and confirmed on repeat testing.  While 2019 novel coronavirus  (SARS-CoV-2) nucleic acids may be present in the submitted sample  additional confirmatory testing may be necessary for epidemiological  and / or clinical management purposes  to differentiate between  SARS-CoV-2 and other Sarbecovirus currently known to infect humans.  If clinically indicated additional testing with an alternate test  methodology [id number]) is advised. The SARS-CoV-2 RNA is generally  detectable in upper and lower respiratory sp ecimens during the acute  phase of infection. The expected result is Negative. Fact Sheet for Patients:  StrictlyIdeas.no Fact Sheet for Healthcare Providers: BankingDealers.co.za This test is not yet approved or cleared by the [place] FDA and has been authorized for detection and/or diagnosis of SARS-CoV-2 by FDA under an Emergency Use Authorization (EUA).  This EUA will remain in effect (meaning this test can be used) for the duration of the COVID-19 declaration under Section 564(b)(1) of the Act, 21 U.S.C. section 360bbb-3(b)(1), unless the authorization is terminated or revoked sooner. Performed at [facility], [place] [address] [place], [place] [address]    Blood Culture (routine x 2)     Status: None (Preliminary result)   Collection Time: 05/12/19  2:45 PM   Specimen: BLOOD  Result Value Ref Range Status   Specimen Description BLOOD RIGHT ANTECUBITAL  Final   Special Requests   Final    BOTTLES DRAWN AEROBIC AND ANAEROBIC  Blood Culture results may not be optimal due to an excessive volume of blood received in culture bottles   Culture   Final    NO GROWTH 2 DAYS Performed at [place] [address] [place], [place] [address]    Report Status PENDING  Incomplete  Blood Culture (routine x 2)     Status: None (Preliminary result)   Collection Time: 05/12/19  3:09 PM   Specimen: BLOOD  Result Value Ref Range Status   Specimen Description BLOOD LEFT ANTECUBITAL  Final   Special Requests   Final    BOTTLES DRAWN AEROBIC AND ANAEROBIC Blood Culture adequate volume   Culture   Final    NO GROWTH 2 DAYS Performed at [facility], [place] [address], [place], [place] [address]    Report Status PENDING  Incomplete  Urine Culture     Status: Abnormal   Collection Time: 05/12/19  4:00 PM   Specimen: Urine, Random  Result Value Ref Range Status   Specimen Description URINE, RANDOM  Final   Special Requests   Final    NONE Performed at [facility], [place] [address] [place], [place] [address]    Culture MULTIPLE SPECIES PRESENT, SUGGEST RECOLLECTION (A)  Final   Report Status 05/14/2019 FINAL  Final     Radiology Studies: Dg Chest 2 View  Result Date: 05/13/2019 CLINICAL DATA:  Hypoxemia EXAM: CHEST - 2 VIEW COMPARISON:  05/12/2019 FINDINGS: There is a dense, masslike airspace opacity of the perihilar right lung which is more apparent than on prior examinations, with probable small layering pleural effusions. There is underlying diffuse interstitial pulmonary opacity, likely edema. Cardiomegaly. IMPRESSION: There is a dense, masslike airspace opacity of the perihilar right lung which is more apparent than on prior  examinations, with probable small layering pleural effusions. There is underlying diffuse interstitial pulmonary opacity, likely edema. Cardiomegaly. Electronically Signed   By: Eddie Candle M.D.   On: 05/13/2019 13:06     Procedures:  None  6/26: Echo: EF of 26%, diastolic dysfunction, hypokinesis of apical anterior wall, apical septal wall and lateral apex.  Moderate LAE.  Moderate AS.  PASP 53 mmHg.   Microbiology: 6/27: Blood cultures no growth so far 6/27: COVID-19 screen negative 6/27: Urine culture multiple species. 6/27: Sputum culture pending  Assessment & Plan: Acute respiratory failure: Multifactorial including acute on chronic CHF, non-STEMI, anemia and possible pneumonia.  Chest x-ray reviewed by me favor CHF versus pneumonia.  -Treat treatable causes as above -Wean oxygen as able  Acute on chronic combined CHF/moderate pulmonary hypertension: patient came in with dyspnea.  Bibasilar crackles on exam.  Difficult to assess JVD. Trace edema bilaterally.  BNP 1225 (1114 two days prior). CXR concerning for CHF. -Lasix per nephrology -Daily weight, intake output and renal functions -Salt and fluid restrictions -Closely monitor electrolytes and replenish aggressively  Symptomatic anemia due to GI bleed: Had melanotic stool on admission. -Had EGD on 6/28 that revealed gastritis and duodenitis but no active bleed. -Hemoglobin 6.9->1u->7.9->7.0->1u -Monitor H&H -Continue PPI -GI rec-signed off  Multifocal pneumonia?:  CXR favors fluid versus infectious process. Mild low temp to 96 F and mild leukocytosis.  Lactic acid negative.  Cultures negative so far.  COVID-19 screen negative.  Procalcitonin marginal. -Discontinue vancomycin -We will discontinue cefepime if repeat procalcitonin not elevated. -Follow sputum culture  Pyuria versus UTI: Patient without UTI symptoms.  Urinalysis with large LE -Continue cefepime for questionable pneumonia.  Non-STEMI: Troponin elevated  to  2200.  EKG with some T wave inversions which seems to be old.  -No anticoagulation due to GI bleed -Denied catheterization last admission and this admission.  Now open to discussion with cardiology. -Resume carvedilol at a lower dose. -Atorvastatin.  CKD-5/azotemia: Creatinine 5.6 (5.29 when discharged on 6/26).  Recent values range from 4.5-5.5.  134 (119 on 6/26).  Azotemia could be due to GI bleed and renal failure. -Nephrology involved.  Well-controlled NIDDM-2 with renal complication and C5 0.9 on 6/26.  On glipizide at home. -SSI-renal -Statin as above  OSA on CPAP:  -nightly CPAP  DVT prophylaxis: SCD due to GI bleed Code Status: Full code Family Communication: updated his wife and daughter Disposition Plan: Remains inpatient for CHF exacerbation, GI bleed and end-stage Consultants: Nephrology,  GI and nephrology  Antimicrobials: Anti-infectives (From admission, onward)   Start     Dose/Rate Route Frequency Ordered Stop   05/14/19 1800  vancomycin (VANCOCIN) IVPB 1000 mg/200 mL premix  Status:  Discontinued     1,000 mg 200 mL/hr over 60 Minutes Intravenous Every 48 hours 05/12/19 1633 05/13/19 1119   05/13/19 1800  vancomycin (VANCOCIN) IVPB 1000 mg/200 mL premix  Status:  Discontinued     1,000 mg 200 mL/hr over 60 Minutes Intravenous Every 48 hours 05/12/19 1632 05/12/19 1633   05/13/19 1700  ceFEPIme (MAXIPIME) 2 g in sodium chloride 0.9 % 100 mL IVPB     2 g 200 mL/hr over 30 Minutes Intravenous Every 24 hours 05/12/19 1632     05/13/19 1700  doxycycline (VIBRAMYCIN) 100 mg in sodium chloride 0.9 % 250 mL IVPB     100 mg 125 mL/hr over 120 Minutes Intravenous Every 12 hours 05/13/19 1654     05/12/19 1630  vancomycin (VANCOCIN) 2,500 mg in sodium chloride 0.9 % 500 mL IVPB     2,500 mg 250 mL/hr over 120 Minutes Intravenous  Once 05/12/19 1626 05/12/19 2030   05/12/19 1615  ceFEPIme (MAXIPIME) 2 g in sodium chloride 0.9 % 100 mL IVPB     2 g 200 mL/hr over 30  Minutes Intravenous  Once 05/12/19 1604 05/12/19 1732      Scheduled Meds: . sodium chloride   Intravenous Once  . calcitRIOL  0.25 mcg Oral Q M,W,F  . guaiFENesin  600 mg Oral BID  . insulin aspart  0-9 Units Subcutaneous TID WC  . pantoprazole (PROTONIX) IV  40 mg Intravenous Q12H  . pravastatin  40 mg Oral QHS  . sodium bicarbonate  650 mg Oral Daily  . sodium chloride flush  3 mL Intravenous Q12H  . sodium zirconium cyclosilicate  10 g Oral Once per day on Mon Wed Fri   Continuous Infusions: . sodium chloride    . ceFEPime (MAXIPIME) IV 2 g (05/13/19 1700)  . doxycycline (VIBRAMYCIN) IV 100 mg (05/14/19 0522)   PRN Meds:.acetaminophen **OR** acetaminophen, albuterol, nicotine polacrilex   T. [place]  If 7PM-7AM, please contact night-coverage [email or web address] Password [facility] 05/14/2019, 12:14 PM

## 2019-05-15 ENCOUNTER — Ambulatory Visit: Payer: Medicare Other | Admitting: Orthopaedic Surgery

## 2019-05-15 ENCOUNTER — Inpatient Hospital Stay (HOSPITAL_COMMUNITY): Payer: Medicare Other

## 2019-05-15 ENCOUNTER — Encounter: Payer: Self-pay | Admitting: Cardiovascular Disease

## 2019-05-15 ENCOUNTER — Telehealth: Payer: Self-pay | Admitting: Cardiology

## 2019-05-15 ENCOUNTER — Encounter (HOSPITAL_COMMUNITY): Payer: Self-pay | Admitting: Physician Assistant

## 2019-05-15 DIAGNOSIS — W19XXXD Unspecified fall, subsequent encounter: Secondary | ICD-10-CM

## 2019-05-15 DIAGNOSIS — I509 Heart failure, unspecified: Secondary | ICD-10-CM

## 2019-05-15 DIAGNOSIS — Y92009 Unspecified place in unspecified non-institutional (private) residence as the place of occurrence of the external cause: Secondary | ICD-10-CM

## 2019-05-15 DIAGNOSIS — Q6211 Congenital occlusion of ureteropelvic junction: Secondary | ICD-10-CM

## 2019-05-15 HISTORY — PX: IR THORACENTESIS ASP PLEURAL SPACE W/IMG GUIDE: IMG5380

## 2019-05-15 LAB — HEMOGLOBIN AND HEMATOCRIT, BLOOD
HCT: 25.9 % — ABNORMAL LOW (ref 39.0–52.0)
Hemoglobin: 8.6 g/dL — ABNORMAL LOW (ref 13.0–17.0)

## 2019-05-15 LAB — BODY FLUID CELL COUNT WITH DIFFERENTIAL
Lymphs, Fluid: 8 %
Monocyte-Macrophage-Serous Fluid: 52 % (ref 50–90)
Neutrophil Count, Fluid: 40 % — ABNORMAL HIGH (ref 0–25)
Total Nucleated Cell Count, Fluid: 243 cu mm (ref 0–1000)

## 2019-05-15 LAB — GLUCOSE, CAPILLARY
Glucose-Capillary: 112 mg/dL — ABNORMAL HIGH (ref 70–99)
Glucose-Capillary: 132 mg/dL — ABNORMAL HIGH (ref 70–99)
Glucose-Capillary: 157 mg/dL — ABNORMAL HIGH (ref 70–99)
Glucose-Capillary: 104 mg/dL — ABNORMAL HIGH (ref 70–99)

## 2019-05-15 LAB — CBC
HCT: 23.1 % — ABNORMAL LOW (ref 39.0–52.0)
Hemoglobin: 7.7 g/dL — ABNORMAL LOW (ref 13.0–17.0)
MCH: 29.7 pg (ref 26.0–34.0)
MCHC: 33.3 g/dL (ref 30.0–36.0)
MCV: 89.2 fL (ref 80.0–100.0)
Platelets: 178 10*3/uL (ref 150–400)
RBC: 2.59 MIL/uL — ABNORMAL LOW (ref 4.22–5.81)
RDW: 14.9 % (ref 11.5–15.5)
WBC: 9.2 10*3/uL (ref 4.0–10.5)
nRBC: 0 % (ref 0.0–0.2)

## 2019-05-15 LAB — RENAL FUNCTION PANEL
Albumin: 2.7 g/dL — ABNORMAL LOW (ref 3.5–5.0)
Anion gap: 11 (ref 5–15)
BUN: 146 mg/dL — ABNORMAL HIGH (ref 8–23)
CO2: 21 mmol/L — ABNORMAL LOW (ref 22–32)
Calcium: 8.1 mg/dL — ABNORMAL LOW (ref 8.9–10.3)
Chloride: 106 mmol/L (ref 98–111)
Creatinine, Ser: 5.38 mg/dL — ABNORMAL HIGH (ref 0.61–1.24)
GFR calc Af Amer: 11 mL/min — ABNORMAL LOW (ref >60)
GFR calc non Af Amer: 10 mL/min — ABNORMAL LOW (ref >60)
Glucose, Bld: 117 mg/dL — ABNORMAL HIGH (ref 70–99)
Phosphorus: 6.4 mg/dL — ABNORMAL HIGH (ref 2.5–4.6)
Potassium: 4.8 mmol/L (ref 3.5–5.1)
Sodium: 138 mmol/L (ref 135–145)

## 2019-05-15 LAB — GRAM STAIN

## 2019-05-15 LAB — PROTEIN, PLEURAL OR PERITONEAL FLUID: Total protein, fluid: <3 g/dL

## 2019-05-15 LAB — MAGNESIUM: Magnesium: 2.9 mg/dL — ABNORMAL HIGH (ref 1.7–2.4)

## 2019-05-15 LAB — TSH: TSH: 0.965 u[IU]/mL (ref 0.350–4.500)

## 2019-05-15 LAB — LACTATE DEHYDROGENASE, PLEURAL OR PERITONEAL FLUID: LD, Fluid: 50 U/L — ABNORMAL HIGH (ref 3–23)

## 2019-05-15 MED ORDER — FUROSEMIDE 80 MG PO TABS
80.0000 mg | ORAL_TABLET | Freq: Two times a day (BID) | ORAL | Status: DC
  Administered 2019-05-16 – 2019-05-17 (×3): 80 mg via ORAL
  Filled 2019-05-15 (×3): qty 1

## 2019-05-15 MED ORDER — FUROSEMIDE 10 MG/ML IJ SOLN
80.0000 mg | Freq: Once | INTRAMUSCULAR | Status: AC
  Administered 2019-05-15: 80 mg via INTRAVENOUS
  Filled 2019-05-15: qty 8

## 2019-05-15 MED ORDER — SODIUM CHLORIDE 0.9% IV SOLUTION
Freq: Once | INTRAVENOUS | Status: AC
  Administered 2019-05-15: 10:00:00 via INTRAVENOUS

## 2019-05-15 MED ORDER — LIDOCAINE HCL (PF) 1 % IJ SOLN
INTRAMUSCULAR | Status: DC | PRN
  Administered 2019-05-15: 10 mL

## 2019-05-15 MED ORDER — LIDOCAINE HCL 1 % IJ SOLN
INTRAMUSCULAR | Status: AC
  Filled 2019-05-15: qty 20

## 2019-05-15 NOTE — Progress Notes (Signed)
Pharmacy Antibiotic Note  Louis Bradley is a 73 y.o. male admitted on 05/12/2019 with sepsis.  Pharmacy has been consulted for cefepime dosing.   Currently on day 4 of antibiotics. WBC WNL, Scr 5.38, afebrile. LA 0.8. No growth on BCx. Sputum cx not collected.   Plan: Cefepime 2gm IV Q24H F/u renal fxn, C&S, clinical status and duration of total therapy   Height: 6\' 3"  (190.5 cm) Weight: 295 lb 6.7 oz (134 kg) IBW/kg (Calculated) : 84.5  Temp (24hrs), Avg:97.5 F (36.4 C), Min:97.4 F (36.3 C), Max:97.8 F (36.6 C)  Recent Labs  Lab 05/11/19 0935 05/12/19 1434 05/12/19 1438 05/12/19 1832 05/13/19 1153 05/14/19 0643 05/15/19 0513  WBC 13.7* 12.5*  --   --  10.9* 9.6 9.2  CREATININE 5.29* 5.60*  --   --  5.64* 5.54* 5.38*  LATICACIDVEN  --   --  0.8 0.8  --   --   --     Estimated Creatinine Clearance: 18 mL/min (A) (by C-G formula based on SCr of 5.38 mg/dL (H)).    No Known Allergies  Antimicrobials this admission: Vanc 6/27>>6/30 Cefepime 6/27>>  Dose adjustments this admission: N/A  Microbiology results: 6/27 CEQ:[id number] species. 6/27 BCx: >> NGTD 6/27 Sputum: ordered 6/27: COVID negative 6/27: Urine Strep Pneumo: negative  Thank you for allowing pharmacy to be a part of this patient's care.  Antonietta Jewel, PharmD, [place] Clinical Pharmacist  Pager: [phone] Phone: [phone] 05/15/2019 10:47 AM

## 2019-05-15 NOTE — Plan of Care (Signed)
Social/Plan of Care Note:   Discussed with nephrology/Dr. Carolin Sicks and spoke with the patient via phone.  He does have a GI bleed and some degree of azotemia due to the same.  Recommended initiation of dialysis given the progressive nature of his CKD and in light of continued worsening azotemia.  Louis Bradley is in agreement with starting dialysis.  All of his questions were answered.  Spoke with his wife via phone and she appreciated the call and the care that he is getting at [facility].    Will make pt NPO after midnight in anticipation of IR consult for tunneled catheter and initiating dialysis.  Spoke with Dr. Carolin Sicks and he is in agreement with the NPO order and he will speak with patient in the morning then direct the IR consult and hemodialysis plans.    Louis Jeans, MD

## 2019-05-15 NOTE — Consult Note (Signed)
H&P Physician requesting consult: Wendee Beavers, MD  Chief Complaint: Left-sided hydronephrosis  History of Present Illness: 73 year old male with a history of chronic kidney disease stage IV who was recently hospitalized for GI bleed and NSTEMI presented with shortness of breath and acute respiratory failure.  He was noted to have a creatinine of 5.6 which is up from 5.29 when discharged a few days ago.  Recent values range from 4.5-5.5.  He underwent a CT scan of the abdomen and pelvis for further evaluation which revealed severe chronic left-sided hydronephrosis likely secondary to chronic left ureteropelvic junction obstruction.  There was minimal viable parenchyma left in the kidney.  The patient himself states that he has known about this for many years.  He has been told that it is nonfunctional.  He denies any voiding complaints.  Denies hematuria or dysuria.  Past Medical History:  Diagnosis Date  . Anemia   . Anesthesia complication    General anesthesia makes me feel foggy for long periods of time  . Arthritis   . CKD (chronic kidney disease) stage V requiring chronic dialysis ([place])    Stage 4-5  . Complication of anesthesia    slow to come out of anesthesia - a lot of confusion   . Diabetes ([place])   . Fournier gangrene   . GI bleed   . Heart murmur    Born with, no problems  . History of blood transfusion    x2  . Hyperlipidemia   . Hypertension   . Moderate aortic stenosis   . NSTEMI (non-ST elevated myocardial infarction) ([place])   . Obesity   . OSA on CPAP   . Osteomyelitis ([place]) 2016   L toe  . Pulmonary hypertension ([place])   . Renal atrophy, left   . Vitamin D deficiency    Past Surgical History:  Procedure Laterality Date  . AMPUTATION TOE Left 04/20/2019   Procedure: AMPUTATION LEFT SECOND AND THIRD TOES;  Surgeon: Newt Minion, MD;  Location: [facility];  Service: Orthopedics;  Laterality: Left;  . BIOPSY  05/13/2019   Procedure: BIOPSY;  Surgeon: Laurence Spates, MD;   Location: [facility];  Service: Endoscopy;;  . COLONOSCOPY    . COLOSTOMY  03/2015  . COLOSTOMY REVERSAL    . ESOPHAGOGASTRODUODENOSCOPY N/A 05/13/2019   Procedure: ESOPHAGOGASTRODUODENOSCOPY (EGD);  Surgeon: Laurence Spates, MD;  Location: [facility] ENDOSCOPY;  Service: Endoscopy;  Laterality: N/A;  . IR THORACENTESIS ASP PLEURAL SPACE W/IMG GUIDE  05/15/2019  . TOE AMPUTATION Left 2016   Left big toe amputated due to MRSA infection    Home Medications:  Medications Prior to Admission  Medication Sig Dispense Refill Last Dose  . amLODipine (NORVASC) 10 MG tablet Take 1 tablet (10 mg total) by mouth daily. 90 tablet 1 05/12/2019 at Unknown time  . calcitRIOL (ROCALTROL) 0.25 MCG capsule Take 0.25 mcg by mouth every Monday, Wednesday, and Friday.    05/04/2019  . carvedilol (COREG) 25 MG tablet Take 1 tablet (25 mg total) by mouth 2 (two) times daily with a meal. (Patient taking differently: Take 25 mg by mouth 2 (two) times a day. ) 180 tablet 1 05/12/2019 at 1000  . furosemide (LASIX) 40 MG tablet Take 2 tablets (80 mg total) by mouth daily. 180 tablet 1 05/12/2019 at Unknown time  . glipiZIDE (GLUCOTROL) 5 MG tablet Take 2 tablets (10 mg total) by mouth 2 (two) times daily. 360 tablet 1 05/12/2019 at Unknown time  . nicotine polacrilex (COMMIT) 4 MG  lozenge Take 4 mg by mouth as needed for smoking cessation.   05/12/2019 at prn  . pantoprazole (PROTONIX) 40 MG tablet Take 1 tablet (40 mg total) by mouth 2 (two) times daily. 60 tablet 0 05/12/2019 at Unknown time  . pravastatin (PRAVACHOL) 40 MG tablet Take 1 tablet (40 mg total) by mouth at bedtime. 90 tablet 1 05/11/2019 at Unknown time  . Probiotic Product (PROBIOTIC DAILY PO) Take 1 tablet by mouth every Monday, Wednesday, and Friday.    05/11/2019  . sodium bicarbonate 650 MG tablet Take 650 mg by mouth daily.   05/12/2019 at Unknown time  . sodium zirconium cyclosilicate (LOKELMA) 10 g PACK packet Take 10 g by mouth daily. Use as directed by nephrology  (Patient taking differently: Take 10 g by mouth every Monday, Wednesday, and Friday. Use as directed by nephrology) 30 packet 3 05/11/2019   Allergies: No Known Allergies  Family History  Problem Relation Age of Onset  . CAD Father 43  . CAD Other        2 uncles had heart attacks  . Colon cancer Neg Hx   . Prostate cancer Neg Hx    Social History:  reports that he has quit smoking. He has never used smokeless tobacco. He reports current alcohol use. He reports that he does not use drugs.  ROS: A complete review of systems was performed.  All systems are negative except for pertinent findings as noted. ROS   Physical Exam:  Vital signs in last 24 hours: Temp:  [97.5 F (36.4 C)-97.8 F (36.6 C)] 97.5 F (36.4 C) (06/30 1224) Pulse Rate:  [50-69] 69 (06/30 1224) Resp:  [15-20] 16 (06/30 1224) BP: (121-131)/(59-91) 126/82 (06/30 1748) SpO2:  [93 %-98 %] 98 % (06/30 1224) Weight:  [134 kg] 134 kg (06/30 0500) General:  Alert and oriented, No acute distress HEENT: Normocephalic, atraumatic Neck: No JVD or lymphadenopathy Cardiovascular: Regular rate and rhythm Lungs: Regular rate and effort Abdomen: Soft, nontender, nondistended, no abdominal masses Back: No CVA tenderness Extremities: No edema Neurologic: Grossly intact  Laboratory Data:  Results for orders placed or performed during the hospital encounter of 05/12/19 (from the past 24 hour(s))  Glucose, capillary     Status: Abnormal   Collection Time: 05/14/19  9:25 PM  Result Value Ref Range   Glucose-Capillary 132 (H) 70 - 99 mg/dL  Renal function panel     Status: Abnormal   Collection Time: 05/15/19  5:13 AM  Result Value Ref Range   Sodium 138 135 - 145 mmol/L   Potassium 4.8 3.5 - 5.1 mmol/L   Chloride 106 98 - 111 mmol/L   CO2 21 (L) 22 - 32 mmol/L   Glucose, Bld 117 (H) 70 - 99 mg/dL   BUN 146 (H) 8 - 23 mg/dL   Creatinine, Ser 5.38 (H) 0.61 - 1.24 mg/dL   Calcium 8.1 (L) 8.9 - 10.3 mg/dL   Phosphorus  6.4 (H) 2.5 - 4.6 mg/dL   Albumin 2.7 (L) 3.5 - 5.0 g/dL   GFR calc non Af Amer 10 (L) >60 mL/min   GFR calc Af Amer 11 (L) >60 mL/min   Anion gap 11 5 - 15  Magnesium     Status: Abnormal   Collection Time: 05/15/19  5:13 AM  Result Value Ref Range   Magnesium 2.9 (H) 1.7 - 2.4 mg/dL  TSH     Status: None   Collection Time: 05/15/19  5:13 AM  Result Value Ref Range  TSH 0.965 0.350 - 4.500 uIU/mL  CBC     Status: Abnormal   Collection Time: 05/15/19  5:13 AM  Result Value Ref Range   WBC 9.2 4.0 - 10.5 K/uL   RBC 2.59 (L) 4.22 - 5.81 MIL/uL   Hemoglobin 7.7 (L) 13.0 - 17.0 g/dL   HCT 23.1 (L) 39.0 - 52.0 %   MCV 89.2 80.0 - 100.0 fL   MCH 29.7 26.0 - 34.0 pg   MCHC 33.3 30.0 - 36.0 g/dL   RDW 14.9 11.5 - 15.5 %   Platelets 178 150 - 400 K/uL   nRBC 0.0 0.0 - 0.2 %  Glucose, capillary     Status: Abnormal   Collection Time: 05/15/19  8:05 AM  Result Value Ref Range   Glucose-Capillary 112 (H) 70 - 99 mg/dL  Lactate dehydrogenase (pleural or peritoneal fluid)     Status: Abnormal   Collection Time: 05/15/19  1:00 PM  Result Value Ref Range   LD, Fluid 50 (H) 3 - 23 U/L   Fluid Type-FLDH Pleural R   Body fluid cell count with differential     Status: Abnormal   Collection Time: 05/15/19  1:00 PM  Result Value Ref Range   Fluid Type-FCT Pleural R    Color, Fluid YELLOW    Appearance, Fluid HAZY (A) CLEAR   WBC, Fluid 243 0 - 1,000 cu mm   Neutrophil Count, Fluid 40 (H) 0 - 25 %   Lymphs, Fluid 8 %   Monocyte-Macrophage-Serous Fluid 52 50 - 90 %   Other Cells, Fluid MESOTHELIAL CELLS PRESENT %  Gram stain     Status: None   Collection Time: 05/15/19  1:00 PM   Specimen: Pleural, Right; Pleural Fluid  Result Value Ref Range   Specimen Description PLEURAL    Special Requests FLUID    Gram Stain      WBC PRESENT,BOTH PMN AND MONONUCLEAR NO ORGANISMS SEEN CYTOSPIN SMEAR Performed at [facility], [address] [place], [place] [address]    Report Status  05/15/2019 FINAL   Protein, pleural or peritoneal fluid     Status: None   Collection Time: 05/15/19  1:00 PM  Result Value Ref Range   Total protein, fluid <3.0 g/dL   Fluid Type-FTP Pleural R   Glucose, capillary     Status: Abnormal   Collection Time: 05/15/19  1:26 PM  Result Value Ref Range   Glucose-Capillary 132 (H) 70 - 99 mg/dL  Hemoglobin and hematocrit, blood     Status: Abnormal   Collection Time: 05/15/19  2:34 PM  Result Value Ref Range   Hemoglobin 8.6 (L) 13.0 - 17.0 g/dL   HCT 25.9 (L) 39.0 - 52.0 %  Glucose, capillary     Status: Abnormal   Collection Time: 05/15/19  4:42 PM  Result Value Ref Range   Glucose-Capillary 157 (H) 70 - 99 mg/dL   Recent Results (from the past 240 hour(s))  Novel Coronavirus,NAA,(SEND-OUT TO REF LAB - TAT 24-48 hrs); Hosp Order     Status: None   Collection Time: 05/10/19  7:39 PM   Specimen: Nasopharyngeal Swab; Respiratory  Result Value Ref Range Status   SARS-CoV-2, NAA NOT DETECTED NOT DETECTED Final    Comment: (NOTE) This test was developed and its performance characteristics determined by [employer]. This test has not been FDA cleared or approved. This test has been authorized by FDA under an Emergency Use Authorization (EUA). This test is only authorized  for the duration of time the declaration that circumstances exist justifying the authorization of the emergency use of in vitro diagnostic tests for detection of SARS-CoV-2 virus and/or diagnosis of COVID-19 infection under section 564(b)(1) of the Act, 21 U.S.C. 638LHT-3(S)(2), unless the authorization is terminated or revoked sooner. When diagnostic testing is negative, the possibility of a false negative result should be considered in the context of a patient's recent exposures and the presence of clinical signs and symptoms consistent with COVID-19. An individual without symptoms of COVID-19 and who is not shedding SARS-CoV-2 virus would expect to have a  negative (not detected) result in this assay. Performed  At: [facility] [address] [place], [place] [id number] Rush Farmer MD IO:[id number]    [place]  Final    Comment: Performed at [facility], [place] [address], [place], [place] [address]  SARS Coronavirus 2 (CEPHEID- Performed in [facility] hospital lab), Hosp Order     Status: None   Collection Time: 05/12/19  2:34 PM   Specimen: Nasopharyngeal Swab  Result Value Ref Range Status   SARS Coronavirus 2 NEGATIVE NEGATIVE Final    Comment: (NOTE) If result is NEGATIVE SARS-CoV-2 target nucleic acids are NOT DETECTED. The SARS-CoV-2 RNA is generally detectable in upper and lower  respiratory specimens during the acute phase of infection. The lowest  concentration of SARS-CoV-2 viral copies this assay can detect is 250  copies / mL. A negative result does not preclude SARS-CoV-2 infection  and should not be used as the sole basis for treatment or other  patient management decisions.  A negative result may occur with  improper specimen collection / handling, submission of specimen other  than nasopharyngeal swab, presence of viral mutation(s) within the  areas targeted by this assay, and inadequate number of viral copies  (<250 copies / mL). A negative result must be combined with clinical  observations, patient history, and epidemiological information. If result is POSITIVE SARS-CoV-2 target nucleic acids are DETECTED. The SARS-CoV-2 RNA is generally detectable in upper and lower  respiratory specimens dur ing the acute phase of infection.  Positive  results are indicative of active infection with SARS-CoV-2.  Clinical  correlation with patient history and other diagnostic information is  necessary to determine patient infection status.  Positive results do  not rule out bacterial infection or co-infection with other viruses. If result is PRESUMPTIVE POSTIVE SARS-CoV-2 nucleic acids  MAY BE PRESENT.   A presumptive positive result was obtained on the submitted specimen  and confirmed on repeat testing.  While 2019 novel coronavirus  (SARS-CoV-2) nucleic acids may be present in the submitted sample  additional confirmatory testing may be necessary for epidemiological  and / or clinical management purposes  to differentiate between  SARS-CoV-2 and other Sarbecovirus currently known to infect humans.  If clinically indicated additional testing with an alternate test  methodology [phone]) is advised. The SARS-CoV-2 RNA is generally  detectable in upper and lower respiratory sp ecimens during the acute  phase of infection. The expected result is Negative. Fact Sheet for Patients:  StrictlyIdeas.no Fact Sheet for Healthcare Providers: BankingDealers.co.za This test is not yet approved or cleared by the [place] FDA and has been authorized for detection and/or diagnosis of SARS-CoV-2 by FDA under an Emergency Use Authorization (EUA).  This EUA will remain in effect (meaning this test can be used) for the duration of the COVID-19 declaration under Section 564(b)(1) of the Act, 21 U.S.C. section 360bbb-3(b)(1), unless  the authorization is terminated or revoked sooner. Performed at [facility], [place] [address], [place], [place] [address]   Blood Culture (routine x 2)     Status: None (Preliminary result)   Collection Time: 05/12/19  2:45 PM   Specimen: BLOOD  Result Value Ref Range Status   Specimen Description BLOOD RIGHT ANTECUBITAL  Final   Special Requests   Final    BOTTLES DRAWN AEROBIC AND ANAEROBIC Blood Culture results may not be optimal due to an excessive volume of blood received in culture bottles   Culture   Final    NO GROWTH 3 DAYS Performed at [facility], [place] [address] [place], [place] [address]    Report Status PENDING  Incomplete  Blood Culture (routine x 2)     Status: None  (Preliminary result)   Collection Time: 05/12/19  3:09 PM   Specimen: BLOOD  Result Value Ref Range Status   Specimen Description BLOOD LEFT ANTECUBITAL  Final   Special Requests   Final    BOTTLES DRAWN AEROBIC AND ANAEROBIC Blood Culture adequate volume   Culture   Final    NO GROWTH 3 DAYS Performed at [facility], [place] [address], [place], [place] [address]    Report Status PENDING  Incomplete  Urine Culture     Status: Abnormal   Collection Time: 05/12/19  4:00 PM   Specimen: Urine, Random  Result Value Ref Range Status   Specimen Description URINE, RANDOM  Final   Special Requests   Final    NONE Performed at [facility], [place] [address], [place], [place] [address]    Culture MULTIPLE SPECIES PRESENT, SUGGEST RECOLLECTION (A)  Final   Report Status 05/14/2019 FINAL  Final  Gram stain     Status: None   Collection Time: 05/15/19  1:00 PM   Specimen: Pleural, Right; Pleural Fluid  Result Value Ref Range Status   Specimen Description PLEURAL  Final   Special Requests FLUID  Final   Gram Stain   Final    WBC PRESENT,BOTH PMN AND MONONUCLEAR NO ORGANISMS SEEN CYTOSPIN SMEAR Performed at [facility], [place] [address] [place],  [address]    Report Status 05/15/2019 FINAL  Final   Creatinine: Recent Labs    05/10/19 1827 05/11/19 0935 05/12/19 1434 05/13/19 1153 05/14/19 0643 05/15/19 0513  CREATININE 5.24* 5.29* 5.60* 5.64* 5.54* 5.38*   CT scan personally reviewed and is detailed in the history of present illness  Impression/Assessment:  Left-sided hydronephrosis likely secondary to chronic left ureteropelvic junction obstruction Chronic kidney disease stage V  Plan:  There is minimal parenchyma remaining in that left kidney.  It is unlikely to be contributing any significant function to his overall renal function.  Therefore, no recommendation for further intervention of the left hydronephrosis.  Right kidney is without hydronephrosis  therefore no evidence of an obstructing process causing his renal insufficiency.  No further urological intervention recommended.  Marton Redwood, III 05/15/2019, 6:38 PM

## 2019-05-15 NOTE — Evaluation (Signed)
Physical Therapy Evaluation Patient Details Name: [facility] MRN: [id number] DOB: 1946/06/19 Today's Date: 05/15/2019   History of Present Illness  73 yo admitted with weakness and SOB with sepsis due to PNA and UTI. PT with anemia, CHF, progressive renal failure and AFib. Pt with recent admit 6/25-6/26 with NSTEMI and GIB at which time pt refused EGD and cardiac cath. PMHx: CKD, toe amputations, DM, AS, obesity, OSA, HTN, HLD, anemia  Clinical Impression  PT supine on 3L [place] on arrival, with removal on RA remained 92% and throughout session 90-95% on RA with drop after gait to 89% and pt returned to 1L [place]. Pt with decreased activity tolerance, transfers and mobility who will benefit from acute therapy to maximize function, gait and activity tolerance to decrease burden of care. Pt reports recently moved from [place] as daughter built a pool house cottage for he and his wife. Pt reports a fall since admission where he missed the toilet but no other falls in the last year. Pt would benefit from continued gait trials with RW but pt is resistant to having RW for home and will continue to assess.   SpO2 90-95% on RA HR 61-90 BP EOB 122/63 BP standing 118/68 (74), HR 70    Follow Up Recommendations Home health PT    Equipment Recommendations  Rolling walker with 5" wheels    Recommendations for Other Services OT consult     Precautions / Restrictions Precautions Precautions: Fall      Mobility  Bed Mobility Overal bed mobility: Modified Independent             General bed mobility comments: HOB 30 degrees with increased effort, time and rail  Transfers Overall transfer level: Needs assistance   Transfers: Sit to/from Stand Sit to Stand: Supervision         General transfer comment: supervision for lines and safety  Ambulation/Gait Ambulation/Gait assistance: Min guard Gait Distance (Feet): 100 Feet Assistive device: IV Pole Gait Pattern/deviations: Step-through  pattern;Decreased stride length   Gait velocity interpretation: 1.31 - 2.62 ft/sec, indicative of limited community ambulator General Gait Details: pt with cautious gait, refused RW for gait but reaching out for support of environment and IV pole throughout gait. Pt with slightly unsteady gait with need for guarding for balance and safety with assist to control IV pole  Stairs            Wheelchair Mobility    Modified Rankin (Stroke Patients Only)       Balance Overall balance assessment: Needs assistance   Sitting balance-Leahy Scale: Good       Standing balance-Leahy Scale: Good Standing balance comment: pt able to stand without UE support but benefits from UE support for dynamic activities                             Pertinent Vitals/Pain Pain Assessment: No/denies pain    Home Living Family/patient expects to be discharged to:: Private residence Living Arrangements: Spouse/significant other Available Help at Discharge: Family;Available 24 hours/day Type of Home: House Home Access: Level entry     Home Layout: One level Home Equipment: Transport planner      Prior Function Level of Independence: Independent         Comments: scooter occasionally for long distance     Hand Dominance        Extremity/Trunk Assessment   Upper Extremity Assessment Upper Extremity Assessment: Generalized weakness  Lower Extremity Assessment Lower Extremity Assessment: Generalized weakness    Cervical / Trunk Assessment Cervical / Trunk Assessment: Other exceptions Cervical / Trunk Exceptions: rounded shoulders  Communication   Communication: No difficulties  Cognition Arousal/Alertness: Awake/alert Behavior During Therapy: WFL for tasks assessed/performed Overall Cognitive Status: Within Functional Limits for tasks assessed                                        General Comments      Exercises General Exercises - Lower  Extremity Long Arc Quad: AROM;20 reps;Seated;Both Hip Flexion/Marching: AROM;20 reps;Seated;Both   Assessment/Plan    PT Assessment Patient needs continued PT services  PT Problem List Decreased mobility;Decreased activity tolerance;Decreased balance;Decreased knowledge of use of DME       PT Treatment Interventions DME instruction;Therapeutic activities;Gait training;Therapeutic exercise;Patient/family education;Balance training;Functional mobility training    PT Goals (Current goals can be found in the Care Plan section)  Acute Rehab PT Goals Patient Stated Goal: return home to reading and travel PT Goal Formulation: With patient Time For Goal Achievement: 05/29/19 Potential to Achieve Goals: Good    Frequency Min 3X/week   Barriers to discharge        Co-evaluation               AM-PAC PT "6 Clicks" Mobility  Outcome Measure Help needed turning from your back to your side while in a flat bed without using bedrails?: None Help needed moving from lying on your back to sitting on the side of a flat bed without using bedrails?: A Little Help needed moving to and from a bed to a chair (including a wheelchair)?: A Little Help needed standing up from a chair using your arms (e.g., wheelchair or bedside chair)?: A Little Help needed to walk in hospital room?: A Little Help needed climbing 3-5 steps with a railing? : A Little 6 Click Score: 19    End of Session Equipment Utilized During Treatment: Gait belt Activity Tolerance: Patient tolerated treatment well Patient left: in chair;with chair alarm set;with call bell/phone within reach Nurse Communication: Mobility status PT Visit Diagnosis: Other abnormalities of gait and mobility (R26.89)    Time: 5852-7782 PT Time Calculation (min) (ACUTE ONLY): 29 min   Charges:   PT Evaluation $PT Eval Moderate Complexity: 1 Mod PT Treatments $Gait Training: 8-22 mins        [place] Pager: [phone] Office: [address] 05/15/2019, 12:16 PM

## 2019-05-15 NOTE — Telephone Encounter (Signed)
error 

## 2019-05-15 NOTE — Progress Notes (Addendum)
[facility] NEPHROLOGY PROGRESS NOTE  Assessment/ Plan: Pt is a 73 y.o. yo male with history of hypertension, OSA, recent hospitalization for GI bleeding/NSTEMI presented with SOB and melena.  In ER patient was hypotensive to 80s, hemoglobin 6.9 and creatinine level of 5.6.  We are consulted for CKD management.  # CKD stage IV/V: Acute rise in creatinine probably due to hypotensive episode in ER,  anemia and left kidney obstruction.  For CKD he follows with Dr. Royce Macadamia at [facility] and already had upper extremity pain mapping done.  The CT scan showed severe chronic left-sided hydronephrosis for which urology was consulted.  He has urine output of 2.4 L with improvement in edema.  He received another dose of IV Lasix today.  Serum creatinine level trending down to 5.38.  BUN level is elevated out of proportion to his renal failure likely due to GI bleed.  He has no uremic symptoms.  Switch to oral Lasix from tomorrow.  Patient is hesitant about starting dialysis and he has no overt uremic symptoms to warrant dialysis today. Daily assessment for HD needs and will await urology evaluation.  #Severe chronic left-sided hydronephrosis likely due to chronic left UPJ obstruction: Noted that urology was already consulted.  #Hypertension/volume: Blood pressure acceptable.  Continue Coreg.  Monitor blood pressure.  #Anemia due to GI bleed and CKD: Receiving blood transfusion.  Iron saturation 9 therefore received a dose of Feraheme and ESA on 6/29.  #Metabolic bone disease: Follow-up PTH level.  Phosphorus elevated therefore started PhosLo.  Continue calcitriol.  #Metabolic acidosis.   continue sodium bicarbonate.  #Respiratory failure possible HCAP: On cefepime per primary team.  #CHF/pulmonary hypertension: Diuretics as above.  Seen by cardiology, no intervention at this time.  Subjective: Seen and examined at bedside.  He reported feeling good.  Denies nausea, vomiting, loss of  appetite, dysgeusia, shortness of breath, chest pain, headache.  Lower extremity edema is improved.  Objective Vital signs in last 24 hours: Vitals:   05/15/19 0753 05/15/19 0853 05/15/19 0942 05/15/19 1010  BP: 131/74 121/64 127/62 129/67  Pulse: (!) 50 (!) 50 (!) 55 (!) 56  Resp: 18 15 18 19   Temp:   97.8 F (36.6 C) 97.8 F (36.6 C)  TempSrc:   Oral Oral  SpO2: 97% 98% 98% 98%  Weight:      Height:       Weight change: 0.189 kg  Intake/Output Summary (Last 24 hours) at 05/15/2019 1140 Last data filed at 05/15/2019 0900 Gross per 24 hour  Intake 642 ml  Output 2950 ml  Net -2308 ml       Labs: Basic Metabolic Panel: Recent Labs  Lab 05/13/19 1153 05/14/19 0643 05/15/19 0513  NA 136 139 138  K 4.7 4.4 4.8  CL 106 107 106  CO2 19* 19* 21*  GLUCOSE 140* 89 117*  BUN 139* 146* 146*  CREATININE 5.64* 5.54* 5.38*  CALCIUM 7.7* 7.6* 8.1*  PHOS 6.0* 6.9* 6.4*   Liver Function Tests: Recent Labs  Lab 05/10/19 1924 05/12/19 1434 05/13/19 1153 05/14/19 0643 05/15/19 0513  AST 18 13*  --   --   --   ALT 19 15  --   --   --   ALKPHOS 52 50  --   --   --   BILITOT 0.5 0.4  --   --   --   PROT 6.1* 5.8*  --   --   --   ALBUMIN 3.2* 3.0* 2.8*  2.6* 2.7*   No results for input(s): LIPASE, AMYLASE in the last 168 hours. No results for input(s): AMMONIA in the last 168 hours. CBC: Recent Labs  Lab 05/11/19 0935 05/12/19 1434 05/13/19 1153 05/13/19 1707 05/14/19 0643 05/15/19 0513  WBC 13.7* 12.5* 10.9*  --  9.6 9.2  NEUTROABS  --  11.3*  --   --  7.8*  --   HGB 6.8* 6.9* 6.8* 7.9* 7.0* 7.7*  HCT 20.9* 21.7* 20.9* 23.8* 21.6* 23.1*  MCV 90.1 91.6 90.5  --  91.5 89.2  PLT 202 181 170  --  177 178   Cardiac Enzymes: No results for input(s): CKTOTAL, CKMB, CKMBINDEX, TROPONINI in the last 168 hours. CBG: Recent Labs  Lab 05/14/19 0736 05/14/19 1204 05/14/19 1636 05/14/19 2125 05/15/19 0805  GLUCAP 93 171* 139* 132* 112*    Iron Studies:  Recent  Labs    05/13/19 1707  IRON 30*  TIBC 318  FERRITIN 205   Studies/Results: Ct Abdomen Pelvis Wo Contrast  Result Date: 05/14/2019 CLINICAL DATA:  Hydronephrosis EXAM: CT ABDOMEN AND PELVIS WITHOUT CONTRAST TECHNIQUE: Multidetector CT imaging of the abdomen and pelvis was performed following the standard protocol without IV contrast. COMPARISON:  Renal ultrasound from the same day FINDINGS: Lower chest: There are moderate to large bilateral pleural effusions, right greater than left. There is interlobular septal thickening bilaterally. There is a focal airspace opacity within the right middle lobe. There are distal scattered airspace opacities throughout the remaining lower lung fields.The heart size is normal. Mitral valve calcifications are noted. Coronary artery calcifications are noted. The intracardiac blood pool is hypodense relative to the adjacent myocardium consistent with anemia. Hepatobiliary: The liver is normal. Normal gallbladder.There is no biliary ductal dilation. Pancreas: Normal contours without ductal dilatation. No peripancreatic fluid collection. Spleen: The spleen is borderline enlarged. Adrenals/Urinary Tract: --Adrenal glands: No adrenal hemorrhage. --Right kidney/ureter: There is no right-sided hydronephrosis. There are multiple right-sided cysts, some which are hyperdense. --Left kidney/ureter: There is severe left-sided hydronephrosis that appears longstanding. There is extensive cortical thinning. This is likely secondary to a chronic left UPJ obstruction. --Urinary bladder: Unremarkable. Stomach/Bowel: --Stomach/Duodenum: No hiatal hernia or other gastric abnormality. Normal duodenal course and caliber. --Small bowel: No dilatation or inflammation. --Colon: No focal abnormality. --Appendix: Normal. Vascular/Lymphatic: Atherosclerotic calcification is present within the non-aneurysmal abdominal aorta, without hemodynamically significant stenosis. --No retroperitoneal  lymphadenopathy. --No mesenteric lymphadenopathy. --No pelvic or inguinal lymphadenopathy. Reproductive: Unremarkable Other: No ascites or free air. There is a left lower quadrant abdominal wall hernia containing multiple loops of small bowel without evidence of an obstruction. Musculoskeletal. No acute displaced fractures. IMPRESSION: 1. Severe chronic left-sided hydronephrosis, likely secondary to a chronic left UPJ obstruction. 2. No right-sided hydronephrosis. 3. Moderate to large bilateral pleural effusions, right greater than left. 4. Interlobular septal thickening involving the bilateral lower lung fields is consistent with volume overload. There is scattered bilateral airspace opacities which may be secondary to pulmonary edema, however an infectious process is not excluded, especially within the right middle lobe. There is compressive atelectasis at the lung bases. 5. Left lower quadrant abdominal wall hernia containing loops of small bowel without evidence of an obstruction. Aortic Atherosclerosis (ICD10-I70.0). Electronically Signed   By: Constance Holster M.D.   On: 05/14/2019 21:01   Dg Chest 2 View  Result Date: 05/13/2019 CLINICAL DATA:  Hypoxemia EXAM: CHEST - 2 VIEW COMPARISON:  05/12/2019 FINDINGS: There is a dense, masslike airspace opacity of the perihilar right lung which is  more apparent than on prior examinations, with probable small layering pleural effusions. There is underlying diffuse interstitial pulmonary opacity, likely edema. Cardiomegaly. IMPRESSION: There is a dense, masslike airspace opacity of the perihilar right lung which is more apparent than on prior examinations, with probable small layering pleural effusions. There is underlying diffuse interstitial pulmonary opacity, likely edema. Cardiomegaly. Electronically Signed   By: Eddie Candle M.D.   On: 05/13/2019 13:06   Dg Shoulder Right  Result Date: 05/14/2019 CLINICAL DATA:  Shoulder pain.  Fall.  Arthritis. EXAM:  RIGHT SHOULDER - 2+ VIEW COMPARISON:  None. FINDINGS: There is no acute displaced fracture or dislocation. There is mild-to-moderate osteoarthritis involving the right glenohumeral and right AC joints. IMPRESSION: 1. No acute osseous abnormality. 2. Mild-to-moderate osteoarthritis of the right glenohumeral and right AC joints. Electronically Signed   By: Constance Holster M.D.   On: 05/14/2019 19:29   US Renal  Result Date: 05/14/2019 CLINICAL DATA:  Acute kidney injury, history hypertension, diabetes mellitus, former smoker, stage 5 chronic kidney disease EXAM: RENAL / URINARY TRACT ULTRASOUND COMPLETE COMPARISON:  None FINDINGS: Right Kidney: Renal measurements: 12.9 x 6.6 x 7.8 cm = volume: 346 mL. Cortical thinning. Increased cortical echogenicity. No hydronephrosis or shadowing calcification. Three cysts present. Small cyst at mid kidney 2.5 x 2.0 x 2.3 cm. Cyst at inferior pole 4.6 x 6.0 x 3.6 cm. Tiny upper pole renal cyst 1.5 x 1.3 x 2.2 cm. Left Kidney: Renal measurements: 13.9 x 7.06.60 cm = volume: 308 mL. Cortical thinning. Increased cortical echogenicity. Multiple peripelvic cysts versus moderate hydronephrosis. Question separate cyst at upper pole 4.1 x 5.1 x 4.5 cm. Bladder: Decompressed, contains minimal urine Dependent shadowing calculus within gallbladder 11 mm diameter. IMPRESSION: Cortical atrophy and medical renal disease changes of both kidneys. Three RIGHT renal cysts as above. Multiple peripelvic cysts versus moderate hydronephrosis; noncontrast CT abdomen recommended to differentiate between cystic disease and hydronephrosis. Electronically Signed   By: Lavonia Dana M.D.   On: 05/14/2019 14:40    Medications: Infusions: . sodium chloride    . ceFEPime (MAXIPIME) IV 2 g (05/14/19 2135)  . doxycycline (VIBRAMYCIN) IV 100 mg (05/15/19 0447)    Scheduled Medications: . atorvastatin  20 mg Oral q1800  . calcitRIOL  0.25 mcg Oral Q M,W,F  . calcium acetate  667 mg Oral TID WC  .  carvedilol  6.25 mg Oral BID WC  . guaiFENesin  600 mg Oral BID  . insulin aspart  0-9 Units Subcutaneous TID WC  . pantoprazole (PROTONIX) IV  40 mg Intravenous Q12H  . sodium bicarbonate  650 mg Oral BID  . sodium chloride flush  3 mL Intravenous Q12H  . sodium zirconium cyclosilicate  10 g Oral Once per day on Mon Wed Fri    have reviewed scheduled and prn medications.  Physical Exam: General: Not in distress, comfortable Heart:RRR, s1s2 nl, no rubs Lungs: Basal crackle, no wheezing Abdomen:soft, Non-tender, non-distended Extremities: Lower extremity edema improved Neurology: Alert, awake, following commands.  Dron Prasad Bhandari 05/15/2019,11:40 AM  LOS: 3 days  Pager: [phone]

## 2019-05-15 NOTE — Telephone Encounter (Signed)
Dr. Burt Knack of interventional cardiology states that he must wait until he demonstrates stability in upper GI bleeding situation before proceeding with invasive evaluation. I defer to his expertise on this matter. Thanks. Candee Furbish, MD

## 2019-05-15 NOTE — Procedures (Signed)
PROCEDURE SUMMARY:  Successful image-guided right thoracentesis. Yielded 800 milliliters of clear yellow fluid. Patient tolerated procedure well. EBL: Zero No immediate complications.  Specimen was sent for labs. Post procedure CXR shows no pneumothorax.  Please see imaging section of Epic for full dictation.  Joaquim Nam PA-C 05/15/2019 12:49 PM

## 2019-05-15 NOTE — Progress Notes (Signed)
PROGRESS NOTE  Louis Bradley UEA:[id number] DOB: 1946-10-17 DOA: 05/12/2019 PCP: Colon Branch, MD   LOS: 3 days   Patient is from: Home  Brief Narrative / Interim history: 73 year old male with history of HTN, CKD-4, OSA on CPAP, recent hospitalization from 6/25-6/26 for GIB/NSTEMI presenting with generalized weakness, shortness of breath, weight cough and melena. Reportedly declined EGD and LHC at his recent hospitalization for GIB and end STEMI and went home.  In ED, low BP to 88/49 that is improved with IV fluids.  Mild low temperature to 96.  WBC 12.5.  Hgb 6.9 (6.8 on 6/26 prior to discharge although received 1 unit after that).  Sodium 132.  BUN 134.  Creatinine 5.6.  Troponin 2283.  BNP 1225 (about the same on 6/25).  Lactic acid 0.8.  Urinalysis suggestive for UTI.  CXR concerning for multifocal airspace opacities concerning for pneumonia.  FOBT positive. Patient was started on vancomycin and cefepime empirically for pneumonia and UTI.  One unit blood ordered and admitted for further care.  Subjective: No major events overnight of this morning.  No complaints.  Breathing improved.  Denies chest pain.  Still with some pain on limited range of motion in right shoulder.  Denies knee pain.  Had fair urine output.  Slight improvement in his renal function.  CT abdomen and pelvis revealed severe chronic left hydronephrosis with extensive cortical thinning of left kidney, moderate to large bilateral pleural effusions right greater than left, focal area of opacity of RML and interlobular septal thickening consistent with fluid overload.  Objective: Vitals:   05/15/19 0753 05/15/19 0853 05/15/19 0942 05/15/19 1010  BP: 131/74 121/64 127/62 129/67  Pulse: (!) 50 (!) 50 (!) 55 (!) 56  Resp: 18 15 18 19   Temp:   97.8 F (36.6 C) 97.8 F (36.6 C)  TempSrc:   Oral Oral  SpO2: 97% 98% 98% 98%  Weight:      Height:        Intake/Output Summary (Last 24 hours) at 05/15/2019 1052 Last data  filed at 05/15/2019 0900 Gross per 24 hour  Intake 642 ml  Output 2950 ml  Net -2308 ml   Filed Weights   05/13/19 1425 05/14/19 0553 05/15/19 0500  Weight: 133.8 kg 135.2 kg 134 kg    Examination:  GENERAL: No acute distress.  Appears well.  HEENT: MMM.  Vision and hearing grossly intact.  NECK: Supple.  +JVD.  LUNGS:  No IWOB. Good air movement bilaterally. HEART:  RRR. Heart sounds normal.  ABD: Bowel sounds present. Soft. Non tender.  MSK/EXT:  Moves all extremities. No apparent deformity. No edema bilaterally.  SKIN: no apparent skin lesion or wound NEURO: Awake, alert and oriented appropriately.  No gross deficit.  PSYCH: Calm. Normal affect.  I have personally reviewed the following labs and images: CBC: Recent Labs  Lab 05/11/19 0935 05/12/19 1434 05/13/19 1153 05/13/19 1707 05/14/19 0643 05/15/19 0513  WBC 13.7* 12.5* 10.9*  --  9.6 9.2  NEUTROABS  --  11.3*  --   --  7.8*  --   HGB 6.8* 6.9* 6.8* 7.9* 7.0* 7.7*  HCT 20.9* 21.7* 20.9* 23.8* 21.6* 23.1*  MCV 90.1 91.6 90.5  --  91.5 89.2  PLT 202 181 170  --  177 478   Basic Metabolic Panel: Recent Labs  Lab 05/10/19 1924 05/11/19 0935 05/12/19 1434 05/13/19 1153 05/14/19 0643 05/15/19 0513  NA  --  137 132* 136 139 138  K  --  4.6 4.7 4.7 4.4 4.8  CL  --  106 101 106 107 106  CO2  --  20* 18* 19* 19* 21*  GLUCOSE  --  139* 182* 140* 89 117*  BUN  --  119* 134* 139* 146* 146*  CREATININE  --  5.29* 5.60* 5.64* 5.54* 5.38*  CALCIUM  --  8.1* 7.8* 7.7* 7.6* 8.1*  MG 3.0* 2.9*  --  3.2* 3.0* 2.9*  PHOS  --   --   --  6.0* 6.9* 6.4*   GFR: Estimated Creatinine Clearance: 18 mL/min (A) (by C-G formula based on SCr of 5.38 mg/dL (H)). Liver Function Tests: Recent Labs  Lab 05/10/19 1924 05/12/19 1434 05/13/19 1153 05/14/19 0643 05/15/19 0513  AST 18 13*  --   --   --   ALT 19 15  --   --   --   ALKPHOS 52 50  --   --   --   BILITOT 0.5 0.4  --   --   --   PROT 6.1* 5.8*  --   --   --    ALBUMIN 3.2* 3.0* 2.8* 2.6* 2.7*   No results for input(s): LIPASE, AMYLASE in the last 168 hours. No results for input(s): AMMONIA in the last 168 hours. Coagulation Profile: Recent Labs  Lab 05/10/19 1924  INR 1.1   Cardiac Enzymes: No results for input(s): CKTOTAL, CKMB, CKMBINDEX, TROPONINI in the last 168 hours. BNP (last 3 results) No results for input(s): PROBNP in the last 8760 hours. HbA1C: No results for input(s): HGBA1C in the last 72 hours. CBG: Recent Labs  Lab 05/14/19 0736 05/14/19 1204 05/14/19 1636 05/14/19 2125 05/15/19 0805  GLUCAP 93 171* 139* 132* 112*   Lipid Profile: No results for input(s): CHOL, HDL, LDLCALC, TRIG, CHOLHDL, LDLDIRECT in the last 72 hours. Thyroid Function Tests: Recent Labs    05/15/19 0513  TSH 0.965   Anemia Panel: Recent Labs    05/13/19 1704 05/13/19 1707  VITAMINB12  --  442  FOLATE 29.7  --   FERRITIN  --  205  TIBC  --  318  IRON  --  30*  RETICCTPCT  --  4.8*   Urine analysis:    Component Value Date/Time   COLORURINE YELLOW 05/12/2019 1600   APPEARANCEUR HAZY (A) 05/12/2019 1600   LABSPEC 1.013 05/12/2019 1600   PHURINE 5.0 05/12/2019 1600   GLUCOSEU 50 (A) 05/12/2019 1600   HGBUR SMALL (A) 05/12/2019 1600   BILIRUBINUR NEGATIVE 05/12/2019 1600   KETONESUR NEGATIVE 05/12/2019 1600   PROTEINUR 100 (A) 05/12/2019 1600   NITRITE NEGATIVE 05/12/2019 1600   LEUKOCYTESUR LARGE (A) 05/12/2019 1600   Sepsis Labs: Invalid input(s): PROCALCITONIN, LACTICIDVEN  Recent Results (from the past 240 hour(s))  Novel Coronavirus,NAA,(SEND-OUT TO REF LAB - TAT 24-48 hrs); Hosp Order     Status: None   Collection Time: 05/10/19  7:39 PM   Specimen: Nasopharyngeal Swab; Respiratory  Result Value Ref Range Status   SARS-CoV-2, NAA NOT DETECTED NOT DETECTED Final    Comment: (NOTE) This test was developed and its performance characteristics determined by [employer]. This test has not been FDA cleared or  approved. This test has been authorized by FDA under an Emergency Use Authorization (EUA). This test is only authorized for the duration of time the declaration that circumstances exist justifying the authorization of the emergency use of in vitro diagnostic tests for detection of SARS-CoV-2 virus and/or diagnosis of COVID-19 infection under section  564(b)(1) of the Act, 21 U.S.C. 093OIZ-1(I)(4), unless the authorization is terminated or revoked sooner. When diagnostic testing is negative, the possibility of a false negative result should be considered in the context of a patient's recent exposures and the presence of clinical signs and symptoms consistent with COVID-19. An individual without symptoms of COVID-19 and who is not shedding SARS-CoV-2 virus would expect to have a negative (not detected) result in this assay. Performed  At: [facility] [address] [place], [place] [id number] Rush Farmer MD SN:[id number]    [place]  Final    Comment: Performed at [facility], [place] [address], [place], [place] [address]  SARS Coronavirus 2 (CEPHEID- Performed in [place] hospital lab), Hosp Order     Status: None   Collection Time: 05/12/19  2:34 PM   Specimen: Nasopharyngeal Swab  Result Value Ref Range Status   SARS Coronavirus 2 NEGATIVE NEGATIVE Final    Comment: (NOTE) If result is NEGATIVE SARS-CoV-2 target nucleic acids are NOT DETECTED. The SARS-CoV-2 RNA is generally detectable in upper and lower  respiratory specimens during the acute phase of infection. The lowest  concentration of SARS-CoV-2 viral copies this assay can detect is 250  copies / mL. A negative result does not preclude SARS-CoV-2 infection  and should not be used as the sole basis for treatment or other  patient management decisions.  A negative result may occur with  improper specimen collection / handling, submission of specimen other  than nasopharyngeal swab,  presence of viral mutation(s) within the  areas targeted by this assay, and inadequate number of viral copies  (<250 copies / mL). A negative result must be combined with clinical  observations, patient history, and epidemiological information. If result is POSITIVE SARS-CoV-2 target nucleic acids are DETECTED. The SARS-CoV-2 RNA is generally detectable in upper and lower  respiratory specimens dur ing the acute phase of infection.  Positive  results are indicative of active infection with SARS-CoV-2.  Clinical  correlation with patient history and other diagnostic information is  necessary to determine patient infection status.  Positive results do  not rule out bacterial infection or co-infection with other viruses. If result is PRESUMPTIVE POSTIVE SARS-CoV-2 nucleic acids MAY BE PRESENT.   A presumptive positive result was obtained on the submitted specimen  and confirmed on repeat testing.  While 2019 novel coronavirus  (SARS-CoV-2) nucleic acids may be present in the submitted sample  additional confirmatory testing may be necessary for epidemiological  and / or clinical management purposes  to differentiate between  SARS-CoV-2 and other Sarbecovirus currently known to infect humans.  If clinically indicated additional testing with an alternate test  methodology [phone]) is advised. The SARS-CoV-2 RNA is generally  detectable in upper and lower respiratory sp ecimens during the acute  phase of infection. The expected result is Negative. Fact Sheet for Patients:  StrictlyIdeas.no Fact Sheet for Healthcare Providers: BankingDealers.co.za This test is not yet approved or cleared by the [place] FDA and has been authorized for detection and/or diagnosis of SARS-CoV-2 by FDA under an Emergency Use Authorization (EUA).  This EUA will remain in effect (meaning this test can be used) for the duration of the COVID-19 declaration under  Section 564(b)(1) of the Act, 21 U.S.C. section 360bbb-3(b)(1), unless the authorization is terminated or revoked sooner. Performed at [facility], [place] [address] [place], [place] [address]   Blood Culture (routine x 2)     Status: None (Preliminary  result)   Collection Time: 05/12/19  2:45 PM   Specimen: BLOOD  Result Value Ref Range Status   Specimen Description BLOOD RIGHT ANTECUBITAL  Final   Special Requests   Final    BOTTLES DRAWN AEROBIC AND ANAEROBIC Blood Culture results may not be optimal due to an excessive volume of blood received in culture bottles   Culture   Final    NO GROWTH 3 DAYS Performed at [facility], [place] [address], [place], [place] [address]    Report Status PENDING  Incomplete  Blood Culture (routine x 2)     Status: None (Preliminary result)   Collection Time: 05/12/19  3:09 PM   Specimen: BLOOD  Result Value Ref Range Status   Specimen Description BLOOD LEFT ANTECUBITAL  Final   Special Requests   Final    BOTTLES DRAWN AEROBIC AND ANAEROBIC Blood Culture adequate volume   Culture   Final    NO GROWTH 3 DAYS Performed at [facility], [place] [address], [place], [place] [address]    Report Status PENDING  Incomplete  Urine Culture     Status: Abnormal   Collection Time: 05/12/19  4:00 PM   Specimen: Urine, Random  Result Value Ref Range Status   Specimen Description URINE, RANDOM  Final   Special Requests   Final    NONE Performed at [facility], [place] [address], [place],  [address]    Culture MULTIPLE SPECIES PRESENT, SUGGEST RECOLLECTION (A)  Final   Report Status 05/14/2019 FINAL  Final     Radiology Studies: Ct Abdomen Pelvis Wo Contrast  Result Date: 05/14/2019 CLINICAL DATA:  Hydronephrosis EXAM: CT ABDOMEN AND PELVIS WITHOUT CONTRAST TECHNIQUE: Multidetector CT imaging of the abdomen and pelvis was performed following the standard protocol without IV contrast. COMPARISON:  Renal ultrasound from the same  day FINDINGS: Lower chest: There are moderate to large bilateral pleural effusions, right greater than left. There is interlobular septal thickening bilaterally. There is a focal airspace opacity within the right middle lobe. There are distal scattered airspace opacities throughout the remaining lower lung fields.The heart size is normal. Mitral valve calcifications are noted. Coronary artery calcifications are noted. The intracardiac blood pool is hypodense relative to the adjacent myocardium consistent with anemia. Hepatobiliary: The liver is normal. Normal gallbladder.There is no biliary ductal dilation. Pancreas: Normal contours without ductal dilatation. No peripancreatic fluid collection. Spleen: The spleen is borderline enlarged. Adrenals/Urinary Tract: --Adrenal glands: No adrenal hemorrhage. --Right kidney/ureter: There is no right-sided hydronephrosis. There are multiple right-sided cysts, some which are hyperdense. --Left kidney/ureter: There is severe left-sided hydronephrosis that appears longstanding. There is extensive cortical thinning. This is likely secondary to a chronic left UPJ obstruction. --Urinary bladder: Unremarkable. Stomach/Bowel: --Stomach/Duodenum: No hiatal hernia or other gastric abnormality. Normal duodenal course and caliber. --Small bowel: No dilatation or inflammation. --Colon: No focal abnormality. --Appendix: Normal. Vascular/Lymphatic: Atherosclerotic calcification is present within the non-aneurysmal abdominal aorta, without hemodynamically significant stenosis. --No retroperitoneal lymphadenopathy. --No mesenteric lymphadenopathy. --No pelvic or inguinal lymphadenopathy. Reproductive: Unremarkable Other: No ascites or free air. There is a left lower quadrant abdominal wall hernia containing multiple loops of small bowel without evidence of an obstruction. Musculoskeletal. No acute displaced fractures. IMPRESSION: 1. Severe chronic left-sided hydronephrosis, likely secondary  to a chronic left UPJ obstruction. 2. No right-sided hydronephrosis. 3. Moderate to large bilateral pleural effusions, right greater than left. 4. Interlobular septal thickening involving the bilateral lower lung fields is consistent with  volume overload. There is scattered bilateral airspace opacities which may be secondary to pulmonary edema, however an infectious process is not excluded, especially within the right middle lobe. There is compressive atelectasis at the lung bases. 5. Left lower quadrant abdominal wall hernia containing loops of small bowel without evidence of an obstruction. Aortic Atherosclerosis (ICD10-I70.0). Electronically Signed   By: Constance Holster M.D.   On: 05/14/2019 21:01   Dg Shoulder Right  Result Date: 05/14/2019 CLINICAL DATA:  Shoulder pain.  Fall.  Arthritis. EXAM: RIGHT SHOULDER - 2+ VIEW COMPARISON:  None. FINDINGS: There is no acute displaced fracture or dislocation. There is mild-to-moderate osteoarthritis involving the right glenohumeral and right AC joints. IMPRESSION: 1. No acute osseous abnormality. 2. Mild-to-moderate osteoarthritis of the right glenohumeral and right AC joints. Electronically Signed   By: Constance Holster M.D.   On: 05/14/2019 19:29   US Renal  Result Date: 05/14/2019 CLINICAL DATA:  Acute kidney injury, history hypertension, diabetes mellitus, former smoker, stage 5 chronic kidney disease EXAM: RENAL / URINARY TRACT ULTRASOUND COMPLETE COMPARISON:  None FINDINGS: Right Kidney: Renal measurements: 12.9 x 6.6 x 7.8 cm = volume: 346 mL. Cortical thinning. Increased cortical echogenicity. No hydronephrosis or shadowing calcification. Three cysts present. Small cyst at mid kidney 2.5 x 2.0 x 2.3 cm. Cyst at inferior pole 4.6 x 6.0 x 3.6 cm. Tiny upper pole renal cyst 1.5 x 1.3 x 2.2 cm. Left Kidney: Renal measurements: 13.9 x 7.06.60 cm = volume: 308 mL. Cortical thinning. Increased cortical echogenicity. Multiple peripelvic cysts versus moderate  hydronephrosis. Question separate cyst at upper pole 4.1 x 5.1 x 4.5 cm. Bladder: Decompressed, contains minimal urine Dependent shadowing calculus within gallbladder 11 mm diameter. IMPRESSION: Cortical atrophy and medical renal disease changes of both kidneys. Three RIGHT renal cysts as above. Multiple peripelvic cysts versus moderate hydronephrosis; noncontrast CT abdomen recommended to differentiate between cystic disease and hydronephrosis. Electronically Signed   By: Lavonia Dana M.D.   On: 05/14/2019 14:40     Procedures:  None  6/26: Echo: EF of 62%, diastolic dysfunction, hypokinesis of apical anterior wall, apical septal wall and lateral apex.  Moderate LAE.  Moderate AS.  PASP 53 mmHg.   Microbiology: 6/27: Blood cultures no growth so far 6/27: COVID-19 screen negative 6/27: Urine culture multiple species. 6/27: Sputum culture pending  Assessment & Plan: Acute respiratory failure: Multifactorial including acute on chronic CHF, non-STEMI, pleural effusion, anemia and possible pneumonia.  Chest x-ray reviewed by me favor CHF versus pneumonia.  -Treat treatable causes as above -Wean oxygen as able  Acute on chronic combined CHF/moderate pulmonary hypertension: patient came in with dyspnea.  Bibasilar crackles on exam.  Positive JVD. Trace edema bilaterally.  BNP 1225 (1114 two days prior). CXR concerning for CHF.  CT abdomen reveals moderate to large pleural effusion, right greater than left and other findings suggestive for CHF. Breathing improving.  Fair urine output. -Lasix per nephrology -Daily weight, intake output and renal functions -Salt and fluid restrictions -Closely monitor electrolytes and replenish aggressively  Moderate to large bilateral pleural effusion, right greater than left: Likely due to fluid overload from CHF and renal failure versus infectious process. -Ultrasound-guided thoracocentesis ordered. -Pleural fluid study ordered  -Fluid management per  cardiology and nephrology.  Symptomatic anemia due to GI bleed/iron deficiency anemia: had melanotic stool on admission.  Iron saturation 9% -Had EGD on 6/28 that revealed gastritis and duodenitis but no active bleed. -Hemoglobin 6.9>1u>7.9>7.0>1u> 7.7> -Keep hemoglobin greater than 8.0 due to  non-STEMI. -Monitor H&H -Continue PPI -Iron infusion and Epo -GI rec-signed off  Pneumonia?:  CXR favors fluid versus infectious process. Mild low temp to 96 F and mild leukocytosis.  Lactic acid negative.  Cultures negative so far.  COVID-19 screen negative.  Procalcitonin marginal.  CT abdomen reveals RML opacity which could also be due to fluid overload. -Discontinue vancomycin 6/28 -Continue cefepime and doxycycline -Follow sputum culture  Pyuria versus UTI: Patient without UTI symptoms.  Urinalysis with large LE -Continue cefepime for questionable pneumonia.  Non-STEMI: Troponin elevated to 2200.  EKG with some T wave inversions which seems to be old.  -No anticoagulation due to GI bleed -Carvedilol at a lower dose. -Atorvastatin. -Appreciate cardiology input-medical management for now given his anemia and advanced renal failure  CKD-5/azotemia/BMD: creatinine 5.6 (5.29 when discharged on 6/26).  Recent values range from 4.5-5.5. Azotemia could be due to GI bleed and renal failure.  Continues to have fair urine output. -Nephrology involved.  Severe chronic left hydronephrosis with cortical thinning of left kidney:  -Urology, Dr. Rush Landmark consulted on 6/30  Mechanical fall at home Right shoulder pain -Patient has some pain in right shoulder with limited range of motion -Shoulder x-ray without fracture dislocation. -Orthopedics consulted on 6/30  Well-controlled NIDDM-2 with renal complication and C5 0.9 on 6/26.  On glipizide at home. -SSI-renal -Statin as above  OSA on CPAP:  -nightly CPAP  Recent right toes amputation: Stable -Stitch removed on 6/29 here  DVT prophylaxis: SCD  due to GI bleed Code Status: Full code Family Communication: updated his wife and daughter Disposition Plan: Remains inpatient for CHF exacerbation, GI bleed and end-stage Consultants:   -Nephrology,    -Cardiology  -GI-signed off  -Urology  -Orthopedic surgery  Antimicrobials: Anti-infectives (From admission, onward)   Start     Dose/Rate Route Frequency Ordered Stop   05/14/19 1800  vancomycin (VANCOCIN) IVPB 1000 mg/200 mL premix  Status:  Discontinued     1,000 mg 200 mL/hr over 60 Minutes Intravenous Every 48 hours 05/12/19 1633 05/13/19 1119   05/13/19 1800  vancomycin (VANCOCIN) IVPB 1000 mg/200 mL premix  Status:  Discontinued     1,000 mg 200 mL/hr over 60 Minutes Intravenous Every 48 hours 05/12/19 1632 05/12/19 1633   05/13/19 1700  ceFEPIme (MAXIPIME) 2 g in sodium chloride 0.9 % 100 mL IVPB     2 g 200 mL/hr over 30 Minutes Intravenous Every 24 hours 05/12/19 1632     05/13/19 1700  doxycycline (VIBRAMYCIN) 100 mg in sodium chloride 0.9 % 250 mL IVPB     100 mg 125 mL/hr over 120 Minutes Intravenous Every 12 hours 05/13/19 1654     05/12/19 1630  vancomycin (VANCOCIN) 2,500 mg in sodium chloride 0.9 % 500 mL IVPB     2,500 mg 250 mL/hr over 120 Minutes Intravenous  Once 05/12/19 1626 05/12/19 2030   05/12/19 1615  ceFEPIme (MAXIPIME) 2 g in sodium chloride 0.9 % 100 mL IVPB     2 g 200 mL/hr over 30 Minutes Intravenous  Once 05/12/19 1604 05/12/19 1732      Scheduled Meds: . atorvastatin  20 mg Oral q1800  . calcitRIOL  0.25 mcg Oral Q M,W,F  . calcium acetate  667 mg Oral TID WC  . carvedilol  6.25 mg Oral BID WC  . guaiFENesin  600 mg Oral BID  . insulin aspart  0-9 Units Subcutaneous TID WC  . pantoprazole (PROTONIX) IV  40 mg Intravenous Q12H  .  sodium bicarbonate  650 mg Oral BID  . sodium chloride flush  3 mL Intravenous Q12H  . sodium zirconium cyclosilicate  10 g Oral Once per day on Mon Wed Fri   Continuous Infusions: . sodium chloride    .  ceFEPime (MAXIPIME) IV 2 g (05/14/19 2135)  . doxycycline (VIBRAMYCIN) IV 100 mg (05/15/19 0447)   PRN Meds:.acetaminophen **OR** acetaminophen, albuterol, nicotine polacrilex  Taye T. [place]  If 7PM-7AM, please contact night-coverage [email or web address] Password [email or web address] 05/15/2019, 10:52 AM

## 2019-05-15 NOTE — Consult Note (Signed)
Reason for Consult:Right shoulder pain Referring Physician: T Langley Louis Bradley is an 73 y.o. male.  HPI: Louis Bradley fell on Friday while trying to sit on the toilet. He had some shoulder pain at that time but it's gotten worse since then to the point where he's struggling to use that arm. He denies prior problems with that shoulder or N/T.  Past Medical History:  Diagnosis Date  . Anemia   . Anesthesia complication    General anesthesia makes me feel foggy for long periods of time  . Arthritis   . CKD (chronic kidney disease) stage V requiring chronic dialysis ([place])    Stage 4-5  . Complication of anesthesia    slow to come out of anesthesia - a lot of confusion   . Diabetes ([place])   . Fournier gangrene   . GI bleed   . Heart murmur    Born with, no problems  . History of blood transfusion    x2  . Hyperlipidemia   . Hypertension   . Moderate aortic stenosis   . NSTEMI (non-ST elevated myocardial infarction) ([place])   . Obesity   . OSA on CPAP   . Osteomyelitis ([place]) 2016   L toe  . Pulmonary hypertension ([place])   . Renal atrophy, left   . Vitamin D deficiency     Past Surgical History:  Procedure Laterality Date  . AMPUTATION TOE Left 04/20/2019   Procedure: AMPUTATION LEFT SECOND AND THIRD TOES;  Surgeon: Newt Minion, MD;  Location: [facility];  Service: Orthopedics;  Laterality: Left;  . BIOPSY  05/13/2019   Procedure: BIOPSY;  Surgeon: Laurence Spates, MD;  Location: [facility];  Service: Endoscopy;;  . COLONOSCOPY    . COLOSTOMY  03/2015  . COLOSTOMY REVERSAL    . ESOPHAGOGASTRODUODENOSCOPY N/A 05/13/2019   Procedure: ESOPHAGOGASTRODUODENOSCOPY (EGD);  Surgeon: Laurence Spates, MD;  Location: [facility] ENDOSCOPY;  Service: Endoscopy;  Laterality: N/A;  . TOE AMPUTATION Left 2016   Left big toe amputated due to MRSA infection    Family History  Problem Relation Age of Onset  . CAD Father 9  . CAD Other        2 uncles had heart attacks  . Colon cancer Neg Hx   . Prostate  cancer Neg Hx     Social History:  reports that he has quit smoking. He has never used smokeless tobacco. He reports current alcohol use. He reports that he does not use drugs.  Allergies: No Known Allergies  Medications: I have reviewed the patient's current medications.  Results for orders placed or performed during the hospital encounter of 05/12/19 (from the past 48 hour(s))  Prepare RBC     Status: None   Collection Time: 05/13/19  1:10 PM  Result Value Ref Range   Order Confirmation      ORDER PROCESSED BY BLOOD BANK Performed at [facility], [place] [address] [place], [place] [address]   Glucose, capillary     Status: Abnormal   Collection Time: 05/13/19  4:36 PM  Result Value Ref Range   Glucose-Capillary 129 (H) 70 - 99 mg/dL  Folate     Status: None   Collection Time: 05/13/19  5:04 PM  Result Value Ref Range   Folate 29.7 >5.9 ng/mL    Comment: Performed at [facility] [address], [place], [place] [address]  Vitamin B12     Status: None   Collection Time: 05/13/19  5:07  PM  Result Value Ref Range   Vitamin B-12 442 180 - 914 pg/mL    Comment: (NOTE) This assay is not validated for testing neonatal or myeloproliferative syndrome specimens for Vitamin B12 levels. Performed at [facility], [place] [address], [place], [place] [address]   Iron and TIBC     Status: Abnormal   Collection Time: 05/13/19  5:07 PM  Result Value Ref Range   Iron 30 (L) 45 - 182 ug/dL   TIBC 318 250 - 450 ug/dL   Saturation Ratios 9 (L) 17.9 - 39.5 %   UIBC 288 ug/dL    Comment: Performed at [facility], [place] [address] [place], [place] [address]  Ferritin     Status: None   Collection Time: 05/13/19  5:07 PM  Result Value Ref Range   Ferritin 205 24 - 336 ng/mL    Comment: Performed at [facility], [place] [address], [place], [place] [address]  Reticulocytes     Status: Abnormal   Collection Time: 05/13/19  5:07 PM  Result Value Ref Range   Retic Ct  Pct 4.8 (H) 0.4 - 3.1 %   RBC. 2.64 (L) 4.22 - 5.81 MIL/uL   Retic Count, Absolute 127.2 19.0 - 186.0 K/uL   Immature Retic Fract 35.4 (H) 2.3 - 15.9 %    Comment: Performed at [facility] [address], [place], [place] [address]  Hemoglobin and hematocrit, blood     Status: Abnormal   Collection Time: 05/13/19  5:07 PM  Result Value Ref Range   Hemoglobin 7.9 (L) 13.0 - 17.0 g/dL   HCT 23.8 (L) 39.0 - 52.0 %    Comment: Performed at [facility] [address], [place], [place] [address]  Glucose, capillary     Status: Abnormal   Collection Time: 05/13/19  8:58 PM  Result Value Ref Range   Glucose-Capillary 108 (H) 70 - 99 mg/dL  Magnesium     Status: Abnormal   Collection Time: 05/14/19  6:43 AM  Result Value Ref Range   Magnesium 3.0 (H) 1.7 - 2.4 mg/dL    Comment: Performed at [facility] [address], [place], [place] [address]  CBC with Differential/Platelet     Status: Abnormal   Collection Time: 05/14/19  6:43 AM  Result Value Ref Range   WBC 9.6 4.0 - 10.5 K/uL   RBC 2.36 (L) 4.22 - 5.81 MIL/uL   Hemoglobin 7.0 (L) 13.0 - 17.0 g/dL   HCT 21.6 (L) 39.0 - 52.0 %   MCV 91.5 80.0 - 100.0 fL   MCH 29.7 26.0 - 34.0 pg   MCHC 32.4 30.0 - 36.0 g/dL   RDW 15.1 11.5 - 15.5 %   Platelets 177 150 - 400 K/uL   nRBC 0.2 0.0 - 0.2 %   Neutrophils Relative % 79 %   Neutro Abs 7.8 (H) 1.7 - 7.7 K/uL   Lymphocytes Relative 8 %   Lymphs Abs 0.8 0.7 - 4.0 K/uL   Monocytes Relative 7 %   Monocytes Absolute 0.6 0.1 - 1.0 K/uL   Eosinophils Relative 4 %   Eosinophils Absolute 0.4 0.0 - 0.5 K/uL   Basophils Relative 1 %   Basophils Absolute 0.1 0.0 - 0.1 K/uL   Immature Granulocytes 1 %   Abs Immature Granulocytes 0.07 0.00 - 0.07 K/uL    Comment: Performed at [facility] [address] [place], [place]  [address]  Renal function panel     Status: Abnormal   Collection Time: 05/14/19  6:43 AM  Result Value Ref Range   Sodium 139 135 - 145 mmol/L    Potassium 4.4 3.5 - 5.1 mmol/L   Chloride 107 98 - 111 mmol/L   CO2 19 (L) 22 - 32 mmol/L   Glucose, Bld 89 70 - 99 mg/dL   BUN 146 (H) 8 - 23 mg/dL   Creatinine, Ser 5.54 (H) 0.61 - 1.24 mg/dL   Calcium 7.6 (L) 8.9 - 10.3 mg/dL   Phosphorus 6.9 (H) 2.5 - 4.6 mg/dL   Albumin 2.6 (L) 3.5 - 5.0 g/dL   GFR calc non Af Amer 9 (L) >60 mL/min   GFR calc Af Amer 11 (L) >60 mL/min   Anion gap 13 5 - 15    Comment: Performed at [facility] [address] [place], [place] [address]  Glucose, capillary     Status: None   Collection Time: 05/14/19  7:36 AM  Result Value Ref Range   Glucose-Capillary 93 70 - 99 mg/dL  Glucose, capillary     Status: Abnormal   Collection Time: 05/14/19 12:04 PM  Result Value Ref Range   Glucose-Capillary 171 (H) 70 - 99 mg/dL  Glucose, capillary     Status: Abnormal   Collection Time: 05/14/19  4:36 PM  Result Value Ref Range   Glucose-Capillary 139 (H) 70 - 99 mg/dL  Glucose, capillary     Status: Abnormal   Collection Time: 05/14/19  9:25 PM  Result Value Ref Range   Glucose-Capillary 132 (H) 70 - 99 mg/dL  Renal function panel     Status: Abnormal   Collection Time: 05/15/19  5:13 AM  Result Value Ref Range   Sodium 138 135 - 145 mmol/L   Potassium 4.8 3.5 - 5.1 mmol/L   Chloride 106 98 - 111 mmol/L   CO2 21 (L) 22 - 32 mmol/L   Glucose, Bld 117 (H) 70 - 99 mg/dL   BUN 146 (H) 8 - 23 mg/dL   Creatinine, Ser 5.38 (H) 0.61 - 1.24 mg/dL   Calcium 8.1 (L) 8.9 - 10.3 mg/dL   Phosphorus 6.4 (H) 2.5 - 4.6 mg/dL   Albumin 2.7 (L) 3.5 - 5.0 g/dL   GFR calc non Af Amer 10 (L) >60 mL/min   GFR calc Af Amer 11 (L) >60 mL/min   Anion gap 11 5 - 15    Comment: Performed at [facility], [place] [address], [place], [place] [address]  Magnesium     Status: Abnormal   Collection Time: 05/15/19  5:13 AM  Result Value Ref Range   Magnesium 2.9 (H) 1.7 - 2.4 mg/dL    Comment: Performed at [facility] [address], [place],  [address]   TSH     Status: None   Collection Time: 05/15/19  5:13 AM  Result Value Ref Range   TSH 0.965 0.350 - 4.500 uIU/mL    Comment: Performed by a 3rd Generation assay with a functional sensitivity of <=0.01 uIU/mL. Performed at [facility], [place] [address] [place] [address]   CBC     Status: Abnormal   Collection Time: 05/15/19  5:13 AM  Result Value Ref Range   WBC 9.2 4.0 - 10.5 K/uL   RBC 2.59 (L) 4.22 - 5.81 MIL/uL   Hemoglobin 7.7 (L) 13.0 - 17.0 g/dL   HCT 23.1 (L) 39.0 -  52.0 %   MCV 89.2 80.0 - 100.0 fL   MCH 29.7 26.0 - 34.0 pg   MCHC 33.3 30.0 - 36.0 g/dL   RDW 14.9 11.5 - 15.5 %   Platelets 178 150 - 400 K/uL   nRBC 0.0 0.0 - 0.2 %    Comment: Performed at [facility], [place] [address] [place], [place] [address]  Glucose, capillary     Status: Abnormal   Collection Time: 05/15/19  8:05 AM  Result Value Ref Range   Glucose-Capillary 112 (H) 70 - 99 mg/dL    Ct Abdomen Pelvis Wo Contrast  Result Date: 05/14/2019 CLINICAL DATA:  Hydronephrosis EXAM: CT ABDOMEN AND PELVIS WITHOUT CONTRAST TECHNIQUE: Multidetector CT imaging of the abdomen and pelvis was performed following the standard protocol without IV contrast. COMPARISON:  Renal ultrasound from the same day FINDINGS: Lower chest: There are moderate to large bilateral pleural effusions, right greater than left. There is interlobular septal thickening bilaterally. There is a focal airspace opacity within the right middle lobe. There are distal scattered airspace opacities throughout the remaining lower lung fields.The heart size is normal. Mitral valve calcifications are noted. Coronary artery calcifications are noted. The intracardiac blood pool is hypodense relative to the adjacent myocardium consistent with anemia. Hepatobiliary: The liver is normal. Normal gallbladder.There is no biliary ductal dilation. Pancreas: Normal contours without ductal dilatation. No peripancreatic fluid collection. Spleen: The  spleen is borderline enlarged. Adrenals/Urinary Tract: --Adrenal glands: No adrenal hemorrhage. --Right kidney/ureter: There is no right-sided hydronephrosis. There are multiple right-sided cysts, some which are hyperdense. --Left kidney/ureter: There is severe left-sided hydronephrosis that appears longstanding. There is extensive cortical thinning. This is likely secondary to a chronic left UPJ obstruction. --Urinary bladder: Unremarkable. Stomach/Bowel: --Stomach/Duodenum: No hiatal hernia or other gastric abnormality. Normal duodenal course and caliber. --Small bowel: No dilatation or inflammation. --Colon: No focal abnormality. --Appendix: Normal. Vascular/Lymphatic: Atherosclerotic calcification is present within the non-aneurysmal abdominal aorta, without hemodynamically significant stenosis. --No retroperitoneal lymphadenopathy. --No mesenteric lymphadenopathy. --No pelvic or inguinal lymphadenopathy. Reproductive: Unremarkable Other: No ascites or free air. There is a left lower quadrant abdominal wall hernia containing multiple loops of small bowel without evidence of an obstruction. Musculoskeletal. No acute displaced fractures. IMPRESSION: 1. Severe chronic left-sided hydronephrosis, likely secondary to a chronic left UPJ obstruction. 2. No right-sided hydronephrosis. 3. Moderate to large bilateral pleural effusions, right greater than left. 4. Interlobular septal thickening involving the bilateral lower lung fields is consistent with volume overload. There is scattered bilateral airspace opacities which may be secondary to pulmonary edema, however an infectious process is not excluded, especially within the right middle lobe. There is compressive atelectasis at the lung bases. 5. Left lower quadrant abdominal wall hernia containing loops of small bowel without evidence of an obstruction. Aortic Atherosclerosis (ICD10-I70.0). Electronically Signed   By: Constance Holster M.D.   On: 05/14/2019 21:01    Dg Chest 2 View  Result Date: 05/13/2019 CLINICAL DATA:  Hypoxemia EXAM: CHEST - 2 VIEW COMPARISON:  05/12/2019 FINDINGS: There is a dense, masslike airspace opacity of the perihilar right lung which is more apparent than on prior examinations, with probable small layering pleural effusions. There is underlying diffuse interstitial pulmonary opacity, likely edema. Cardiomegaly. IMPRESSION: There is a dense, masslike airspace opacity of the perihilar right lung which is more apparent than on prior examinations, with probable small layering pleural effusions. There is underlying diffuse interstitial pulmonary opacity, likely edema. Cardiomegaly. Electronically Signed   By: Eddie Candle  M.D.   On: 05/13/2019 13:06   Dg Shoulder Right  Result Date: 05/14/2019 CLINICAL DATA:  Shoulder pain.  Fall.  Arthritis. EXAM: RIGHT SHOULDER - 2+ VIEW COMPARISON:  None. FINDINGS: There is no acute displaced fracture or dislocation. There is mild-to-moderate osteoarthritis involving the right glenohumeral and right AC joints. IMPRESSION: 1. No acute osseous abnormality. 2. Mild-to-moderate osteoarthritis of the right glenohumeral and right AC joints. Electronically Signed   By: Constance Holster M.D.   On: 05/14/2019 19:29   US Renal  Result Date: 05/14/2019 CLINICAL DATA:  Acute kidney injury, history hypertension, diabetes mellitus, former smoker, stage 5 chronic kidney disease EXAM: RENAL / URINARY TRACT ULTRASOUND COMPLETE COMPARISON:  None FINDINGS: Right Kidney: Renal measurements: 12.9 x 6.6 x 7.8 cm = volume: 346 mL. Cortical thinning. Increased cortical echogenicity. No hydronephrosis or shadowing calcification. Three cysts present. Small cyst at mid kidney 2.5 x 2.0 x 2.3 cm. Cyst at inferior pole 4.6 x 6.0 x 3.6 cm. Tiny upper pole renal cyst 1.5 x 1.3 x 2.2 cm. Left Kidney: Renal measurements: 13.9 x 7.06.60 cm = volume: 308 mL. Cortical thinning. Increased cortical echogenicity. Multiple peripelvic cysts  versus moderate hydronephrosis. Question separate cyst at upper pole 4.1 x 5.1 x 4.5 cm. Bladder: Decompressed, contains minimal urine Dependent shadowing calculus within gallbladder 11 mm diameter. IMPRESSION: Cortical atrophy and medical renal disease changes of both kidneys. Three RIGHT renal cysts as above. Multiple peripelvic cysts versus moderate hydronephrosis; noncontrast CT abdomen recommended to differentiate between cystic disease and hydronephrosis. Electronically Signed   By: Lavonia Dana M.D.   On: 05/14/2019 14:40    Review of Systems  Constitutional: Negative for weight loss.  HENT: Negative for ear discharge, ear pain, hearing loss and tinnitus.   Eyes: Negative for blurred vision, double vision, photophobia and pain.  Respiratory: Negative for cough, sputum production and shortness of breath.   Cardiovascular: Negative for chest pain.  Gastrointestinal: Negative for abdominal pain, nausea and vomiting.  Genitourinary: Negative for dysuria, flank pain, frequency and urgency.  Musculoskeletal: Positive for joint pain (Right shoulder). Negative for back pain, falls, myalgias and neck pain.  Neurological: Negative for dizziness, tingling, sensory change, focal weakness, loss of consciousness and headaches.  Endo/Heme/Allergies: Does not bruise/bleed easily.  Psychiatric/Behavioral: Negative for depression, memory loss and substance abuse. The patient is not nervous/anxious.    Blood pressure 129/67, pulse (!) 56, temperature 97.8 F (36.6 C), temperature source Oral, resp. rate 19, height 6\' 3"  (1.905 m), weight 134 kg, SpO2 98 %. Physical Exam  Constitutional: He appears well-developed and well-nourished. No distress.  HENT:  Head: Normocephalic and atraumatic.  Eyes: Conjunctivae are normal. Right eye exhibits no discharge. Left eye exhibits no discharge. No scleral icterus.  Neck: Normal range of motion.  Cardiovascular: Normal rate and regular rhythm.  Respiratory: Effort  normal. No respiratory distress.  Musculoskeletal:     Comments: Right shoulder, elbow, wrist, digits- no skin wounds, mild anterior joint line TTP shoulder, active abduction severe pain, mild pain with flex/ext, mild pain with Prom, cross arm test WNL, no instability, no blocks to motion  Sens  Ax/R/M/U intact  Mot   Ax/ R/ PIN/ M/ AIN/ U intact  Rad 2+   Neurological: He is alert.  Skin: Skin is warm and dry. He is not diaphoretic.  Psychiatric: He has a normal mood and affect. His behavior is normal.    Assessment/Plan: Right shoulder pain -- Strain vs rotator cuff tear. As he is not  a surgical candidate currently I don't feel MRI is needed initially as would not change treatment. I offered steroid injection but he prefers to wait and see how PT does. Should be on scheduled NSAID if not contraindicated (e.g. Naproxen 500mg  bid). Will start gentle motion exercises and he should f/u with Dr. Sharol Given in 2 weeks or so.    Lisette Abu, PA-C Orthopedic Surgery [phone] 05/15/2019, 12:16 PM

## 2019-05-15 NOTE — Telephone Encounter (Signed)
Advised the family, they were not on dpr so I would not be able to disclose. I advised them if they have questions about a hospitalization, they should contact the unit.

## 2019-05-15 NOTE — Telephone Encounter (Signed)
New Message            Patient's wife is calling to get Dr Marlou Porch to first call Dr. Burt Knack to find out why he changed his mind about her husband's procedure, then the patient wants a call from Dr. Marlou Porch to discuss the situation.Marland Kitchen Pls advise.

## 2019-05-15 NOTE — Progress Notes (Signed)
OT Cancellation Note  Patient Details Name: Louis Bradley MRN: [id number] DOB: 12-14-1945   Cancelled Treatment:    Reason Eval/Treat Not Completed: Patient at procedure or test/ unavailable; pt being taken down for IR, will follow up for OT eval as schedule permits.   Lou Cal, OT [facility] Pager [phone] Office [phone]   Raymondo Band 05/15/2019, 12:03 PM

## 2019-05-15 NOTE — Evaluation (Addendum)
Occupational Therapy Evaluation Patient Details Name: Louis Bradley MRN: [id number] DOB: 03/07/1946 Today's Date: 05/15/2019    History of Present Illness 73 yo admitted with weakness and SOB with sepsis due to PNA and UTI. PT with anemia, CHF, progressive renal failure and AFib. Pt with recent admit 6/25-6/26 with NSTEMI and GIB at which time pt refused EGD and cardiac cath. PMHx: CKD, toe amputations, DM, AS, obesity, OSA, HTN, HLD, anemia   Clinical Impression   This 73 y/o male presents with the above. PTA pt reports independent-mod independent with ADL and functional mobility. Pt presenting with dominant RUE deficits, generalized weakness impacting his functional performance. Pt demonstrating room level mobility without AD and overall minA this session - noted pt intermittently seeking single UE support with room level distances. Pt performing standing grooming and toileting ADL with minguard assist; currently requires minA for seated UB and LB ADL. Pt also with limitations due to R shoulder pain and decreased AROM. Pt initially on supplemental O2, pt removing O2 during room level activity and O2 sats maintaining >91% on RA. He will benefit from continued acute OT services and recommend follow up [place] services to maximize his safety and  independence with ADL and mobility after discharge home.     Follow Up Recommendations  Home health OT;Supervision/Assistance - 24 hour    Equipment Recommendations  None recommended by OT           Precautions / Restrictions Precautions Precautions: Fall Restrictions Weight Bearing Restrictions: No      Mobility Bed Mobility Overal bed mobility: Modified Independent             General bed mobility comments: HOB elevated with increased effort, time and rail  Transfers Overall transfer level: Needs assistance Equipment used: None Transfers: Sit to/from Stand Sit to Stand: Supervision         General transfer comment: supervision  for lines and safety    Balance Overall balance assessment: Needs assistance Sitting-balance support: Feet supported Sitting balance-Leahy Scale: Good     Standing balance support: During functional activity;Single extremity supported;No upper extremity supported Standing balance-Leahy Scale: Fair Standing balance comment: pt able to stand without UE support but benefits from UE support for dynamic activities                           ADL either performed or assessed with clinical judgement   ADL Overall ADL's : Needs assistance/impaired Eating/Feeding: Modified independent;Sitting   Grooming: Oral care;Wash/dry face;Min guard;Minimal assistance;Standing;Wash/dry hands;Set up;Sitting Grooming Details (indicate cue type and reason): performed x2 tasks standing at sink, minguard-minA standing balance Upper Body Bathing: Min guard;Minimal assistance;Sitting   Lower Body Bathing: Minimal assistance;Sit to/from stand   Upper Body Dressing : Minimal assistance;Sitting Upper Body Dressing Details (indicate cue type and reason): assume increased assist due to RUE pain Lower Body Dressing: Minimal assistance;Sit to/from stand   Toilet Transfer: Minimal assistance;Ambulation;Regular Toilet;Grab bars   Toileting- Clothing Manipulation and Hygiene: Min guard;Sit to/from stand Toileting - Clothing Manipulation Details (indicate cue type and reason): pt voiding bladder in sitting today on toilet, minguard for safety     Functional mobility during ADLs: Minimal assistance       Vision         Perception     Praxis      Pertinent Vitals/Pain Pain Assessment: Faces Faces Pain Scale: Hurts little more Pain Location: R shoulder with ROM Pain Descriptors / Indicators: Discomfort;Grimacing Pain  Intervention(s): Monitored during session;Limited activity within patient's tolerance     Hand Dominance Right   Extremity/Trunk Assessment Upper Extremity Assessment Upper  Extremity Assessment: RUE deficits/detail RUE Deficits / Details: pt with painful RUE, reports pain present since recent fall (here at hospital); pt with limited shoulder ROM due to pain, able to achieve grossly 60* AAROM. Elbow, forearm, wrist, hand WFL RUE Coordination: decreased gross motor   Lower Extremity Assessment Lower Extremity Assessment: Defer to PT evaluation   Cervical / Trunk Assessment Cervical / Trunk Assessment: Other exceptions Cervical / Trunk Exceptions: rounded shoulders   Communication Communication Communication: No difficulties   Cognition Arousal/Alertness: Awake/alert Behavior During Therapy: WFL for tasks assessed/performed Overall Cognitive Status: Within Functional Limits for tasks assessed                                     General Comments       Exercises General Exercises - Lower Extremity Long Arc Quad: AROM;20 reps;Seated;Both Hip Flexion/Marching: AROM;20 reps;Seated;Both   Shoulder Instructions      Home Living Family/patient expects to be discharged to:: Private residence Living Arrangements: Spouse/significant other Available Help at Discharge: Family;Available 24 hours/day Type of Home: House Home Access: Level entry     Home Layout: One level     Bathroom Shower/Tub: Occupational psychologist: Handicapped height     Home Equipment: Transport planner;Shower seat - built in          Prior Functioning/Environment Level of Independence: Independent        Comments: scooter occasionally for long distance        OT Problem List: Decreased strength;Decreased range of motion;Decreased activity tolerance;Impaired balance (sitting and/or standing);Decreased safety awareness;Obesity;Impaired UE functional use;Pain      OT Treatment/Interventions: Self-care/ADL training;Therapeutic exercise;Neuromuscular education;DME and/or AE instruction;Therapeutic activities;Patient/family education;Balance training     OT Goals(Current goals can be found in the care plan section) Acute Rehab OT Goals Patient Stated Goal: return home to reading and travel OT Goal Formulation: With patient Time For Goal Achievement: 05/29/19 Potential to Achieve Goals: Good  OT Frequency: Min 2X/week   Barriers to D/C:            Co-evaluation              AM-PAC OT "6 Clicks" Daily Activity     Outcome Measure Help from another person eating meals?: None Help from another person taking care of personal grooming?: None Help from another person toileting, which includes using toliet, bedpan, or urinal?: None Help from another person bathing (including washing, rinsing, drying)?: A Little Help from another person to put on and taking off regular upper body clothing?: A Little Help from another person to put on and taking off regular lower body clothing?: A Little 6 Click Score: 21   End of Session Equipment Utilized During Treatment: Gait belt;Oxygen Nurse Communication: Mobility status  Activity Tolerance: Patient tolerated treatment well Patient left: in bed;with call bell/phone within reach;with bed alarm set  OT Visit Diagnosis: Unsteadiness on feet (R26.81);Muscle weakness (generalized) (M62.81)                Time: 2800-3491 OT Time Calculation (min): 20 min Charges:  OT General Charges $OT Visit: 1 Visit OT Evaluation $OT Eval Moderate Complexity: 1 Mod  Lou Cal, OT [employer] Pager [phone] Office [phone]   Raymondo Band 05/15/2019, 3:34 PM

## 2019-05-15 NOTE — Care Management Important Message (Signed)
Important Message  Patient Details  Name: Louis Bradley MRN: [id number] Date of Birth: August 26, 1946   Medicare Important Message Given:  Yes     Shelda Altes 05/15/2019, 12:44 PM

## 2019-05-16 ENCOUNTER — Encounter (HOSPITAL_COMMUNITY)
Admission: EM | Disposition: A | Discharge status: Home Health | Payer: Self-pay | Source: Home / Self Care | Attending: Student

## 2019-05-16 ENCOUNTER — Inpatient Hospital Stay (HOSPITAL_COMMUNITY): Payer: Medicare Other | Admitting: Certified Registered"

## 2019-05-16 ENCOUNTER — Encounter (HOSPITAL_COMMUNITY): Payer: Self-pay | Admitting: Orthopedic Surgery

## 2019-05-16 ENCOUNTER — Inpatient Hospital Stay (HOSPITAL_COMMUNITY): Payer: Medicare Other

## 2019-05-16 ENCOUNTER — Ambulatory Visit: Payer: Medicare Other | Admitting: Family

## 2019-05-16 DIAGNOSIS — N185 Chronic kidney disease, stage 5: Secondary | ICD-10-CM | POA: Diagnosis not present

## 2019-05-16 DIAGNOSIS — A419 Sepsis, unspecified organism: Principal | ICD-10-CM

## 2019-05-16 HISTORY — PX: INSERTION OF DIALYSIS CATHETER: SHX1324

## 2019-05-16 HISTORY — PX: AV FISTULA PLACEMENT: SHX1204

## 2019-05-16 LAB — GLUCOSE, CAPILLARY
Glucose-Capillary: 108 mg/dL — ABNORMAL HIGH (ref 70–99)
Glucose-Capillary: 137 mg/dL — ABNORMAL HIGH (ref 70–99)
Glucose-Capillary: 129 mg/dL — ABNORMAL HIGH (ref 70–99)
Glucose-Capillary: 126 mg/dL — ABNORMAL HIGH (ref 70–99)
Glucose-Capillary: 150 mg/dL — ABNORMAL HIGH (ref 70–99)

## 2019-05-16 LAB — BPAM RBC
Blood Product Expiration Date: 202007212359
Blood Product Expiration Date: 202007282359
Blood Product Expiration Date: 202007282359
Blood Product Expiration Date: 202007282359
ISSUE DATE / TIME: 202006272043
ISSUE DATE / TIME: 202006281402
ISSUE DATE / TIME: 202006291530
ISSUE DATE / TIME: 202006300944
Unit Type and Rh: 5100
Unit Type and Rh: 5100
Unit Type and Rh: 5100
Unit Type and Rh: 5100

## 2019-05-16 LAB — RENAL FUNCTION PANEL
Albumin: 2.8 g/dL — ABNORMAL LOW (ref 3.5–5.0)
Anion gap: 15 (ref 5–15)
BUN: 138 mg/dL — ABNORMAL HIGH (ref 8–23)
CO2: 21 mmol/L — ABNORMAL LOW (ref 22–32)
Calcium: 8.6 mg/dL — ABNORMAL LOW (ref 8.9–10.3)
Chloride: 104 mmol/L (ref 98–111)
Creatinine, Ser: 5.03 mg/dL — ABNORMAL HIGH (ref 0.61–1.24)
GFR calc Af Amer: 12 mL/min — ABNORMAL LOW (ref >60)
GFR calc non Af Amer: 11 mL/min — ABNORMAL LOW (ref >60)
Glucose, Bld: 118 mg/dL — ABNORMAL HIGH (ref 70–99)
Phosphorus: 5.5 mg/dL — ABNORMAL HIGH (ref 2.5–4.6)
Potassium: 4.3 mmol/L (ref 3.5–5.1)
Sodium: 140 mmol/L (ref 135–145)

## 2019-05-16 LAB — CBC
HCT: 24.9 % — ABNORMAL LOW (ref 39.0–52.0)
Hemoglobin: 8.2 g/dL — ABNORMAL LOW (ref 13.0–17.0)
MCH: 29 pg (ref 26.0–34.0)
MCHC: 32.9 g/dL (ref 30.0–36.0)
MCV: 88 fL (ref 80.0–100.0)
Platelets: 195 10*3/uL (ref 150–400)
RBC: 2.83 MIL/uL — ABNORMAL LOW (ref 4.22–5.81)
RDW: 14.7 % (ref 11.5–15.5)
WBC: 8.6 10*3/uL (ref 4.0–10.5)
nRBC: 0 % (ref 0.0–0.2)

## 2019-05-16 LAB — TYPE AND SCREEN
ABO/RH(D): O POS
Antibody Screen: NEGATIVE
Unit division: 0
Unit division: 0
Unit division: 0
Unit division: 0

## 2019-05-16 LAB — ACID FAST SMEAR (AFB, MYCOBACTERIA): Acid Fast Smear: NEGATIVE

## 2019-05-16 LAB — MAGNESIUM: Magnesium: 3.3 mg/dL — ABNORMAL HIGH (ref 1.7–2.4)

## 2019-05-16 LAB — SURGICAL PCR SCREEN
MRSA, PCR: NEGATIVE
Staphylococcus aureus: NEGATIVE

## 2019-05-16 LAB — PTH, INTACT AND CALCIUM
Calcium, Total (PTH): 7.9 mg/dL — ABNORMAL LOW (ref 8.6–10.2)
PTH: 127 pg/mL — ABNORMAL HIGH (ref 15–65)

## 2019-05-16 SURGERY — INSERTION OF DIALYSIS CATHETER
Anesthesia: Monitor Anesthesia Care | Site: Chest | Laterality: Right

## 2019-05-16 MED ORDER — 0.9 % SODIUM CHLORIDE (POUR BTL) OPTIME
TOPICAL | Status: DC | PRN
  Administered 2019-05-16: 1000 mL

## 2019-05-16 MED ORDER — HEMOSTATIC AGENTS (NO CHARGE) OPTIME
TOPICAL | Status: DC | PRN
  Administered 2019-05-16: 1 via TOPICAL

## 2019-05-16 MED ORDER — PROPOFOL 500 MG/50ML IV EMUL
INTRAVENOUS | Status: DC | PRN
  Administered 2019-05-16: 50 ug/kg/min via INTRAVENOUS

## 2019-05-16 MED ORDER — ONDANSETRON HCL 4 MG/2ML IJ SOLN
INTRAMUSCULAR | Status: AC
  Filled 2019-05-16: qty 6

## 2019-05-16 MED ORDER — CHLORHEXIDINE GLUCONATE CLOTH 2 % EX PADS: 6.0000 | MEDICATED_PAD | Freq: Every day | CUTANEOUS | Status: DC

## 2019-05-16 MED ORDER — SODIUM CHLORIDE 0.9 % IV SOLN
INTRAVENOUS | Status: AC
  Filled 2019-05-16: qty 1.2

## 2019-05-16 MED ORDER — SODIUM CHLORIDE 0.9 % IV SOLN
1.5000 g | INTRAVENOUS | Status: AC
  Administered 2019-05-16: 1.5 g via INTRAVENOUS
  Filled 2019-05-16: qty 1.5

## 2019-05-16 MED ORDER — LIDOCAINE-EPINEPHRINE (PF) 1 %-1:200000 IJ SOLN
INTRAMUSCULAR | Status: AC
  Filled 2019-05-16: qty 30

## 2019-05-16 MED ORDER — ONDANSETRON HCL 4 MG/2ML IJ SOLN
INTRAMUSCULAR | Status: DC | PRN
  Administered 2019-05-16: 4 mg via INTRAVENOUS

## 2019-05-16 MED ORDER — LIDOCAINE HCL (PF) 1 % IJ SOLN
INTRAMUSCULAR | Status: AC
  Filled 2019-05-16: qty 30

## 2019-05-16 MED ORDER — PHENYLEPHRINE 40 MCG/ML (10ML) SYRINGE FOR IV PUSH (FOR BLOOD PRESSURE SUPPORT)
PREFILLED_SYRINGE | INTRAVENOUS | Status: AC
  Filled 2019-05-16: qty 10

## 2019-05-16 MED ORDER — LIDOCAINE 2% (20 MG/ML) 5 ML SYRINGE
INTRAMUSCULAR | Status: DC | PRN
  Administered 2019-05-16: 40 mg via INTRAVENOUS

## 2019-05-16 MED ORDER — FENTANYL CITRATE (PF) 250 MCG/5ML IJ SOLN
INTRAMUSCULAR | Status: AC
  Filled 2019-05-16: qty 5

## 2019-05-16 MED ORDER — ROCURONIUM BROMIDE 10 MG/ML (PF) SYRINGE
PREFILLED_SYRINGE | INTRAVENOUS | Status: AC
  Filled 2019-05-16: qty 10

## 2019-05-16 MED ORDER — SODIUM CHLORIDE 0.9 % IV SOLN
INTRAVENOUS | Status: DC | PRN
  Administered 2019-05-16: 500 mL

## 2019-05-16 MED ORDER — FENTANYL CITRATE (PF) 100 MCG/2ML IJ SOLN
INTRAMUSCULAR | Status: DC | PRN
  Administered 2019-05-16: 50 ug via INTRAVENOUS
  Administered 2019-05-16: 25 ug via INTRAVENOUS

## 2019-05-16 MED ORDER — HEPARIN SODIUM (PORCINE) 1000 UNIT/ML IJ SOLN
INTRAMUSCULAR | Status: DC | PRN
  Administered 2019-05-16: 3400 [IU] via INTRAVENOUS

## 2019-05-16 MED ORDER — SODIUM CHLORIDE 0.9 % IV SOLN
INTRAVENOUS | Status: DC
  Administered 2019-05-16 (×2): via INTRAVENOUS

## 2019-05-16 MED ORDER — SODIUM CHLORIDE 0.9 % IV SOLN
INTRAVENOUS | Status: DC | PRN
  Administered 2019-05-16: 10 ug/min via INTRAVENOUS

## 2019-05-16 MED ORDER — LIDOCAINE 2% (20 MG/ML) 5 ML SYRINGE
INTRAMUSCULAR | Status: AC
  Filled 2019-05-16: qty 15

## 2019-05-16 MED ORDER — LIDOCAINE-EPINEPHRINE (PF) 1 %-1:200000 IJ SOLN
INTRAMUSCULAR | Status: DC | PRN
  Administered 2019-05-16: 25 mL

## 2019-05-16 MED ORDER — HEPARIN SODIUM (PORCINE) 1000 UNIT/ML IJ SOLN
INTRAMUSCULAR | Status: AC
  Filled 2019-05-16: qty 1

## 2019-05-16 MED ORDER — OXYCODONE-ACETAMINOPHEN 5-325 MG PO TABS: 1.0000 | ORAL_TABLET | Freq: Four times a day (QID) | ORAL | Status: DC | PRN

## 2019-05-16 SURGICAL SUPPLY — 55 items
ARMBAND PINK RESTRICT EXTREMIT (MISCELLANEOUS) ×3 IMPLANT
BAG DECANTER FOR FLEXI CONT (MISCELLANEOUS) ×3 IMPLANT
BIOPATCH RED 1 DISK 7.0 (GAUZE/BANDAGES/DRESSINGS) ×3 IMPLANT
CANISTER SUCT 3000ML PPV (MISCELLANEOUS) ×3 IMPLANT
CATH PALINDROME RT-P 15FX19CM (CATHETERS) IMPLANT
CATH PALINDROME RT-P 15FX23CM (CATHETERS) ×3 IMPLANT
CATH PALINDROME RT-P 15FX28CM (CATHETERS) IMPLANT
CATH PALINDROME RT-P 15FX55CM (CATHETERS) IMPLANT
CLIP VESOCCLUDE MED 6/CT (CLIP) ×3 IMPLANT
CLIP VESOCCLUDE SM WIDE 6/CT (CLIP) ×3 IMPLANT
COVER PROBE W GEL 5X96 (DRAPES) ×6 IMPLANT
COVER SURGICAL LIGHT HANDLE (MISCELLANEOUS) ×3 IMPLANT
COVER WAND RF STERILE (DRAPES) ×3 IMPLANT
DERMABOND ADVANCED (GAUZE/BANDAGES/DRESSINGS) ×1
DERMABOND ADVANCED .7 DNX12 (GAUZE/BANDAGES/DRESSINGS) ×2 IMPLANT
DRAPE C-ARM 42X72 X-RAY (DRAPES) ×3 IMPLANT
DRAPE CHEST BREAST 15X10 FENES (DRAPES) ×3 IMPLANT
ELECT REM PT RETURN 9FT ADLT (ELECTROSURGICAL) ×3
ELECTRODE REM PT RTRN 9FT ADLT (ELECTROSURGICAL) ×2 IMPLANT
GAUZE 4X4 16PLY RFD (DISPOSABLE) ×3 IMPLANT
GLOVE BIO SURGEON STRL SZ 6 (GLOVE) ×3 IMPLANT
GLOVE BIOGEL PI IND STRL 6.5 (GLOVE) ×6 IMPLANT
GLOVE BIOGEL PI IND STRL 7.5 (GLOVE) ×2 IMPLANT
GLOVE BIOGEL PI INDICATOR 6.5 (GLOVE) ×3
GLOVE BIOGEL PI INDICATOR 7.5 (GLOVE) ×1
GLOVE ECLIPSE 6.5 STRL STRAW (GLOVE) ×9 IMPLANT
GLOVE SURG SS PI 6.5 STRL IVOR (GLOVE) ×3 IMPLANT
GLOVE SURG SS PI 7.5 STRL IVOR (GLOVE) ×3 IMPLANT
GOWN STRL REUS W/ TWL LRG LVL3 (GOWN DISPOSABLE) ×4 IMPLANT
GOWN STRL REUS W/ TWL XL LVL3 (GOWN DISPOSABLE) ×2 IMPLANT
GOWN STRL REUS W/TWL LRG LVL3 (GOWN DISPOSABLE) ×2
GOWN STRL REUS W/TWL XL LVL3 (GOWN DISPOSABLE) ×1
HEMOSTAT SNOW SURGICEL 2X4 (HEMOSTASIS) ×3 IMPLANT
KIT BASIN OR (CUSTOM PROCEDURE TRAY) ×3 IMPLANT
KIT TURNOVER KIT B (KITS) ×3 IMPLANT
NEEDLE 18GX1X1/2 (RX/OR ONLY) (NEEDLE) ×3 IMPLANT
NEEDLE HYPO 25GX1X1/2 BEV (NEEDLE) ×3 IMPLANT
NS IRRIG 1000ML POUR BTL (IV SOLUTION) ×3 IMPLANT
PACK CV ACCESS (CUSTOM PROCEDURE TRAY) ×3 IMPLANT
PAD ARMBOARD 7.5X6 YLW CONV (MISCELLANEOUS) ×6 IMPLANT
SOAP 2 % CHG 4 OZ (WOUND CARE) ×3 IMPLANT
SUT ETHILON 3 0 PS 1 (SUTURE) ×3 IMPLANT
SUT PROLENE 6 0 CC (SUTURE) ×3 IMPLANT
SUT VIC AB 3-0 SH 27 (SUTURE) ×1
SUT VIC AB 3-0 SH 27X BRD (SUTURE) ×2 IMPLANT
SUT VIC AB 4-0 PS2 18 (SUTURE) ×3 IMPLANT
SUT VICRYL 4-0 PS2 18IN ABS (SUTURE) ×3 IMPLANT
SYR 10ML LL (SYRINGE) ×3 IMPLANT
SYR 20CC LL (SYRINGE) ×6 IMPLANT
SYR 5ML LL (SYRINGE) ×3 IMPLANT
SYR CONTROL 10ML LL (SYRINGE) ×3 IMPLANT
TOWEL GREEN STERILE (TOWEL DISPOSABLE) ×6 IMPLANT
TOWEL GREEN STERILE FF (TOWEL DISPOSABLE) ×3 IMPLANT
UNDERPAD 30X30 (UNDERPADS AND DIAPERS) ×3 IMPLANT
WATER STERILE IRR 1000ML POUR (IV SOLUTION) ×3 IMPLANT

## 2019-05-16 NOTE — Anesthesia Preprocedure Evaluation (Addendum)
Anesthesia Evaluation  Patient identified by MRN, date of birth, ID band Patient awake    Reviewed: Allergy & Precautions, NPO status , Patient's Chart, lab work & pertinent test results  History of Anesthesia Complications (+) history of anesthetic complications  Airway Mallampati: III  TM Distance: >3 FB Neck ROM: Full    Dental  (+) Dental Advisory Given   Pulmonary sleep apnea , pneumonia, former smoker,    Pulmonary exam normal breath sounds clear to auscultation       Cardiovascular hypertension, Pt. on home beta blockers and Pt. on medications Normal cardiovascular exam+ Valvular Problems/Murmurs AS  Rhythm:Regular Rate:Normal  Echo 05/11/2019  1. The left ventricle has a visually estimated ejection fraction of 50%. The cavity size was mildly dilated. Left ventricular diastolic Doppler parameters are consistent with pseudonormalization. Elevated left ventricular end-diastolic pressure.  Hypokinesis of the apical anterior wall, the apical septal wall, and the true apex.  2. The right ventricle has normal systolic function. The cavity was normal. There is no increase in right ventricular wall thickness.  3. Left atrial size was moderately dilated.  4. There is moderate mitral annular calcification present. No evidence of mitral valve stenosis. Mild mitral regurgitation.  5. The aortic valve is tricuspid. Moderate calcification of the aortic valve. Moderate stenosis of the aortic valve. Mean gradient 33 mmHg with AVA 1.29 cm^2.  6. The aortic root is normal in size and structure.  7. Right atrial size was mildly dilated.  8. Trivial pericardial effusion is present.  9. The inferior vena cava was dilated in size with <50% respiratory variability. PA systolic pressure 53 mmHg.   Neuro/Psych negative neurological ROS  negative psych ROS   GI/Hepatic negative GI ROS, Neg liver ROS,   Endo/Other  negative endocrine ROSdiabetes  Renal/GU Renal Insufficiency and CRFRenal disease     Musculoskeletal  (+) Arthritis , Osteoarthritis,    Abdominal   Peds  Hematology  (+) anemia ,   Anesthesia Other Findings   Reproductive/Obstetrics                            Lab Results  Component Value Date   WBC 8.6 05/16/2019   HGB 8.2 (L) 05/16/2019   HCT 24.9 (L) 05/16/2019   MCV 88.0 05/16/2019   PLT 195 05/16/2019   Lab Results  Component Value Date   CREATININE 5.03 (H) 05/16/2019   BUN 138 (H) 05/16/2019   NA 140 05/16/2019   K 4.3 05/16/2019   CL 104 05/16/2019   CO2 21 (L) 05/16/2019    Anesthesia Physical  Anesthesia Plan  ASA: IV  Anesthesia Plan: MAC   Post-op Pain Management:    Induction: Intravenous  PONV Risk Score and Plan: 2 and Treatment may vary due to age or medical condition, Ondansetron and Propofol infusion  Airway Management Planned: Natural Airway and Simple Face Mask  Additional Equipment:   Intra-op Plan:   Post-operative Plan:   Informed Consent: I have reviewed the patients History and Physical, chart, labs and discussed the procedure including the risks, benefits and alternatives for the proposed anesthesia with the patient or authorized representative who has indicated his/her understanding and acceptance.     Dental advisory given  Plan Discussed with: CRNA  Anesthesia Plan Comments:        Anesthesia Quick Evaluation

## 2019-05-16 NOTE — Progress Notes (Addendum)
[facility] NEPHROLOGY PROGRESS NOTE  Assessment/ Plan: Pt is a 73 y.o. yo male with history of hypertension, OSA, recent hospitalization for GI bleeding/NSTEMI presented with SOB and melena.  In ER patient was hypotensive to 80s, hemoglobin 6.9 and creatinine level of 5.6.  We are consulted for CKD management.  # CKD stage IV/V: Acute rise in creatinine probably due to hypotensive episode in ER,  anemia and left kidney obstruction.  For CKD he follows with Dr. Royce Macadamia at [facility] and already had upper extremity pain mapping done.  The CT scan showed severe chronic left-sided hydronephrosis seen by urology.  He is nonoliguric however has some early uremic features.   -The patient spoke with his nephrologist yesterday. After discussion, he agreed to initiate dialysis.  Patient is currently n.p.o.  I have consulted Dr. Donnetta Hutching for AV fistula creation and tunneled catheter.  Patient may go to OR today and start dialysis.  Vein mapping is already done.  #Hyperkalemia: Potassium level normal.  He is on Lokelma 3 times a week.  #Severe chronic left-sided hydronephrosis likely due to chronic left UPJ obstruction: Seen by urologist, chronic in nature.  No intervention.  #Hypertension/volume: Blood pressure acceptable.  Continue Coreg.  Monitor blood pressure.  #Anemia due to GI bleed and CKD: Receiving blood transfusion.  Iron saturation 9 therefore received a dose of Feraheme and ESA on 6/29.  #Metabolic bone disease: PTH level 127.  Continue PhosLo and calcitriol.  #Metabolic acidosis.  discontinue oral sodium bicarbonate once start dialysis.  #Respiratory failure possible HCAP: On cefepime and doxy per primary team.  #CHF/pulmonary hypertension: Diuretics as above.  Seen by cardiology, no intervention at this time.  Subjective: Seen and examined at bedside.  No new event.  Agreed for initiation of dialysis.  Denied chest pain, shortness of breath.  Reports mild nausea.  No  vomiting.  Objective Vital signs in last 24 hours: Vitals:   05/15/19 1748 05/15/19 2000 05/15/19 2057 05/16/19 0500  BP: 126/82  135/62 128/63  Pulse:  (!) 56 (!) 54 (!) 55  Resp:  15 18 17   Temp:   97.6 F (36.4 C) 97.9 F (36.6 C)  TempSrc:   Oral Axillary  SpO2:  96% 95% 96%  Weight:    130.9 kg  Height:       Weight change: -3.1 kg  Intake/Output Summary (Last 24 hours) at 05/16/2019 0835 Last data filed at 05/16/2019 4098 Gross per 24 hour  Intake 418 ml  Output 3925 ml  Net -3507 ml       Labs: Basic Metabolic Panel: Recent Labs  Lab 05/14/19 0643 05/15/19 0513 05/16/19 0500  NA 139 138 140  K 4.4 4.8 4.3  CL 107 106 104  CO2 19* 21* 21*  GLUCOSE 89 117* 118*  BUN 146* 146* 138*  CREATININE 5.54* 5.38* 5.03*  CALCIUM 7.6* 8.1*  7.9* 8.6*  PHOS 6.9* 6.4* 5.5*   Liver Function Tests: Recent Labs  Lab 05/10/19 1924 05/12/19 1434  05/14/19 0643 05/15/19 0513 05/16/19 0500  AST 18 13*  --   --   --   --   ALT 19 15  --   --   --   --   ALKPHOS 52 50  --   --   --   --   BILITOT 0.5 0.4  --   --   --   --   PROT 6.1* 5.8*  --   --   --   --  ALBUMIN 3.2* 3.0*   < > 2.6* 2.7* 2.8*   < > = values in this interval not displayed.   No results for input(s): LIPASE, AMYLASE in the last 168 hours. No results for input(s): AMMONIA in the last 168 hours. CBC: Recent Labs  Lab 05/12/19 1434 05/13/19 1153  05/14/19 0643 05/15/19 0513 05/15/19 1434 05/16/19 0500  WBC 12.5* 10.9*  --  9.6 9.2  --  8.6  NEUTROABS 11.3*  --   --  7.8*  --   --   --   HGB 6.9* 6.8*   < > 7.0* 7.7* 8.6* 8.2*  HCT 21.7* 20.9*   < > 21.6* 23.1* 25.9* 24.9*  MCV 91.6 90.5  --  91.5 89.2  --  88.0  PLT 181 170  --  177 178  --  195   < > = values in this interval not displayed.   Cardiac Enzymes: No results for input(s): CKTOTAL, CKMB, CKMBINDEX, TROPONINI in the last 168 hours. CBG: Recent Labs  Lab 05/15/19 0805 05/15/19 1326 05/15/19 1642 05/15/19 2230  05/16/19 0744  GLUCAP 112* 132* 157* 104* 108*    Iron Studies:  Recent Labs    05/13/19 1707  IRON 30*  TIBC 318  FERRITIN 205   Studies/Results: Ct Abdomen Pelvis Wo Contrast  Result Date: 05/14/2019 CLINICAL DATA:  Hydronephrosis EXAM: CT ABDOMEN AND PELVIS WITHOUT CONTRAST TECHNIQUE: Multidetector CT imaging of the abdomen and pelvis was performed following the standard protocol without IV contrast. COMPARISON:  Renal ultrasound from the same day FINDINGS: Lower chest: There are moderate to large bilateral pleural effusions, right greater than left. There is interlobular septal thickening bilaterally. There is a focal airspace opacity within the right middle lobe. There are distal scattered airspace opacities throughout the remaining lower lung fields.The heart size is normal. Mitral valve calcifications are noted. Coronary artery calcifications are noted. The intracardiac blood pool is hypodense relative to the adjacent myocardium consistent with anemia. Hepatobiliary: The liver is normal. Normal gallbladder.There is no biliary ductal dilation. Pancreas: Normal contours without ductal dilatation. No peripancreatic fluid collection. Spleen: The spleen is borderline enlarged. Adrenals/Urinary Tract: --Adrenal glands: No adrenal hemorrhage. --Right kidney/ureter: There is no right-sided hydronephrosis. There are multiple right-sided cysts, some which are hyperdense. --Left kidney/ureter: There is severe left-sided hydronephrosis that appears longstanding. There is extensive cortical thinning. This is likely secondary to a chronic left UPJ obstruction. --Urinary bladder: Unremarkable. Stomach/Bowel: --Stomach/Duodenum: No hiatal hernia or other gastric abnormality. Normal duodenal course and caliber. --Small bowel: No dilatation or inflammation. --Colon: No focal abnormality. --Appendix: Normal. Vascular/Lymphatic: Atherosclerotic calcification is present within the non-aneurysmal abdominal aorta,  without hemodynamically significant stenosis. --No retroperitoneal lymphadenopathy. --No mesenteric lymphadenopathy. --No pelvic or inguinal lymphadenopathy. Reproductive: Unremarkable Other: No ascites or free air. There is a left lower quadrant abdominal wall hernia containing multiple loops of small bowel without evidence of an obstruction. Musculoskeletal. No acute displaced fractures. IMPRESSION: 1. Severe chronic left-sided hydronephrosis, likely secondary to a chronic left UPJ obstruction. 2. No right-sided hydronephrosis. 3. Moderate to large bilateral pleural effusions, right greater than left. 4. Interlobular septal thickening involving the bilateral lower lung fields is consistent with volume overload. There is scattered bilateral airspace opacities which may be secondary to pulmonary edema, however an infectious process is not excluded, especially within the right middle lobe. There is compressive atelectasis at the lung bases. 5. Left lower quadrant abdominal wall hernia containing loops of small bowel without evidence of an obstruction. Aortic Atherosclerosis (ICD10-I70.0).  Electronically Signed   By: Constance Holster M.D.   On: 05/14/2019 21:01   Dg Chest 1 View  Result Date: 05/15/2019 CLINICAL DATA:  S/p right thoracentesis yielding 800 mL clear yellow fluid. EXAM: CHEST  1 VIEW COMPARISON:  05/13/2019 FINDINGS: Status post right thoracentesis. Bilateral mild interstitial thickening. Improved right perihilar airspace disease. No significant pleural effusion. No pneumothorax. Stable cardiomegaly. No acute osseous abnormality. IMPRESSION: 1. No right pneumothorax status post thoracentesis. 2. Cardiomegaly with mild pulmonary vascular congestion. Electronically Signed   By: Kathreen Devoid   On: 05/15/2019 14:23   Dg Shoulder Right  Result Date: 05/14/2019 CLINICAL DATA:  Shoulder pain.  Fall.  Arthritis. EXAM: RIGHT SHOULDER - 2+ VIEW COMPARISON:  None. FINDINGS: There is no acute displaced  fracture or dislocation. There is mild-to-moderate osteoarthritis involving the right glenohumeral and right AC joints. IMPRESSION: 1. No acute osseous abnormality. 2. Mild-to-moderate osteoarthritis of the right glenohumeral and right AC joints. Electronically Signed   By: Constance Holster M.D.   On: 05/14/2019 19:29   US Renal  Result Date: 05/14/2019 CLINICAL DATA:  Acute kidney injury, history hypertension, diabetes mellitus, former smoker, stage 5 chronic kidney disease EXAM: RENAL / URINARY TRACT ULTRASOUND COMPLETE COMPARISON:  None FINDINGS: Right Kidney: Renal measurements: 12.9 x 6.6 x 7.8 cm = volume: 346 mL. Cortical thinning. Increased cortical echogenicity. No hydronephrosis or shadowing calcification. Three cysts present. Small cyst at mid kidney 2.5 x 2.0 x 2.3 cm. Cyst at inferior pole 4.6 x 6.0 x 3.6 cm. Tiny upper pole renal cyst 1.5 x 1.3 x 2.2 cm. Left Kidney: Renal measurements: 13.9 x 7.06.60 cm = volume: 308 mL. Cortical thinning. Increased cortical echogenicity. Multiple peripelvic cysts versus moderate hydronephrosis. Question separate cyst at upper pole 4.1 x 5.1 x 4.5 cm. Bladder: Decompressed, contains minimal urine Dependent shadowing calculus within gallbladder 11 mm diameter. IMPRESSION: Cortical atrophy and medical renal disease changes of both kidneys. Three RIGHT renal cysts as above. Multiple peripelvic cysts versus moderate hydronephrosis; noncontrast CT abdomen recommended to differentiate between cystic disease and hydronephrosis. Electronically Signed   By: Lavonia Dana M.D.   On: 05/14/2019 14:40   Ir Thoracentesis Asp Pleural Space W/img Guide  Result Date: 05/15/2019 INDICATION: Patient with history of CKD, pulmonary HTN, CHF with complaints of dyspnea requiring supplemental oxygen. Imaging shows bilateral pleural effusions - request to IR for diagnostic and therapeutic thoracentesis. EXAM: ULTRASOUND GUIDED RIGHT THORACENTESIS MEDICATIONS: 7 mL 1% lidocaine  COMPLICATIONS: None immediate. PROCEDURE: An ultrasound guided thoracentesis was thoroughly discussed with the patient and questions answered. The benefits, risks, alternatives and complications were also discussed. The patient understands and wishes to proceed with the procedure. Written consent was obtained. Ultrasound was performed to localize and mark an adequate pocket of fluid in the right chest. The area was then prepped and draped in the normal sterile fashion. 1% Lidocaine was used for local anesthesia. Under ultrasound guidance a 6 Fr Safe-T-Centesis catheter was introduced. Thoracentesis was performed. The catheter was removed and a dressing applied. FINDINGS: A total of approximately 800 mL of clear yellow fluid was removed. Samples were sent to the laboratory as requested by the clinical team. IMPRESSION: Successful ultrasound guided right thoracentesis yielding 800 mL of pleural fluid. Read by Candiss Norse, PA-C Electronically Signed   By: Corrie Mckusick D.O.   On: 05/15/2019 13:10    Medications: Infusions: . ceFEPime (MAXIPIME) IV 2 g (05/15/19 1748)  . doxycycline (VIBRAMYCIN) IV 100 mg (05/16/19 0522)  Scheduled Medications: . atorvastatin  20 mg Oral q1800  . calcitRIOL  0.25 mcg Oral Q M,W,F  . calcium acetate  667 mg Oral TID WC  . carvedilol  6.25 mg Oral BID WC  . furosemide  80 mg Oral BID  . guaiFENesin  600 mg Oral BID  . insulin aspart  0-9 Units Subcutaneous TID WC  . pantoprazole (PROTONIX) IV  40 mg Intravenous Q12H  . sodium bicarbonate  650 mg Oral BID  . sodium chloride flush  3 mL Intravenous Q12H  . sodium zirconium cyclosilicate  10 g Oral Once per day on Mon Wed Fri    have reviewed scheduled and prn medications.  Physical Exam: General: Not in distress, comfortable Heart:RRR, s1s2 nl, no rubs Lungs: clear b/l, no wheezing Abdomen:soft, Non-tender, non-distended Extremities: Lower extremity edema trace Neurology: Alert, awake, following  commands.  Dron Prasad Bhandari 05/16/2019,8:35 AM  LOS: 4 days  Pager: [phone]

## 2019-05-16 NOTE — Anesthesia Procedure Notes (Signed)
Procedure Name: MAC Date/Time: 05/16/2019 1:09 PM Performed by: Neldon Newport, CRNA Pre-anesthesia Checklist: Timeout performed, Patient being monitored, Suction available, Emergency Drugs available and Patient identified Patient Re-evaluated:Patient Re-evaluated prior to induction Oxygen Delivery Method: Simple face mask Placement Confirmation: positive ETCO2

## 2019-05-16 NOTE — Discharge Instructions (Signed)
° °  [facility]  Discharge Instructions  AV Fistula or Graft Surgery for Dialysis Access  Please refer to the following instructions for your post-procedure care. Your surgeon or physician assistant will discuss any changes with you.  Activity  You may drive the day following your surgery, if you are comfortable and no longer taking prescription pain medication. Resume full activity as the soreness in your incision resolves.  Bathing/Showering  You may shower after you go home. Keep your incision dry for 48 hours. Do not soak in a bathtub, hot tub, or swim until the incision heals completely. You may not shower if you have a hemodialysis catheter.  Incision Care  Clean your incision with mild soap and water after 48 hours. Pat the area dry with a clean towel. You do not need a bandage unless otherwise instructed. Do not apply any ointments or creams to your incision. You may have skin glue on your incision. Do not peel it off. It will come off on its own in about one week. Your arm may swell a bit after surgery. To reduce swelling use pillows to elevate your arm so it is above your heart. Your doctor will tell you if you need to lightly wrap your arm with an ACE bandage.  Diet  Resume your normal diet. There are not special food restrictions following this procedure. In order to heal from your surgery, it is CRITICAL to get adequate nutrition. Your body requires vitamins, minerals, and protein. Vegetables are the best source of vitamins and minerals. Vegetables also provide the perfect balance of protein. Processed food has little nutritional value, so try to avoid this.  Medications  Resume taking all of your medications. If your incision is causing pain, you may take over-the counter pain relievers such as acetaminophen (Tylenol). If you were prescribed a stronger pain medication, please be aware these medications can cause nausea and constipation. Prevent  nausea by taking the medication with a snack or meal. Avoid constipation by drinking plenty of fluids and eating foods with high amount of fiber, such as fruits, vegetables, and grains.  Do not take Tylenol if you are taking prescription pain medications.  Follow up Your surgeon may want to see you in the office following your access surgery. If so, this will be arranged at the time of your surgery.  Please call us immediately for any of the following conditions:  Increased pain, redness, drainage (pus) from your incision site Fever of 101 degrees or higher Severe or worsening pain at your incision site Hand pain or numbness.  Reduce your risk of vascular disease:  Stop smoking. If you would like help, call [facility] at 1-800-QUIT-NOW 704-588-7842) or Louis Bradley at [place] your cholesterol Maintain a desired weight Control your diabetes Keep your blood pressure down  Dialysis  It will take several weeks to several months for your new dialysis access to be ready for use. Your surgeon will determine when it is okay to use it. Your nephrologist will continue to direct your dialysis. You can continue to use your Permcath until your new access is ready for use.   05/16/2019 Louis Bradley [id number] May 21, 1946  Surgeon(s): Louis Mitchell, MD  Procedure(s): INSERTION OF DIALYSIS CATHETER ARTERIOVENOUS (AV) FISTULA CREATION  x Do not stick fistula for 12 weeks    If you have any questions, please call the office at [phone].

## 2019-05-16 NOTE — Consult Note (Addendum)
[facility] NOTE   MRN : [id number]  Reason for Consult: ESRD  Referring Physician: Nephrology  History of Present Illness: 73 y/o male with history of CKD now in ESRD.  We have been consulted for HD access and TDC.  He was seen by Dr. Scot Bradley on 04/04/19 and based on  his vein map looks like he might be Bradley candidate for Bradley left upper arm brachiocephalic fistula or potentially Bradley basilic vein transposition.    Past medical history includes: HTN and DM.  Recently hospitalized for GI bleed.  He is right hand dominant and has no history of pacemaker or AICD.     Current Facility-Administered Medications  Medication Dose Route Frequency Provider Last Rate Last Dose  . acetaminophen (TYLENOL) tablet 650 mg  650 mg Oral Q6H PRN Louis Spates, Bradley       Or  . acetaminophen (TYLENOL) suppository 650 mg  650 mg Rectal Q6H PRN Louis Spates, Bradley      . albuterol (PROVENTIL) (2.5 MG/3ML) 0.083% nebulizer solution 2.5 mg  2.5 mg Nebulization Q4H PRN Louis Spates, Bradley      . atorvastatin (LIPITOR) tablet 20 mg  20 mg Oral q1800 Louis Beavers Bradley, Bradley   20 mg at 05/15/19 1748  . calcitRIOL (ROCALTROL) capsule 0.25 mcg  0.25 mcg Oral Q M,W,F Louis Spates, Bradley   0.25 mcg at 05/16/19 0847  . calcium acetate (PHOSLO) capsule 667 mg  667 mg Oral TID WC Louis Bradley   667 mg at 05/15/19 1748  . carvedilol (COREG) tablet 6.25 mg  6.25 mg Oral BID WC Louis Beavers Bradley, Bradley   6.25 mg at 05/16/19 0847  . ceFEPIme (MAXIPIME) 2 g in sodium chloride 0.9 % 100 mL IVPB  2 g Intravenous Q24H Louis Spates, Bradley 200 mL/hr at 05/15/19 1748 2 g at 05/15/19 1748  . doxycycline (VIBRAMYCIN) 100 mg in sodium chloride 0.9 % 250 mL IVPB  100 mg Intravenous Q12H Louis Beavers Bradley, Bradley 125 mL/hr at 05/16/19 0522 100 mg at 05/16/19 0522  . furosemide (LASIX) tablet 80 mg  80 mg Oral BID Louis Bradley   80 mg at 05/16/19 0847  . guaiFENesin (MUCINEX) 12 hr tablet 600 mg  600 mg Oral BID  Louis Spates, Bradley   600 mg at 05/14/19 2140  . insulin aspart (novoLOG) injection 0-9 Units  0-9 Units Subcutaneous TID WC Louis Spates, Bradley   2 Units at 05/15/19 1750  . lidocaine (PF) (XYLOCAINE) 1 % injection    PRN Louis Norse A, PA-C   10 mL at 05/15/19 1241  . nicotine polacrilex (COMMIT) lozenge 4 mg  4 mg Oral PRN Louis Spates, Bradley      . pantoprazole (PROTONIX) injection 40 mg  40 mg Intravenous Louis Coon, Bradley   40 mg at 05/16/19 0847  . sodium bicarbonate tablet 650 mg  650 mg Oral BID Louis Bradley   650 mg at 05/16/19 0846  . sodium chloride flush (NS) 0.9 % injection 3 mL  3 mL Intravenous Louis Coon, Bradley   3 mL at 05/15/19 2158  . sodium zirconium cyclosilicate (LOKELMA) packet 10 g  10 g Oral Once per day on Mon Wed Fri Bradley, James, Bradley   10 g at 05/14/19 2100    Pt meds include: Statin :Yes Betablocker: Yes ASA: No Other anticoagulants/antiplatelets: None  Past Medical History:  Diagnosis Date  . Anemia   .  Anesthesia complication    General anesthesia makes me feel foggy for long periods of time  . Arthritis   . CKD (chronic kidney disease) stage V requiring chronic dialysis ([place])    Stage 4-5  . Complication of anesthesia    slow to come out of anesthesia - Bradley lot of confusion   . Diabetes ([place])   . Fournier gangrene   . GI bleed   . Heart murmur    Born with, no problems  . History of blood transfusion    x2  . Hyperlipidemia   . Hypertension   . Moderate aortic stenosis   . NSTEMI (non-ST elevated myocardial infarction) ([place])   . Obesity   . OSA on CPAP   . Osteomyelitis ([place]) 2016   L toe  . Pulmonary hypertension ([place])   . Renal atrophy, left   . Vitamin D deficiency     Past Surgical History:  Procedure Laterality Date  . AMPUTATION TOE Left 04/20/2019   Procedure: AMPUTATION LEFT SECOND AND THIRD TOES;  Surgeon: Louis Minion, Bradley;  Location: [facility];  Service: Orthopedics;  Laterality: Left;  . BIOPSY   05/13/2019   Procedure: BIOPSY;  Surgeon: Louis Spates, Bradley;  Location: [facility];  Service: Endoscopy;;  . COLONOSCOPY    . COLOSTOMY  03/2015  . COLOSTOMY REVERSAL    . ESOPHAGOGASTRODUODENOSCOPY N/Bradley 05/13/2019   Procedure: ESOPHAGOGASTRODUODENOSCOPY (EGD);  Surgeon: Louis Spates, Bradley;  Location: [facility];  Service: Endoscopy;  Laterality: N/Bradley;  . IR THORACENTESIS ASP PLEURAL SPACE W/IMG GUIDE  05/15/2019  . TOE AMPUTATION Left 2016   Left big toe amputated due to MRSA infection    Social History Social History   Tobacco Use  . Smoking status: Former Research scientist (life sciences)  . Smokeless tobacco: Never Used  . Tobacco comment: quit 12/2018 (1 ppd)  Substance Use Topics  . Alcohol use: Yes    Comment: social  . Drug use: Never    Family History Family History  Problem Relation Age of Onset  . CAD Father 63  . CAD Other        2 uncles had heart attacks  . Colon cancer Neg Hx   . Prostate cancer Neg Hx     No Known Allergies   REVIEW OF SYSTEMS  General: [ ]  Weight loss, [ ]  Fever, [ ]  chills Neurologic: [ ]  Dizziness, [ ]  Blackouts, [ ]  Seizure [ ]  Stroke, [ ]  "Mini stroke", [ ]  Slurred speech, [ ]  Temporary blindness; [ ]  weakness in arms or legs, [ ]  Hoarseness [ ]  Dysphagia Cardiac: [ ]  Chest pain/pressure, [ ]  Shortness of breath at rest [ ]  Shortness of breath with exertion, [ ]  Atrial fibrillation or irregular heartbeat  Vascular: [ ]  Pain in legs with walking, [ ]  Pain in legs at rest, [ ]  Pain in legs at night,  [ ]  Non-healing ulcer, [ ]  Blood clot in vein/DVT,   Pulmonary: [ ]  Home oxygen, [ ]  Productive cough, [ ]  Coughing up blood, [ ]  Asthma,  [ ]  Wheezing [ ]  COPD Musculoskeletal:  [ ]  Arthritis, [ ]  Low back pain, [ ]  Joint pain Hematologic: [ ]  Easy Bruising, [x ] Anemia; [ ]  Hepatitis Gastrointestinal: [ ]  Blood in stool, [ ]  Gastroesophageal Reflux/heartburn, Urinary: [x ] chronic Kidney disease, [ ]  on HD - [ ]  MWF or [ ]  TTHS, [ ]  Burning with urination, [ ]   Difficulty urinating Skin: [ ]  Rashes, [ ]  Wounds Psychological: [ ]   Anxiety, [ ]  Depression  Physical Examination Vitals:   05/15/19 1748 05/15/19 2000 05/15/19 2057 05/16/19 0500  BP: 126/82  135/62 128/63  Pulse:  (!) 56 (!) 54 (!) 55  Resp:  15 18 17   Temp:   97.6 F (36.4 C) 97.9 F (36.6 C)  TempSrc:   Oral Axillary  SpO2:  96% 95% 96%  Weight:    130.9 kg  Height:       Body mass index is 36.07 kg/m.  General:  WDWN in NAD HENT: WNL Eyes: Pupils equal Pulmonary: normal non-labored breathing , without Rales, rhonchi,  wheezing Cardiac: RRR, without  Murmurs, rubs or gallops; No carotid bruits Abdomen: soft, NT, no masses Skin: no rashes, ulcers noted;  no Gangrene , no cellulitis; no open wounds;   Vascular Exam/Pulses:Radial and brachial pulses palpable   Musculoskeletal: no muscle wasting or atrophy; no edema in B UE Neurologic: Bradley&O X 3; Appropriate Affect ;  SENSATION: normal; MOTOR FUNCTION: 5/5 Symmetric Speech is fluent/normal   Significant Diagnostic Studies: CBC Lab Results  Component Value Date   WBC 8.6 05/16/2019   HGB 8.2 (L) 05/16/2019   HCT 24.9 (L) 05/16/2019   MCV 88.0 05/16/2019   PLT 195 05/16/2019    BMET    Component Value Date/Time   NA 140 05/16/2019 0500   NA 136 (Bradley) 03/19/2019   K 4.3 05/16/2019 0500   CL 104 05/16/2019 0500   CO2 21 (L) 05/16/2019 0500   GLUCOSE 118 (H) 05/16/2019 0500   BUN 138 (H) 05/16/2019 0500   BUN 59 (Bradley) 03/19/2019   CREATININE 5.03 (H) 05/16/2019 0500   CALCIUM 8.6 (L) 05/16/2019 0500   CALCIUM 7.9 (L) 05/15/2019 0513   GFRNONAA 11 (L) 05/16/2019 0500   GFRAA 12 (L) 05/16/2019 0500   Estimated Creatinine Clearance: 19.1 mL/min (Bradley) (by C-G formula based on SCr of 5.03 mg/dL (H)).  COAG Lab Results  Component Value Date   INR 1.1 05/10/2019     Non-Invasive Vascular Imaging:  Acceptable left basilic and right cephalic  ASSESSMENT/PLAN:  NPO now with Bradley plan to perform Left basilic AV  fistula creation verses  av graft and TDC placement.   Roxy Horseman 05/16/2019 9:02 AM   I agree with the above.  I discussed with the patient and his wife that we will plan on placing Bradley TDC and left arm fistula.  We discussed the possibility of additional surgery to get his fistula to mature.  All  Questions answered.  Annamarie Major

## 2019-05-16 NOTE — Transfer of Care (Signed)
Immediate Anesthesia Transfer of Care Note  Patient: Louis Bradley  Procedure(s) Performed: INSERTION OF DIALYSIS CATHETER (Right Chest) ARTERIOVENOUS (AV) FISTULA CREATION (Left Arm Upper)  Patient Location: PACU  Anesthesia Type:MAC  Level of Consciousness: awake, alert  and oriented  Airway & Oxygen Therapy: Patient Spontanous Breathing and Patient connected to nasal cannula oxygen  Post-op Assessment: Report given to RN, Post -op Vital signs reviewed and stable and Patient moving all extremities X 4  Post vital signs: Reviewed and stable  Last Vitals:  Vitals Value Taken Time  BP 127/58 05/16/19 1511  Temp    Pulse 57 05/16/19 1512  Resp 12 05/16/19 1512  SpO2 93 % 05/16/19 1512  Vitals shown include unvalidated device data.  Last Pain:  Vitals:   05/16/19 0830  TempSrc:   PainSc: 0-No pain      Patients Stated Pain Goal: 0 (62/95/28 4132)  Complications: No apparent anesthesia complications

## 2019-05-16 NOTE — Op Note (Signed)
Patient name: Louis Bradley MRN: [id number] DOB: October 07, 1946 Sex: male  05/12/2019 - 05/16/2019 Pre-operative Diagnosis: End-stage renal disease Post-operative diagnosis:  Same Surgeon:  Annamarie Major Assistants:  Ellsworth Lennox Procedure:   #1: Ultrasound-guided placement of a right internal jugular vein 23 cm palindrome tunneled dialysis catheter   #2: Left brachiocephalic fistula Anesthesia: MAC Blood Loss: Minimal Specimens: None  Findings: Excellent cephalic vein measuring 4-5 mm.  Healthy 4 mm brachial artery  Indications: The patient is in need of urgent dialysis access.  He is here today for a fistula and catheter.  I discussed the details of the procedure with the patient and his wife who I spoke to via telephone.  All questions were answered.  Procedure:  The patient was identified in the holding area and taken to [place] 10  The patient was then placed supine on the table. MAC anesthesia was administered.  The patient was prepped and draped in the usual sterile fashion.  A time out was called and antibiotics were administered.  Ultrasound was used to evaluate the right internal jugular vein which was widely patent and easily compressible.  1% lidocaine was used for local anesthesia.  A skin nick was made with a #11 blade.  The right internal jugular vein was cannulated under ultrasound guidance with an 18-gauge needle.  A 035 wire was then advanced into the inferior vena cava under fluoroscopic visualization.  Sequential dilators were used to dilate the subcutaneous tract and a peel-away sheath was placed.  A 23 cm palindrome catheter was then inserted over the wire.  The wire was removed as well as the peel-away sheath.  A skin exit site was selected and anesthetized with 1% lidocaine.  A skin nick was made with an 11 blade.  A subcutaneous tunnel was created and the catheter was then brought through the previously created tunnel.  The catheter was connected to the 2 ports.  Both ports  flushed and aspirated without difficulty.  Fluoroscopy was used to confirm that the catheter tip was at the cavoatrial junction and that there were no kinks within the catheter.  The catheter was then filled with the appropriate volumes of heparin.  It was sutured in place with 3-0 nylon.  The skin incision the neck was closed with 4-0 Vicryl.  Next, attention was turned towards the left arm.  Ultrasound was used to evaluate the surface veins.  He had an excellent appearing cep acral followed by Dermabond.  There were no immediate complications.  Halic vein measuring 4-5 mm of the antecubital crease and staying at least 3 mm throughout the upper arm.  I therefore elected to proceed with a brachiocephalic fistula.  Again 1% lidocaine was used for local anesthesia.  A transverse incision was made just proximal to the antecubital crease.  I first dissected out the brachial artery which is a 4 mm disease-free artery.  Next I exposed the cephalic vein.  There was mild scar tissue around the vein it was dilated nicely at about 4-5 mm.  It was fully mobilized.  I marked it for orientation and then ligated it distally.  It flushed easily with heparin saline.  Next, the brachial artery was occluded with vascular clamps.  A #11 blade was used to make an arteriotomy which was extended longitudinally with Potts scissors.  The vein was cut the appropriate length and then spatulated to fit the size of the arteriotomy.  A running anastomosis was created with 6-0 Prolene.  Just prior to completion, the appropriate flushing maneuvers were performed and the anastomosis was completed.  I inspected the course of the vein to make sure there were no kinks.  I ligated all visible branches.  There was an excellent thrill within the fistula and a palpable radial pulse.  Hemostasis was then achieved with cautery.  The wound was irrigated.  The incision was closed with 2 layers of   Disposition: To PACU stable   V. Annamarie Major,  M.D., [facility] [place] Office: [phone] Pager:  [phone]

## 2019-05-16 NOTE — Progress Notes (Signed)
PROGRESS NOTE    Louis Bradley  TMY:[id number] DOB: 1946-04-27 DOA: 05/12/2019 PCP: Colon Branch, MD   Brief Narrative:  Patient is a 73 year old male with history of HTN, CKD-4, OSA on CPAP, recent hospitalization from 6/25-6/26 for GIB/NSTEMI presenting with generalized weakness, shortness of breath, weight cough and melena. Reportedly declined EGD and LHC at his recent hospitalization for GIB and NSTEMI and went home. In ED, low BP to 88/49 that is improved with IV fluids.  Mild low temperature to 96.  WBC 12.5.  Hgb 6.9 (6.8 on 6/26 prior to discharge although received 1 unit after that).  Sodium 132.  BUN 134.  Creatinine 5.6.  Troponin 2283.  BNP 1225 (about the same on 6/25).  Lactic acid 0.8.  Urinalysis suggestive for UTI.  CXR concerning for multifocal airspace opacities concerning for pneumonia.  FOBT positive. Patient was started on vancomycin and cefepime empirically for pneumonia and UTI.  One unit blood ordered and admitted for further care  7/1: Plan is to start the dialysis. Undergoing the temporary dialysis catheter ,AV graft today  Assessment & Plan:   Principal Problem:   Sepsis ([place]) Active Problems:   Diabetes mellitus type 2 with complications ([place])   GI bleed   Acute blood loss anemia   Hypotension   Pneumonia   Acute respiratory failure (HCC)   Acute lower UTI   Atrial fibrillation (HCC)   Volume overload   Pulmonary hypertension (HCC)   Acute respiratory failure: Multifactorial including acute on chronic CHF, non-STEMI, pleural effusion, anemia and possible pneumonia.  Chest x-ray showed possibe CHF versus pneumonia.  -Respiratory status stable currently. Saturating fine on  room air today.  Acute on chronic combined CHF/moderate pulmonary hypertension: Presented with dspnea.  CXR concerning for CHF.  CT abdomen reveals moderate to large pleural effusion, right greater than left and other findings suggestive for CHF. -Daily weight, intake output and  renal functions -Salt and fluid restrictions -lasix as per nephrology  Moderate to large bilateral pleural effusion, right greater than left: Likely due to fluid overload from CHF and renal failure versus infectious process. -Ultrasound-guided right sided thoracocentesis done and mL of fluids were removed . -Fluid management per cardiology and nephrology.  Symptomatic anemia due to GI bleed/iron deficiency anemia: had melanotic stool on admission.  Iron saturation 9% -Had EGD on 6/28 that revealed gastritis and duodenitis but no active bleed. -Hemoglobin stable -Monitor H&H -Continue PPI -Iron infusion and Epo -GI rec-signed off  Suspected Pneumonia:  CXR favors fluid versus infectious process. CXR on 05/12/19 showed bibasilar airspace consolidation, most likely representing multifocal pneumonia   Cultures negative so far.  COVID-19 screen negative.  Procalcitonin marginal.  CT abdomen reveals RML opacity which could also be due to fluid overload. -Discontinue vancomycin 6/28 -Continue cefepime and doxycycline for now.We will de-escalate antibiotics soon. -Follow sputum culture  Pyuria versus UTI: Patient without UTI symptoms.  Urinalysis with large LE  Non-STEMI: Troponin elevated.  EKG with some T wave inversions which seems to be old.  -No anticoagulation due to GI bleed -Carvedilol at a lower dose. -Atorvastatin. -Cardiology consulted.  Invasive procedure held for now.  CKD-5/azotemia: creatinine 5.03 (5.29 when discharged on 6/26).  Recent values range from 4.5-5.5. Azotemia could be due to GI bleed and renal failure.  Continues to have fair urine output. -Nephrology following, planning for temporary dialysis catheter placement and AV graft placement today  Severe chronic left hydronephrosis with cortical thinning of left kidney:  -Urology, Dr. Rush Landmark  consulted on 6/30.No plan for any intervention  Mechanical fall at home Right shoulder pain -Patient has some pain in  right shoulder with limited range of motion -Shoulder x-ray without fracture dislocation. -Orthopedics consulted on 6/30 and recommended NSAIDs, gentle motion exercises and follow-up with Dr. Sharol Given in 2 weeks.  Well-controlled NIDDM-2 with renal complication.   On glipizide at home. -SSI-renal   OSA on CPAP:  -nightly CPAP  Recent right toes amputation: Stable -Stitch removed on 6/29 here  Normocytic anemia: Hemoglobin of 8.2.  Most likely associated with CKD.  Continue to monitor         DVT prophylaxis: SCD Code Status: Full Family Communication: Called family for update Disposition Plan:    Consultants: Nephrology, cardiology  Procedures: Echo  Antimicrobials:  Anti-infectives (From admission, onward)   Start     Dose/Rate Route Frequency Ordered Stop   05/17/19 0600  cefUROXime (ZINACEF) 1.5 g in sodium chloride 0.9 % 100 mL IVPB     1.5 g 200 mL/hr over 30 Minutes Intravenous To Short Stay 05/16/19 0926 05/18/19 0600   05/14/19 1800  vancomycin (VANCOCIN) IVPB 1000 mg/200 mL premix  Status:  Discontinued     1,000 mg 200 mL/hr over 60 Minutes Intravenous Every 48 hours 05/12/19 1633 05/13/19 1119   05/13/19 1800  vancomycin (VANCOCIN) IVPB 1000 mg/200 mL premix  Status:  Discontinued     1,000 mg 200 mL/hr over 60 Minutes Intravenous Every 48 hours 05/12/19 1632 05/12/19 1633   05/13/19 1700  ceFEPIme (MAXIPIME) 2 g in sodium chloride 0.9 % 100 mL IVPB     2 g 200 mL/hr over 30 Minutes Intravenous Every 24 hours 05/12/19 1632     05/13/19 1700  doxycycline (VIBRAMYCIN) 100 mg in sodium chloride 0.9 % 250 mL IVPB     100 mg 125 mL/hr over 120 Minutes Intravenous Every 12 hours 05/13/19 1654     05/12/19 1630  vancomycin (VANCOCIN) 2,500 mg in sodium chloride 0.9 % 500 mL IVPB     2,500 mg 250 mL/hr over 120 Minutes Intravenous  Once 05/12/19 1626 05/12/19 2030   05/12/19 1615  ceFEPIme (MAXIPIME) 2 g in sodium chloride 0.9 % 100 mL IVPB     2 g 200 mL/hr  over 30 Minutes Intravenous  Once 05/12/19 1604 05/12/19 1732      Subjective:  Patient seen and examined at the bedside this morning.  Hemodynamically stable.  Looked comfortable.  Denies any complaints.  No chest pain or shortness of breath.  He says he is making good amount of urine.  He was curious to know about his EGD report.  Objective: Vitals:   05/15/19 1748 05/15/19 2000 05/15/19 2057 05/16/19 0500  BP: 126/82  135/62 128/63  Pulse:  (!) 56 (!) 54 (!) 55  Resp:  15 18 17   Temp:   97.6 F (36.4 C) 97.9 F (36.6 C)  TempSrc:   Oral Axillary  SpO2:  96% 95% 96%  Weight:    130.9 kg  Height:        Intake/Output Summary (Last 24 hours) at 05/16/2019 1107 Last data filed at 05/16/2019 1610 Gross per 24 hour  Intake 418 ml  Output 3425 ml  Net -3007 ml   Filed Weights   05/14/19 0553 05/15/19 0500 05/16/19 0500  Weight: 135.2 kg 134 kg 130.9 kg    Examination:  General exam: Appears calm and comfortable ,Not in distress,average built HEENT:PERRL,Oral mucosa moist, Ear/Nose normal on gross exam Respiratory  system: Bilateral equal air entry, normal vesicular breath sounds, no wheezes or crackles  Cardiovascular system: S1 & S2 heard, RRR. No JVD, murmurs, rubs, gallops or clicks. No pedal edema. Gastrointestinal system: Abdomen is nondistended, soft and nontender. No organomegaly or masses felt. Normal bowel sounds heard. Central nervous system: Alert and oriented. No focal neurological deficits. Extremities: No edema, no clubbing ,no cyanosis, distal peripheral pulses palpable. Skin: No rashes, lesions or ulcers,no icterus ,no pallor     Data Reviewed: I have personally reviewed following labs and imaging studies  CBC: Recent Labs  Lab 05/12/19 1434 05/13/19 1153 05/13/19 1707 05/14/19 0643 05/15/19 0513 05/15/19 1434 05/16/19 0500  WBC 12.5* 10.9*  --  9.6 9.2  --  8.6  NEUTROABS 11.3*  --   --  7.8*  --   --   --   HGB 6.9* 6.8* 7.9* 7.0* 7.7* 8.6* 8.2*   HCT 21.7* 20.9* 23.8* 21.6* 23.1* 25.9* 24.9*  MCV 91.6 90.5  --  91.5 89.2  --  88.0  PLT 181 170  --  177 178  --  462   Basic Metabolic Panel: Recent Labs  Lab 05/11/19 0935 05/12/19 1434 05/13/19 1153 05/14/19 0643 05/15/19 0513 05/16/19 0500  NA 137 132* 136 139 138 140  K 4.6 4.7 4.7 4.4 4.8 4.3  CL 106 101 106 107 106 104  CO2 20* 18* 19* 19* 21* 21*  GLUCOSE 139* 182* 140* 89 117* 118*  BUN 119* 134* 139* 146* 146* 138*  CREATININE 5.29* 5.60* 5.64* 5.54* 5.38* 5.03*  CALCIUM 8.1* 7.8* 7.7* 7.6* 8.1*   7.9* 8.6*  MG 2.9*  --  3.2* 3.0* 2.9* 3.3*  PHOS  --   --  6.0* 6.9* 6.4* 5.5*   GFR: Estimated Creatinine Clearance: 19.1 mL/min (A) (by C-G formula based on SCr of 5.03 mg/dL (H)). Liver Function Tests: Recent Labs  Lab 05/10/19 1924 05/12/19 1434 05/13/19 1153 05/14/19 0643 05/15/19 0513 05/16/19 0500  AST 18 13*  --   --   --   --   ALT 19 15  --   --   --   --   ALKPHOS 52 50  --   --   --   --   BILITOT 0.5 0.4  --   --   --   --   PROT 6.1* 5.8*  --   --   --   --   ALBUMIN 3.2* 3.0* 2.8* 2.6* 2.7* 2.8*   No results for input(s): LIPASE, AMYLASE in the last 168 hours. No results for input(s): AMMONIA in the last 168 hours. Coagulation Profile: Recent Labs  Lab 05/10/19 1924  INR 1.1   Cardiac Enzymes: No results for input(s): CKTOTAL, CKMB, CKMBINDEX, TROPONINI in the last 168 hours. BNP (last 3 results) No results for input(s): PROBNP in the last 8760 hours. HbA1C: No results for input(s): HGBA1C in the last 72 hours. CBG: Recent Labs  Lab 05/15/19 0805 05/15/19 1326 05/15/19 1642 05/15/19 2230 05/16/19 0744  GLUCAP 112* 132* 157* 104* 108*   Lipid Profile: No results for input(s): CHOL, HDL, LDLCALC, TRIG, CHOLHDL, LDLDIRECT in the last 72 hours. Thyroid Function Tests: Recent Labs    05/15/19 0513  TSH 0.965   Anemia Panel: Recent Labs    05/13/19 1704 05/13/19 1707  VITAMINB12  --  442  FOLATE 29.7  --   FERRITIN  --   205  TIBC  --  318  IRON  --  30*  RETICCTPCT  --  4.8*   Sepsis Labs: Recent Labs  Lab 05/12/19 1438 05/12/19 1832 05/13/19 1153  PROCALCITON  --   --  0.20  LATICACIDVEN 0.8 0.8  --     Recent Results (from the past 240 hour(s))  Novel Coronavirus,NAA,(SEND-OUT TO REF LAB - TAT 24-48 hrs); Hosp Order     Status: None   Collection Time: 05/10/19  7:39 PM   Specimen: Nasopharyngeal Swab; Respiratory  Result Value Ref Range Status   SARS-CoV-2, NAA NOT DETECTED NOT DETECTED Final    Comment: (NOTE) This test was developed and its performance characteristics determined by [employer]. This test has not been FDA cleared or approved. This test has been authorized by FDA under an Emergency Use Authorization (EUA). This test is only authorized for the duration of time the declaration that circumstances exist justifying the authorization of the emergency use of in vitro diagnostic tests for detection of SARS-CoV-2 virus and/or diagnosis of COVID-19 infection under section 564(b)(1) of the Act, 21 U.S.C. 185UDJ-4(H)(7), unless the authorization is terminated or revoked sooner. When diagnostic testing is negative, the possibility of a false negative result should be considered in the context of a patient's recent exposures and the presence of clinical signs and symptoms consistent with COVID-19. An individual without symptoms of COVID-19 and who is not shedding SARS-CoV-2 virus would expect to have a negative (not detected) result in this assay. Performed  At: [facility] [address] [place], [place] [id number] Rush Farmer MD FO:[id number]    [place]  Final    Comment: Performed at [facility], [place] [address], [place], [place] [address]  SARS Coronavirus 2 (CEPHEID- Performed in [place] hospital lab), Hosp Order     Status: None   Collection Time: 05/12/19  2:34 PM   Specimen: Nasopharyngeal Swab  Result Value Ref  Range Status   SARS Coronavirus 2 NEGATIVE NEGATIVE Final    Comment: (NOTE) If result is NEGATIVE SARS-CoV-2 target nucleic acids are NOT DETECTED. The SARS-CoV-2 RNA is generally detectable in upper and lower  respiratory specimens during the acute phase of infection. The lowest  concentration of SARS-CoV-2 viral copies this assay can detect is 250  copies / mL. A negative result does not preclude SARS-CoV-2 infection  and should not be used as the sole basis for treatment or other  patient management decisions.  A negative result may occur with  improper specimen collection / handling, submission of specimen other  than nasopharyngeal swab, presence of viral mutation(s) within the  areas targeted by this assay, and inadequate number of viral copies  (<250 copies / mL). A negative result must be combined with clinical  observations, patient history, and epidemiological information. If result is POSITIVE SARS-CoV-2 target nucleic acids are DETECTED. The SARS-CoV-2 RNA is generally detectable in upper and lower  respiratory specimens dur ing the acute phase of infection.  Positive  results are indicative of active infection with SARS-CoV-2.  Clinical  correlation with patient history and other diagnostic information is  necessary to determine patient infection status.  Positive results do  not rule out bacterial infection or co-infection with other viruses. If result is PRESUMPTIVE POSTIVE SARS-CoV-2 nucleic acids MAY BE PRESENT.   A presumptive positive result was obtained on the submitted specimen  and confirmed on repeat testing.  While 2019 novel coronavirus  (SARS-CoV-2) nucleic acids may be present in the submitted sample  additional confirmatory testing may be necessary for epidemiological  and / or clinical management purposes  to differentiate between  SARS-CoV-2 and other Sarbecovirus currently known to infect humans.  If clinically indicated additional testing with an  alternate test  methodology [phone]) is advised. The SARS-CoV-2 RNA is generally  detectable in upper and lower respiratory sp ecimens during the acute  phase of infection. The expected result is Negative. Fact Sheet for Patients:  StrictlyIdeas.no Fact Sheet for Healthcare Providers: BankingDealers.co.za This test is not yet approved or cleared by the [place] FDA and has been authorized for detection and/or diagnosis of SARS-CoV-2 by FDA under an Emergency Use Authorization (EUA).  This EUA will remain in effect (meaning this test can be used) for the duration of the COVID-19 declaration under Section 564(b)(1) of the Act, 21 U.S.C. section 360bbb-3(b)(1), unless the authorization is terminated or revoked sooner. Performed at [facility], [place] [address] [place], [place] [address]   Blood Culture (routine x 2)     Status: None (Preliminary result)   Collection Time: 05/12/19  2:45 PM   Specimen: BLOOD  Result Value Ref Range Status   Specimen Description BLOOD RIGHT ANTECUBITAL  Final   Special Requests   Final    BOTTLES DRAWN AEROBIC AND ANAEROBIC Blood Culture results may not be optimal due to an excessive volume of blood received in culture bottles   Culture   Final    NO GROWTH 4 DAYS Performed at [facility], [place] [address] [place], [place] [address]    Report Status PENDING  Incomplete  Blood Culture (routine x 2)     Status: None (Preliminary result)   Collection Time: 05/12/19  3:09 PM   Specimen: BLOOD  Result Value Ref Range Status   Specimen Description BLOOD LEFT ANTECUBITAL  Final   Special Requests   Final    BOTTLES DRAWN AEROBIC AND ANAEROBIC Blood Culture adequate volume   Culture   Final    NO GROWTH 4 DAYS Performed at [facility], [place] [address], [place], [place] [address]    Report Status PENDING  Incomplete  Urine Culture     Status: Abnormal   Collection Time: 05/12/19  4:00  PM   Specimen: Urine, Random  Result Value Ref Range Status   Specimen Description URINE, RANDOM  Final   Special Requests   Final    NONE Performed at [facility], [place] [address] [place], [place] [address]    Culture MULTIPLE SPECIES PRESENT, SUGGEST RECOLLECTION (A)  Final   Report Status 05/14/2019 FINAL  Final  Gram stain     Status: None   Collection Time: 05/15/19  1:00 PM   Specimen: Pleural, Right; Pleural Fluid  Result Value Ref Range Status   Specimen Description PLEURAL  Final   Special Requests FLUID  Final   Gram Stain   Final    WBC PRESENT,BOTH PMN AND MONONUCLEAR NO ORGANISMS SEEN CYTOSPIN SMEAR Performed at [facility], [place] [address], [place], [place] [address]    Report Status 05/15/2019 FINAL  Final  Culture, body fluid-bottle     Status: None (Preliminary result)   Collection Time: 05/15/19  1:02 PM   Specimen: Pleura  Result Value Ref Range Status   Specimen Description PLEURAL  Final   Special Requests FLUID  Final   Culture   Final    NO GROWTH < 24 HOURS Performed at [facility], [place] [address] [place],  [address]  Report Status PENDING  Incomplete         Radiology Studies: Ct Abdomen Pelvis Wo Contrast  Result Date: 05/14/2019 CLINICAL DATA:  Hydronephrosis EXAM: CT ABDOMEN AND PELVIS WITHOUT CONTRAST TECHNIQUE: Multidetector CT imaging of the abdomen and pelvis was performed following the standard protocol without IV contrast. COMPARISON:  Renal ultrasound from the same day FINDINGS: Lower chest: There are moderate to large bilateral pleural effusions, right greater than left. There is interlobular septal thickening bilaterally. There is a focal airspace opacity within the right middle lobe. There are distal scattered airspace opacities throughout the remaining lower lung fields.The heart size is normal. Mitral valve calcifications are noted. Coronary artery calcifications are noted. The intracardiac blood pool is  hypodense relative to the adjacent myocardium consistent with anemia. Hepatobiliary: The liver is normal. Normal gallbladder.There is no biliary ductal dilation. Pancreas: Normal contours without ductal dilatation. No peripancreatic fluid collection. Spleen: The spleen is borderline enlarged. Adrenals/Urinary Tract: --Adrenal glands: No adrenal hemorrhage. --Right kidney/ureter: There is no right-sided hydronephrosis. There are multiple right-sided cysts, some which are hyperdense. --Left kidney/ureter: There is severe left-sided hydronephrosis that appears longstanding. There is extensive cortical thinning. This is likely secondary to a chronic left UPJ obstruction. --Urinary bladder: Unremarkable. Stomach/Bowel: --Stomach/Duodenum: No hiatal hernia or other gastric abnormality. Normal duodenal course and caliber. --Small bowel: No dilatation or inflammation. --Colon: No focal abnormality. --Appendix: Normal. Vascular/Lymphatic: Atherosclerotic calcification is present within the non-aneurysmal abdominal aorta, without hemodynamically significant stenosis. --No retroperitoneal lymphadenopathy. --No mesenteric lymphadenopathy. --No pelvic or inguinal lymphadenopathy. Reproductive: Unremarkable Other: No ascites or free air. There is a left lower quadrant abdominal wall hernia containing multiple loops of small bowel without evidence of an obstruction. Musculoskeletal. No acute displaced fractures. IMPRESSION: 1. Severe chronic left-sided hydronephrosis, likely secondary to a chronic left UPJ obstruction. 2. No right-sided hydronephrosis. 3. Moderate to large bilateral pleural effusions, right greater than left. 4. Interlobular septal thickening involving the bilateral lower lung fields is consistent with volume overload. There is scattered bilateral airspace opacities which may be secondary to pulmonary edema, however an infectious process is not excluded, especially within the right middle lobe. There is  compressive atelectasis at the lung bases. 5. Left lower quadrant abdominal wall hernia containing loops of small bowel without evidence of an obstruction. Aortic Atherosclerosis (ICD10-I70.0). Electronically Signed   By: Constance Holster M.D.   On: 05/14/2019 21:01   Dg Chest 1 View  Result Date: 05/15/2019 CLINICAL DATA:  S/p right thoracentesis yielding 800 mL clear yellow fluid. EXAM: CHEST  1 VIEW COMPARISON:  05/13/2019 FINDINGS: Status post right thoracentesis. Bilateral mild interstitial thickening. Improved right perihilar airspace disease. No significant pleural effusion. No pneumothorax. Stable cardiomegaly. No acute osseous abnormality. IMPRESSION: 1. No right pneumothorax status post thoracentesis. 2. Cardiomegaly with mild pulmonary vascular congestion. Electronically Signed   By: Kathreen Devoid   On: 05/15/2019 14:23   Dg Shoulder Right  Result Date: 05/14/2019 CLINICAL DATA:  Shoulder pain.  Fall.  Arthritis. EXAM: RIGHT SHOULDER - 2+ VIEW COMPARISON:  None. FINDINGS: There is no acute displaced fracture or dislocation. There is mild-to-moderate osteoarthritis involving the right glenohumeral and right AC joints. IMPRESSION: 1. No acute osseous abnormality. 2. Mild-to-moderate osteoarthritis of the right glenohumeral and right AC joints. Electronically Signed   By: Constance Holster M.D.   On: 05/14/2019 19:29   US Renal  Result Date: 05/14/2019 CLINICAL DATA:  Acute kidney injury, history hypertension, diabetes mellitus, former smoker, stage 5 chronic kidney disease EXAM: RENAL /  URINARY TRACT ULTRASOUND COMPLETE COMPARISON:  None FINDINGS: Right Kidney: Renal measurements: 12.9 x 6.6 x 7.8 cm = volume: 346 mL. Cortical thinning. Increased cortical echogenicity. No hydronephrosis or shadowing calcification. Three cysts present. Small cyst at mid kidney 2.5 x 2.0 x 2.3 cm. Cyst at inferior pole 4.6 x 6.0 x 3.6 cm. Tiny upper pole renal cyst 1.5 x 1.3 x 2.2 cm. Left Kidney: Renal  measurements: 13.9 x 7.06.60 cm = volume: 308 mL. Cortical thinning. Increased cortical echogenicity. Multiple peripelvic cysts versus moderate hydronephrosis. Question separate cyst at upper pole 4.1 x 5.1 x 4.5 cm. Bladder: Decompressed, contains minimal urine Dependent shadowing calculus within gallbladder 11 mm diameter. IMPRESSION: Cortical atrophy and medical renal disease changes of both kidneys. Three RIGHT renal cysts as above. Multiple peripelvic cysts versus moderate hydronephrosis; noncontrast CT abdomen recommended to differentiate between cystic disease and hydronephrosis. Electronically Signed   By: Lavonia Dana M.D.   On: 05/14/2019 14:40   Ir Thoracentesis Asp Pleural Space W/img Guide  Result Date: 05/15/2019 INDICATION: Patient with history of CKD, pulmonary HTN, CHF with complaints of dyspnea requiring supplemental oxygen. Imaging shows bilateral pleural effusions - request to IR for diagnostic and therapeutic thoracentesis. EXAM: ULTRASOUND GUIDED RIGHT THORACENTESIS MEDICATIONS: 7 mL 1% lidocaine COMPLICATIONS: None immediate. PROCEDURE: An ultrasound guided thoracentesis was thoroughly discussed with the patient and questions answered. The benefits, risks, alternatives and complications were also discussed. The patient understands and wishes to proceed with the procedure. Written consent was obtained. Ultrasound was performed to localize and mark an adequate pocket of fluid in the right chest. The area was then prepped and draped in the normal sterile fashion. 1% Lidocaine was used for local anesthesia. Under ultrasound guidance a 6 Fr Safe-T-Centesis catheter was introduced. Thoracentesis was performed. The catheter was removed and a dressing applied. FINDINGS: A total of approximately 800 mL of clear yellow fluid was removed. Samples were sent to the laboratory as requested by the clinical team. IMPRESSION: Successful ultrasound guided right thoracentesis yielding 800 mL of pleural fluid.  Read by Candiss Norse, PA-C Electronically Signed   By: Corrie Mckusick D.O.   On: 05/15/2019 13:10        Scheduled Meds:  atorvastatin  20 mg Oral q1800   calcitRIOL  0.25 mcg Oral Q M,W,F   calcium acetate  667 mg Oral TID WC   carvedilol  6.25 mg Oral BID WC   furosemide  80 mg Oral BID   guaiFENesin  600 mg Oral BID   insulin aspart  0-9 Units Subcutaneous TID WC   pantoprazole (PROTONIX) IV  40 mg Intravenous Q12H   sodium bicarbonate  650 mg Oral BID   sodium chloride flush  3 mL Intravenous Q12H   sodium zirconium cyclosilicate  10 g Oral Once per day on Mon Wed Fri   Continuous Infusions:  ceFEPime (MAXIPIME) IV 2 g (05/15/19 1748)   [START ON 05/17/2019] cefUROXime (ZINACEF)  IV     doxycycline (VIBRAMYCIN) IV 100 mg (05/16/19 0522)     LOS: 4 days    Time spent:35 mins. More than 50% of that time was spent in counseling and/or coordination of care.      Shelly Coss, MD [facility] Pager [phone]  If 7PM-7AM, please contact night-coverage [email or web address] Password [email or web address] 05/16/2019, 11:07 AM

## 2019-05-17 ENCOUNTER — Encounter (HOSPITAL_COMMUNITY): Payer: Self-pay | Admitting: Surgery

## 2019-05-17 DIAGNOSIS — I5033 Acute on chronic diastolic (congestive) heart failure: Secondary | ICD-10-CM

## 2019-05-17 LAB — CULTURE, BLOOD (ROUTINE X 2)
Culture: NO GROWTH
Culture: NO GROWTH
Special Requests: ADEQUATE

## 2019-05-17 LAB — PH, BODY FLUID: pH, Body Fluid: 7.5

## 2019-05-17 LAB — MAGNESIUM: Magnesium: 2.2 mg/dL (ref 1.7–2.4)

## 2019-05-17 LAB — RENAL FUNCTION PANEL
Albumin: 2.7 g/dL — ABNORMAL LOW (ref 3.5–5.0)
Anion gap: 10 (ref 5–15)
BUN: 104 mg/dL — ABNORMAL HIGH (ref 8–23)
CO2: 24 mmol/L (ref 22–32)
Calcium: 8.1 mg/dL — ABNORMAL LOW (ref 8.9–10.3)
Chloride: 106 mmol/L (ref 98–111)
Creatinine, Ser: 3.8 mg/dL — ABNORMAL HIGH (ref 0.61–1.24)
GFR calc Af Amer: 17 mL/min — ABNORMAL LOW (ref >60)
GFR calc non Af Amer: 15 mL/min — ABNORMAL LOW (ref >60)
Glucose, Bld: 115 mg/dL — ABNORMAL HIGH (ref 70–99)
Phosphorus: 4.3 mg/dL (ref 2.5–4.6)
Potassium: 3.7 mmol/L (ref 3.5–5.1)
Sodium: 140 mmol/L (ref 135–145)

## 2019-05-17 LAB — CBC
HCT: 24.8 % — ABNORMAL LOW (ref 39.0–52.0)
Hemoglobin: 8.2 g/dL — ABNORMAL LOW (ref 13.0–17.0)
MCH: 29.3 pg (ref 26.0–34.0)
MCHC: 33.1 g/dL (ref 30.0–36.0)
MCV: 88.6 fL (ref 80.0–100.0)
Platelets: 165 10*3/uL (ref 150–400)
RBC: 2.8 MIL/uL — ABNORMAL LOW (ref 4.22–5.81)
RDW: 14.8 % (ref 11.5–15.5)
WBC: 7.6 10*3/uL (ref 4.0–10.5)
nRBC: 0 % (ref 0.0–0.2)

## 2019-05-17 LAB — GLUCOSE, CAPILLARY
Glucose-Capillary: 123 mg/dL — ABNORMAL HIGH (ref 70–99)
Glucose-Capillary: 129 mg/dL — ABNORMAL HIGH (ref 70–99)
Glucose-Capillary: 135 mg/dL — ABNORMAL HIGH (ref 70–99)

## 2019-05-17 LAB — HEPATITIS B SURFACE ANTIGEN: Hepatitis B Surface Ag: NEGATIVE

## 2019-05-17 MED ORDER — SODIUM CHLORIDE 0.9 % IV SOLN: 100.0000 mL | INTRAVENOUS | Status: DC | PRN

## 2019-05-17 MED ORDER — PENTAFLUOROPROP-TETRAFLUOROETH EX AERO: 1.0000 "application " | INHALATION_SPRAY | CUTANEOUS | Status: DC | PRN

## 2019-05-17 MED ORDER — HEPARIN SODIUM (PORCINE) 1000 UNIT/ML DIALYSIS
20.0000 [IU]/kg | Freq: Once | INTRAMUSCULAR | Status: DC
  Administered 2019-05-18: 10:00:00 3200 [IU] via INTRAVENOUS_CENTRAL

## 2019-05-17 MED ORDER — ALTEPLASE 2 MG IJ SOLR: 2.0000 mg | Freq: Once | INTRAMUSCULAR | Status: DC | PRN

## 2019-05-17 MED ORDER — PANTOPRAZOLE SODIUM 40 MG PO TBEC
40.0000 mg | DELAYED_RELEASE_TABLET | Freq: Two times a day (BID) | ORAL | Status: DC
  Administered 2019-05-17: 40 mg via ORAL
  Filled 2019-05-17 (×2): qty 1

## 2019-05-17 MED ORDER — HEPARIN SODIUM (PORCINE) 1000 UNIT/ML DIALYSIS
1000.0000 [IU] | INTRAMUSCULAR | Status: DC | PRN
  Administered 2019-05-17: 1000 [IU] via INTRAVENOUS_CENTRAL
  Filled 2019-05-17: qty 1

## 2019-05-17 MED ORDER — LIDOCAINE HCL (PF) 1 % IJ SOLN: 5.0000 mL | INTRAMUSCULAR | Status: DC | PRN

## 2019-05-17 MED ORDER — HEPARIN SODIUM (PORCINE) 1000 UNIT/ML IJ SOLN
INTRAMUSCULAR | Status: AC
  Filled 2019-05-17: qty 4

## 2019-05-17 MED ORDER — CHLORHEXIDINE GLUCONATE CLOTH 2 % EX PADS
6.0000 | MEDICATED_PAD | Freq: Every day | CUTANEOUS | Status: DC
  Administered 2019-05-18: 07:00:00 6 via TOPICAL

## 2019-05-17 MED ORDER — LIDOCAINE-PRILOCAINE 2.5-2.5 % EX CREA: 1.0000 "application " | TOPICAL_CREAM | CUTANEOUS | Status: DC | PRN

## 2019-05-17 MED ORDER — SODIUM CHLORIDE 0.9 % IV SOLN
125.0000 mg | INTRAVENOUS | Status: DC
  Administered 2019-05-18: 125 mg via INTRAVENOUS
  Filled 2019-05-17 (×2): qty 10

## 2019-05-17 MED ORDER — HEPARIN SODIUM (PORCINE) 1000 UNIT/ML DIALYSIS: 1000.0000 [IU] | INTRAMUSCULAR | Status: DC | PRN

## 2019-05-17 MED ORDER — CHLORHEXIDINE GLUCONATE CLOTH 2 % EX PADS
6.0000 | MEDICATED_PAD | Freq: Every day | CUTANEOUS | Status: DC
  Administered 2019-05-17: 6 via TOPICAL

## 2019-05-17 MED ORDER — CARVEDILOL 3.125 MG PO TABS
3.1250 mg | ORAL_TABLET | Freq: Two times a day (BID) | ORAL | Status: DC
  Administered 2019-05-17: 3.125 mg via ORAL
  Filled 2019-05-17: qty 1

## 2019-05-17 MED ORDER — SODIUM CHLORIDE 0.9 % IV SOLN
1.0000 g | INTRAVENOUS | Status: DC
  Filled 2019-05-17: qty 1

## 2019-05-17 NOTE — TOC Initial Note (Signed)
Transition of Care [facility]) - Initial/Assessment Note    Patient Details  Name: Louis Bradley MRN: [id number] Date of Birth: Jun 08, 1946  Transition of Care [facility]) CM/SW Contact:    Bethena Roys, RN Phone Number: 05/17/2019, 12:24 PM  Clinical Narrative:  Pt presented for weakness and shortness of breath. New start to HD-new AVF and temp dialysis cath. First treatment 05-16-19. PTA from home with support of wife. Patient will need HH PT/OT and DME RW for home. Choice offered to wife and she chose [facility]. Referral made and SOC to begin within 24-48 hours post transition home. Referral sent to [facility] for DME [place]. Pt has a PCP and he gets medications without any problems. CM did discuss transportation to and from HD and wife states that patient will have adequate transportation. No further needs at this time.                 Expected Discharge Plan: [place] Barriers to Discharge: Continued Medical Work up(New to HD- Needs Clip.)   Patient Goals and CMS Choice Patient states their goals for this hospitalization and ongoing recovery are:: to get back home with family CMS Medicare.gov Compare Post Acute Care list provided to:: Patient Choice offered to / list presented to : Patient, Spouse  Expected Discharge Plan and Services Expected Discharge Plan: [place] In-house Referral: NA Discharge Planning Services: CM Consult Post Acute Care Choice: Home Health, Durable Medical Equipment Living arrangements for the past 2 months: [place] Stable home on sons property.)                 DME Arranged: Gilford Rile rolling DME Agency: [facility] Date DME Agency Contacted: 05/17/19 Time DME Agency Contacted: 74 Representative spoke with at DME Agency: [place] Arranged: PT, OT [place] Agency: [facility] Date [place]: 05/17/19 Time [place]: 83 Representative spoke with at [place]: Dorian Pod  Prior Living  Arrangements/Services Living arrangements for the past 2 months: [place] Stable home on sons property.) Lives with:: Spouse Patient language and need for interpreter reviewed:: Yes Do you feel safe going back to the place where you live?: Yes      Need for Family Participation in Patient Care: Yes (Comment) Care giver support system in place?: Yes (comment) Current home services: DME(Pt has CPAP and motorized scooter) Criminal Activity/Legal Involvement Pertinent to Current Situation/Hospitalization: No - Comment as needed  Activities of Daily Living Home Assistive Devices/Equipment: CPAP, CBG Meter ADL Screening (condition at time of admission) Patient's cognitive ability adequate to safely complete daily activities?: Yes Is the patient deaf or have difficulty hearing?: No Does the patient have difficulty seeing, even when wearing glasses/contacts?: No Does the patient have difficulty concentrating, remembering, or making decisions?: No Patient able to express need for assistance with ADLs?: Yes Does the patient have difficulty dressing or bathing?: Yes Independently performs ADLs?: No Communication: Independent Dressing (OT): Needs assistance Is this a change from baseline?: Pre-admission baseline Grooming: Needs assistance Is this a change from baseline?: Pre-admission baseline Feeding: Independent Bathing: Needs assistance Is this a change from baseline?: Pre-admission baseline In/Out Bed: Needs assistance Is this a change from baseline?: Pre-admission baseline Walks in Home: Independent with device (comment) Does the patient have difficulty walking or climbing stairs?: Yes Weakness of Legs: Both Weakness of Arms/Hands: Both  Permission Sought/Granted Permission sought to share information with : Facility Sport and exercise psychologist, Family Supports Permission granted to share information with :  Yes, Verbal Permission Granted  Share Information with NAME: Faysal Fenoglio  [phone]  Permission granted to share info w AGENCY: [facility], [facility]        Emotional Assessment Appearance:: Appears stated age Attitude/Demeanor/Rapport: Engaged Affect (typically observed): Accepting Orientation: : Oriented to Self, Oriented to Place, Oriented to  Time, Oriented to Situation Alcohol / Substance Use: Not Applicable Psych Involvement: No (comment)  Admission diagnosis:  Uremia [N19] Heme positive stool [R19.5] Elevated BUN [R79.9] Elevated troponin [R79.89] Acute UTI [N39.0] HCAP (healthcare-associated pneumonia) [J18.9] CRF (chronic renal failure), stage 4 (severe) (HCC) [N18.4] Symptomatic anemia [D64.9] Hypotension due to hypovolemia [I95.89, E86.1] Patient Active Problem List   Diagnosis Date Noted  . Atrial fibrillation ([place]) 05/14/2019  . Volume overload 05/14/2019  . Pulmonary hypertension ([place]) 05/14/2019  . Hypotension 05/12/2019  . Pneumonia 05/12/2019  . Acute respiratory failure ([place]) 05/12/2019  . Sepsis ([place]) 05/12/2019  . Acute lower UTI 05/12/2019  . GI bleed 05/10/2019  . Acute blood loss anemia 05/10/2019  . Amputated toe, left ([place]) 05/02/2019  . Onychomycosis 05/02/2019  . Venous insufficiency of both lower extremities 05/02/2019  . Hyperlipidemia associated with type 2 diabetes mellitus ([place]) 04/26/2019  . Osteomyelitis of third toe of left foot ([place]) 04/17/2019  . Osteomyelitis of second toe of left foot ([place]) 04/17/2019  . Bilateral primary osteoarthritis of knee 04/17/2019  . PCP NOTES >>>>>>>>>>>>>>> 03/01/2019  . Aortic stenosis 12/18/2018  . Iron deficiency anemia 09/09/2018  . S/P colostomy ([place]) 09/05/2016  . Vitamin D deficiency 04/26/2016  . Chronic renal failure in pediatric patient, stage 4 (severe) ([place]) 04/09/2015  . Diabetes mellitus type 2 with complications ([place]) 22/44/9753  . Essential hypertension 04/08/2015  . Hyponatremia 04/08/2015  . Hyperkalemia 05/22/2012   PCP:  Colon Branch,  MD Pharmacy:   [id number] [place], [place] [address] Phone: [phone] Fax: [place], [place] - [address] [place] [address] Phone: [phone] Fax: [phone]     Social Determinants of Health (SDOH) Interventions    Readmission Risk Interventions Readmission Risk Prevention Plan 05/17/2019  Transportation Screening Complete  PCP or Specialist Appt within 3-5 Days Complete  HRI or [place] Complete  Social Work Consult for [place] Planning/Counseling Complete  Palliative Care Screening Not Applicable  Medication Review Press photographer) Complete

## 2019-05-17 NOTE — Progress Notes (Signed)
Patient has been accepted for OP HD treatment at [facility] on a TTS schedule with a seat time of 12:30pm. Per Nephrology, plans for patient discharge over the weekend, and therefore she will start in the OP HD clinic on Tuesday, 05/22/19. He needs to arrive at 11:30am on Tuesday to sign intake paperwork. Renal Navigator spoke with patient's wife to inform. Clinic is aware of start date. Patient is cleared for discharge from an OP HD standpoint.  [address] [place], [place] [address] [phone]  Alphonzo Cruise, [place] Renal Navigator [phone]

## 2019-05-17 NOTE — Progress Notes (Signed)
[facility]  Subjective  - No new complaints   Objective (!) 146/61 (!) 51 (!) 97.4 F (36.3 C) (Axillary) 15 96%  Intake/Output Summary (Last 24 hours) at 05/17/2019 0837 Last data filed at 05/17/2019 5974 Gross per 24 hour  Intake 1223.12 ml  Output 2210 ml  Net -986.88 ml    Left UE incision healing well, palpable thrill in fistula Palpable radial pulse left UE, grip 5/5, sensation intact no sign of steal   Assessment/Planning: POD # s/p left AV fistula creation and TDC placement  Stable fistula creation with palpable thrill and no symptoms of steal. F/U in 5-6 weeks for fistula accessment and duplex.   Roxy Horseman 05/17/2019 8:37 AM --  Laboratory Lab Results: Recent Labs    05/16/19 0500 05/17/19 0351  WBC 8.6 7.6  HGB 8.2* 8.2*  HCT 24.9* 24.8*  PLT 195 165   BMET Recent Labs    05/16/19 0500 05/17/19 0351  NA 140 140  K 4.3 3.7  CL 104 106  CO2 21* 24  GLUCOSE 118* 115*  BUN 138* 104*  CREATININE 5.03* 3.80*  CALCIUM 8.6* 8.1*    COAG Lab Results  Component Value Date   INR 1.1 05/10/2019   No results found for: PTT

## 2019-05-17 NOTE — Anesthesia Postprocedure Evaluation (Signed)
Anesthesia Post Note  Patient: Louis Bradley  Procedure(s) Performed: INSERTION OF DIALYSIS CATHETER (Right Chest) ARTERIOVENOUS (AV) FISTULA CREATION (Left Arm Upper)     Patient location during evaluation: PACU Anesthesia Type: MAC Level of consciousness: awake and alert Pain management: pain level controlled Vital Signs Assessment: post-procedure vital signs reviewed and stable Respiratory status: spontaneous breathing Cardiovascular status: stable Anesthetic complications: no    Last Vitals:  Vitals:   05/17/19 0030 05/17/19 0414  BP: (!) 135/56 (!) 146/61  Pulse: (!) 53 (!) 51  Resp: 12 15  Temp: (!) 36.3 C (!) 36.3 C  SpO2: 97% 96%    Last Pain:  Vitals:   05/17/19 1030  TempSrc:   PainSc: 0-No pain                 Nolon Nations

## 2019-05-17 NOTE — Progress Notes (Addendum)
Progress Note  Patient Name: Louis Bradley Date of Encounter: 05/17/2019  Primary Cardiologist: Candee Furbish, MD   Subjective   No complaints, says he tolerated HD well yesterday.   Inpatient Medications    Scheduled Meds: . atorvastatin  20 mg Oral q1800  . calcitRIOL  0.25 mcg Oral Q M,W,F  . calcium acetate  667 mg Oral TID WC  . carvedilol  6.25 mg Oral BID WC  . Chlorhexidine Gluconate Cloth  6 each Topical Q0600  . furosemide  80 mg Oral BID  . guaiFENesin  600 mg Oral BID  . heparin      . insulin aspart  0-9 Units Subcutaneous TID WC  . pantoprazole (PROTONIX) IV  40 mg Intravenous Q12H  . sodium bicarbonate  650 mg Oral BID  . sodium chloride flush  3 mL Intravenous Q12H  . sodium zirconium cyclosilicate  10 g Oral Once per day on Mon Wed Fri   Continuous Infusions: . sodium chloride 10 mL/hr at 05/16/19 1953  . ceFEPime (MAXIPIME) IV 2 g (05/16/19 1700)  . doxycycline (VIBRAMYCIN) IV 100 mg (05/17/19 0537)   PRN Meds: acetaminophen **OR** acetaminophen, albuterol, lidocaine (PF), nicotine polacrilex, oxyCODONE-acetaminophen   Vital Signs    Vitals:   05/16/19 2330 05/17/19 0000 05/17/19 0030 05/17/19 0414  BP: (!) 136/50 127/60 (!) 135/56 (!) 146/61  Pulse: (!) 52 (!) 58 (!) 53 (!) 51  Resp: 16 (!) 21 12 15   Temp:   (!) 97.4 F (36.3 C) (!) 97.4 F (36.3 C)  TempSrc:   Oral Axillary  SpO2:   97% 96%  Weight:    128.5 kg  Height:        Intake/Output Summary (Last 24 hours) at 05/17/2019 0813 Last data filed at 05/17/2019 9528 Gross per 24 hour  Intake 1223.12 ml  Output 2210 ml  Net -986.88 ml   Last 3 Weights 05/17/2019 05/16/2019 05/16/2019  Weight (lbs) 283 lb 4.8 oz 288 lb 9.3 oz 288 lb 9.3 oz  Weight (kg) 128.504 kg 130.9 kg 130.9 kg      Telemetry    SB, occ PVCs - Personally Reviewed  ECG    N/a - Personally Reviewed  Physical Exam  Older WM, laying in bed.  GEN: No acute distress.   Neck: No JVD Cardiac: RRR, + 3/6 systolic  murmurs, rubs, or gallops.  Respiratory: Clear to auscultation bilaterally. HD temp cath to upper right chest GI: Soft, nontender, non-distended  MS: No edema; No deformity. Left AV fistula noted. Neuro:  Nonfocal  Psych: Normal affect   Labs    High Sensitivity Troponin:   Recent Labs  Lab 05/10/19 1924 05/10/19 2332 05/12/19 1434  TROPONINIHS 1,542* 2,052* 2,283*      Cardiac EnzymesNo results for input(s): TROPONINI in the last 168 hours. No results for input(s): TROPIPOC in the last 168 hours.   Chemistry Recent Labs  Lab 05/10/19 1924  05/12/19 1434  05/15/19 0513 05/16/19 0500 05/17/19 0351  NA  --    < > 132*   < > 138 140 140  K  --    < > 4.7   < > 4.8 4.3 3.7  CL  --    < > 101   < > 106 104 106  CO2  --    < > 18*   < > 21* 21* 24  GLUCOSE  --    < > 182*   < > 117* 118* 115*  BUN  --    < >  134*   < > 146* 138* 104*  CREATININE  --    < > 5.60*   < > 5.38* 5.03* 3.80*  CALCIUM  --    < > 7.8*   < > 8.1*  7.9* 8.6* 8.1*  PROT 6.1*  --  5.8*  --   --   --   --   ALBUMIN 3.2*  --  3.0*   < > 2.7* 2.8* 2.7*  AST 18  --  13*  --   --   --   --   ALT 19  --  15  --   --   --   --   ALKPHOS 52  --  50  --   --   --   --   BILITOT 0.5  --  0.4  --   --   --   --   GFRNONAA  --    < > 9*   < > 10* 11* 15*  GFRAA  --    < > 11*   < > 11* 12* 17*  ANIONGAP  --    < > 13   < > 11 15 10    < > = values in this interval not displayed.     Hematology Recent Labs  Lab 05/15/19 0513 05/15/19 1434 05/16/19 0500 05/17/19 0351  WBC 9.2  --  8.6 7.6  RBC 2.59*  --  2.83* 2.80*  HGB 7.7* 8.6* 8.2* 8.2*  HCT 23.1* 25.9* 24.9* 24.8*  MCV 89.2  --  88.0 88.6  MCH 29.7  --  29.0 29.3  MCHC 33.3  --  32.9 33.1  RDW 14.9  --  14.7 14.8  PLT 178  --  195 165    BNP Recent Labs  Lab 05/10/19 1924 05/12/19 1832  BNP 1,144.8* 1,225.2*     DDimer No results for input(s): DDIMER in the last 168 hours.   Radiology    Dg Chest 1 View  Result Date: 05/15/2019  CLINICAL DATA:  S/p right thoracentesis yielding 800 mL clear yellow fluid. EXAM: CHEST  1 VIEW COMPARISON:  05/13/2019 FINDINGS: Status post right thoracentesis. Bilateral mild interstitial thickening. Improved right perihilar airspace disease. No significant pleural effusion. No pneumothorax. Stable cardiomegaly. No acute osseous abnormality. IMPRESSION: 1. No right pneumothorax status post thoracentesis. 2. Cardiomegaly with mild pulmonary vascular congestion. Electronically Signed   By: Kathreen Devoid   On: 05/15/2019 14:23   Dg Chest Port 1 View  Result Date: 05/16/2019 CLINICAL DATA:  Status post dialysis catheter placement EXAM: PORTABLE CHEST 1 VIEW COMPARISON:  May 15, 2019 FINDINGS: There is a right-sided tunneled dialysis catheter with tip projecting over the distal SVC. The heart size is enlarged. There is no pneumothorax. No large pleural effusion. There is mild volume overload. No focal infiltrate. There is likely a minimal amount of atelectasis at the lung bases. IMPRESSION: 1. Dialysis catheter as above. 2. Cardiomegaly with mild volume overload. Electronically Signed   By: Constance Holster M.D.   On: 05/16/2019 17:02   Dg Fluoro Guide Cv Line Right  Result Date: 05/16/2019 CLINICAL DATA:  End-stage renal disease, catheter placement PROCEDURE: CENTRAL VENOUS CATHETER WITH FLUOROSCOPY FINDINGS: Single intraoperative fluoroscopic spot image documents tunneled right IJ hemodialysis catheter to the mid right atrium. Motion degrades pulmonary parenchymal evaluation. FLUOROSCOPY TIME:  See procedure report IMPRESSION: Right IJ tunneled hemodialysis catheter to the mid right atrium. Electronically Signed   By: Lucrezia Europe M.D.   On:  05/16/2019 14:12   Ir Thoracentesis Asp Pleural Space W/img Guide  Result Date: 05/15/2019 INDICATION: Patient with history of CKD, pulmonary HTN, CHF with complaints of dyspnea requiring supplemental oxygen. Imaging shows bilateral pleural effusions - request to IR  for diagnostic and therapeutic thoracentesis. EXAM: ULTRASOUND GUIDED RIGHT THORACENTESIS MEDICATIONS: 7 mL 1% lidocaine COMPLICATIONS: None immediate. PROCEDURE: An ultrasound guided thoracentesis was thoroughly discussed with the patient and questions answered. The benefits, risks, alternatives and complications were also discussed. The patient understands and wishes to proceed with the procedure. Written consent was obtained. Ultrasound was performed to localize and mark an adequate pocket of fluid in the right chest. The area was then prepped and draped in the normal sterile fashion. 1% Lidocaine was used for local anesthesia. Under ultrasound guidance a 6 Fr Safe-T-Centesis catheter was introduced. Thoracentesis was performed. The catheter was removed and a dressing applied. FINDINGS: A total of approximately 800 mL of clear yellow fluid was removed. Samples were sent to the laboratory as requested by the clinical team. IMPRESSION: Successful ultrasound guided right thoracentesis yielding 800 mL of pleural fluid. Read by Candiss Norse, PA-C Electronically Signed   By: Corrie Mckusick D.O.   On: 05/15/2019 13:10    Cardiac Studies   TTE: 05/11/19  IMPRESSIONS    1. The left ventricle has a visually estimated ejection fraction of 50%. The cavity size was mildly dilated. Left ventricular diastolic Doppler parameters are consistent with pseudonormalization. Elevated left ventricular end-diastolic pressure.  Hypokinesis of the apical anterior wall, the apical septal wall, and the true apex.  2. The right ventricle has normal systolic function. The cavity was normal. There is no increase in right ventricular wall thickness.  3. Left atrial size was moderately dilated.  4. There is moderate mitral annular calcification present. No evidence of mitral valve stenosis. Mild mitral regurgitation.  5. The aortic valve is tricuspid. Moderate calcification of the aortic valve. Moderate stenosis of the aortic  valve. Mean gradient 33 mmHg with AVA 1.29 cm^2.  6. The aortic root is normal in size and structure.  7. Right atrial size was mildly dilated.  8. Trivial pericardial effusion is present.  9. The inferior vena cava was dilated in size with <50% respiratory variability. PA systolic pressure 53 mmHg.  Patient Profile     73 y.o. male  with a hx of CKD stage V (not yet on HD, has undergone mapping), DM, Fournier gangrene, moderate AS, obesity, OSA on CPAP, HTN, HLD, L toe osteomyelitis, anemia, arthritis, recent NSTEMI, acute on chronic anemia and suspected GIB, pulmonary HTN by echo 04/2019 who is being seen today for the evaluation of NSTEMI at the request of Dr. Mannie Stabile. Telemetry also reveals incidental pickup of atrial fibrillation.  Assessment & Plan    1. Acute blood loss anemia superimposed on chronic anemia with UGI bleed - EGD on 6/28 showed gastritis/duodenitis without acute bleeding. Hgb now around 8 and stable for the past 3 days.   2. NSTEMI - unclear if demand process from profound anemia in context of respiratory failure/volume overload/aortic stenosis or if he has underlying CAD. The primary issue did not appear to be ACS driving his admission although his EKG did appear concerning for underlying coronary disease. His anemia has improved and stable around 8. Now with new AV fistula/temp dialysis cath placed yesterday. He has had no anginal symptoms. Would favor recovery from acute issues before pursuing further invasive procedures.   3. Acute on chronic volume overload suspected due to  combination of diastolic CHF in setting of end-stage renal disease - nephrology managing. Now with new AV fistula/temp dialysis cath and started dialysis.   4. Atrial fibrillation - newly recognized this admission, spontaneously converted to NSR. Continues in SR. CHADSVASC at least 4. H/H stable around 8 over the past 3 days. Now with newly placed HD sites. Would defer adding anticoagulation for now.  Readdress as an outpatient.   5. Moderate AS - likely contributing to symptoms. Will need close clinical f/u.  6. HTN - home amlodipine on hold, carvedilol decreased from 25->6.25 given hypotension this admission. Now noted to be bradycardiac on telemetry. Will further reduce to 3.125mg  BID today.   7. Pulmonary HTN - likely in setting of OSA, obesity, volume overload. Can follow clinically and consider repeat echo when consistently euvolemic.  8. CAP- remains on IV antibiotics per primary  For questions or updates, please contact [place] Please consult [email or web address] for contact info under        Signed, Reino Bellis, NP  05/17/2019, 8:13 AM    Patient seen, examined. Available data reviewed. Agree with findings, assessment, and plan as outlined by Reino Bellis, NP.  The patient is independently interviewed and evaluated.  He has done well with initiation of hemodialysis.  He has had no chest pain or pressure.  I reviewed our plan with him again today and he is comfortable with outpatient cardiology follow-up.  We will determine whether to proceed with invasive evaluation with cardiac catheterization based on stability of his hemoglobin and tolerance of hemodialysis.  I agree with reducing his carvedilol to prevent further bradycardia and hypotension.  [facility] will sign off.   Medication Recommendations:  Decrease carvedilol to 3.125 mg BID as above Other recommendations (labs, testing, etc):  none Follow up as an outpatient:  Will arrange outpatient follow-up with Dr Marlou Porch or his APP.    Sherren Mocha, M.D. 05/17/2019 12:56 PM

## 2019-05-17 NOTE — Progress Notes (Signed)
Pharmacy Antibiotic Note  Louis Bradley is a 73 y.o. male admitted on 05/12/2019 with sepsis.  Pharmacy has been consulted for cefepime dosing CAP, also on doxycycline per MD. Pt has had progressive renal insufficiency now requiring HD.  Plan: Adjust cefepime to 1g IV q24h Follow RRT plans, LOT, cultures  Height: 6\' 3"  (190.5 cm) Weight: 283 lb 4.8 oz (128.5 kg) IBW/kg (Calculated) : 84.5  Temp (24hrs), Avg:97.4 F (36.3 C), Min:97 F (36.1 C), Max:97.7 F (36.5 C)  Recent Labs  Lab 05/12/19 1438 05/12/19 1832 05/13/19 1153 05/14/19 0643 05/15/19 0513 05/16/19 0500 05/17/19 0351  WBC  --   --  10.9* 9.6 9.2 8.6 7.6  CREATININE  --   --  5.64* 5.54* 5.38* 5.03* 3.80*  LATICACIDVEN 0.8 0.8  --   --   --   --   --     Estimated Creatinine Clearance: 25 mL/min (A) (by C-G formula based on SCr of 3.8 mg/dL (H)).    No Known Allergies  Antimicrobials this admission: Vanc 6/27>>6/30 Cefepime 6/27>> Doxycycline 7/1 >>  Dose adjustments this admission: N/A  Microbiology results: 6/27 MBW:[id number] species. 6/27 BCx: >> NGTD 6/27 Sputum: ordered 6/27: COVID negative 6/27: Urine Strep Pneumo: negative  Thank you for allowing pharmacy to be a part of this patient's care.   Arrie Senate, PharmD, BCPS Clinical Pharmacist [phone] Please check AMION for all [place] numbers 05/17/2019

## 2019-05-17 NOTE — Progress Notes (Signed)
Renal Navigator received notification from Nephrologist/Dr. Carolin Sicks to initiate OP HD referral. Renal Navigator spoke with patient's wife/Lorin to complete assessment. Renal Navigator submitted referral to [facility] and will follow up with Nephrologist and patient's wife when seat schedule has been obtained.  Alphonzo Cruise,  Renal Navigator [phone]

## 2019-05-17 NOTE — Progress Notes (Signed)
Patient placed self on CPAP.  Patient resting well RT assistance not needed at this time.  RT will continue to monitor.

## 2019-05-17 NOTE — Progress Notes (Addendum)
[facility] NEPHROLOGY PROGRESS NOTE  Assessment/ Plan: Pt is a 73 y.o. yo male with history of hypertension, OSA, recent hospitalization for GI bleeding/NSTEMI presented with SOB and melena.  In ER patient was hypotensive to 80s, hemoglobin 6.9 and creatinine level of 5.6.  We are consulted for CKD management.  # CKD stage IV/V progressed to new ESRD:   -Status post right IJ TDC and left AV fistula created by Dr. Trula Slade on 7/1/120 and initiate dialysis.  He tolerated well.  Plan for second treatment today.  Hopefully he can go home tomorrow after the third dialysis treatment.  Outpatient HD arrangement ongoing. -dc lasix.  #Hyperkalemia: Potassium level normal.  Discontinue Lokelma.  #Severe chronic left-sided hydronephrosis likely due to chronic left UPJ obstruction: Seen by urologist, chronic in nature.  No intervention.  #Hypertension/volume: Blood pressure acceptable.  Continue Coreg.  Monitor blood pressure.  #Anemia due to GI bleed and CKD: Receiving blood transfusion.  Iron saturation 9 therefore received a dose of Feraheme and ESA on 6/29.  Order iron during HD for 5 doses.  #Metabolic bone disease: PTH level 127.  Continue PhosLo and calcitriol.  #Metabolic acidosis.  discontinue oral sodium bicarbonate.  #Respiratory failure possible HCAP: On cefepime and doxy per primary team.  #CHF/pulmonary hypertension: Diuretics as above.  Seen by cardiology, no intervention at this time.  Subjective: Seen and examined at bedside.  Tolerated dialysis well.  Denies headache, dizziness, nausea, vomiting, chest pain, shortness of breath. Objective Vital signs in last 24 hours: Vitals:   05/16/19 2330 05/17/19 0000 05/17/19 0030 05/17/19 0414  BP: (!) 136/50 127/60 (!) 135/56 (!) 146/61  Pulse: (!) 52 (!) 58 (!) 53 (!) 51  Resp: 16 (!) 21 12 15   Temp:   (!) 97.4 F (36.3 C) (!) 97.4 F (36.3 C)  TempSrc:   Oral Axillary  SpO2:   97% 96%  Weight:    128.5 kg  Height:        Weight change: 0 kg  Intake/Output Summary (Last 24 hours) at 05/17/2019 1249 Last data filed at 05/17/2019 1607 Gross per 24 hour  Intake 1223.12 ml  Output 2210 ml  Net -986.88 ml       Labs: Basic Metabolic Panel: Recent Labs  Lab 05/15/19 0513 05/16/19 0500 05/17/19 0351  NA 138 140 140  K 4.8 4.3 3.7  CL 106 104 106  CO2 21* 21* 24  GLUCOSE 117* 118* 115*  BUN 146* 138* 104*  CREATININE 5.38* 5.03* 3.80*  CALCIUM 8.1*  7.9* 8.6* 8.1*  PHOS 6.4* 5.5* 4.3   Liver Function Tests: Recent Labs  Lab 05/10/19 1924 05/12/19 1434  05/15/19 0513 05/16/19 0500 05/17/19 0351  AST 18 13*  --   --   --   --   ALT 19 15  --   --   --   --   ALKPHOS 52 50  --   --   --   --   BILITOT 0.5 0.4  --   --   --   --   PROT 6.1* 5.8*  --   --   --   --   ALBUMIN 3.2* 3.0*   < > 2.7* 2.8* 2.7*   < > = values in this interval not displayed.   No results for input(s): LIPASE, AMYLASE in the last 168 hours. No results for input(s): AMMONIA in the last 168 hours. CBC: Recent Labs  Lab 05/12/19 1434 05/13/19 1153  05/14/19 0643 05/15/19  0865 05/15/19 1434 05/16/19 0500 05/17/19 0351  WBC 12.5* 10.9*  --  9.6 9.2  --  8.6 7.6  NEUTROABS 11.3*  --   --  7.8*  --   --   --   --   HGB 6.9* 6.8*   < > 7.0* 7.7* 8.6* 8.2* 8.2*  HCT 21.7* 20.9*   < > 21.6* 23.1* 25.9* 24.9* 24.8*  MCV 91.6 90.5  --  91.5 89.2  --  88.0 88.6  PLT 181 170  --  177 178  --  195 165   < > = values in this interval not displayed.   Cardiac Enzymes: No results for input(s): CKTOTAL, CKMB, CKMBINDEX, TROPONINI in the last 168 hours. CBG: Recent Labs  Lab 05/16/19 1515 05/16/19 1601 05/16/19 2140 05/17/19 0742 05/17/19 1127  GLUCAP 129* 126* 150* 123* 129*    Iron Studies:  No results for input(s): IRON, TIBC, TRANSFERRIN, FERRITIN in the last 72 hours. Studies/Results: Dg Chest 1 View  Result Date: 05/15/2019 CLINICAL DATA:  S/p right thoracentesis yielding 800 mL clear yellow  fluid. EXAM: CHEST  1 VIEW COMPARISON:  05/13/2019 FINDINGS: Status post right thoracentesis. Bilateral mild interstitial thickening. Improved right perihilar airspace disease. No significant pleural effusion. No pneumothorax. Stable cardiomegaly. No acute osseous abnormality. IMPRESSION: 1. No right pneumothorax status post thoracentesis. 2. Cardiomegaly with mild pulmonary vascular congestion. Electronically Signed   By: Kathreen Devoid   On: 05/15/2019 14:23   Dg Chest Port 1 View  Result Date: 05/16/2019 CLINICAL DATA:  Status post dialysis catheter placement EXAM: PORTABLE CHEST 1 VIEW COMPARISON:  May 15, 2019 FINDINGS: There is a right-sided tunneled dialysis catheter with tip projecting over the distal SVC. The heart size is enlarged. There is no pneumothorax. No large pleural effusion. There is mild volume overload. No focal infiltrate. There is likely a minimal amount of atelectasis at the lung bases. IMPRESSION: 1. Dialysis catheter as above. 2. Cardiomegaly with mild volume overload. Electronically Signed   By: Constance Holster M.D.   On: 05/16/2019 17:02   Dg Fluoro Guide Cv Line Right  Result Date: 05/16/2019 CLINICAL DATA:  End-stage renal disease, catheter placement PROCEDURE: CENTRAL VENOUS CATHETER WITH FLUOROSCOPY FINDINGS: Single intraoperative fluoroscopic spot image documents tunneled right IJ hemodialysis catheter to the mid right atrium. Motion degrades pulmonary parenchymal evaluation. FLUOROSCOPY TIME:  See procedure report IMPRESSION: Right IJ tunneled hemodialysis catheter to the mid right atrium. Electronically Signed   By: Lucrezia Europe M.D.   On: 05/16/2019 14:12   Ir Thoracentesis Asp Pleural Space W/img Guide  Result Date: 05/15/2019 INDICATION: Patient with history of CKD, pulmonary HTN, CHF with complaints of dyspnea requiring supplemental oxygen. Imaging shows bilateral pleural effusions - request to IR for diagnostic and therapeutic thoracentesis. EXAM: ULTRASOUND GUIDED  RIGHT THORACENTESIS MEDICATIONS: 7 mL 1% lidocaine COMPLICATIONS: None immediate. PROCEDURE: An ultrasound guided thoracentesis was thoroughly discussed with the patient and questions answered. The benefits, risks, alternatives and complications were also discussed. The patient understands and wishes to proceed with the procedure. Written consent was obtained. Ultrasound was performed to localize and mark an adequate pocket of fluid in the right chest. The area was then prepped and draped in the normal sterile fashion. 1% Lidocaine was used for local anesthesia. Under ultrasound guidance a 6 Fr Safe-T-Centesis catheter was introduced. Thoracentesis was performed. The catheter was removed and a dressing applied. FINDINGS: A total of approximately 800 mL of clear yellow fluid was removed. Samples were sent to the  laboratory as requested by the clinical team. IMPRESSION: Successful ultrasound guided right thoracentesis yielding 800 mL of pleural fluid. Read by Candiss Norse, PA-C Electronically Signed   By: Corrie Mckusick D.O.   On: 05/15/2019 13:10    Medications: Infusions: . sodium chloride 10 mL/hr at 05/16/19 1953  . ceFEPime (MAXIPIME) IV    . doxycycline (VIBRAMYCIN) IV 100 mg (05/17/19 0537)    Scheduled Medications: . atorvastatin  20 mg Oral q1800  . calcitRIOL  0.25 mcg Oral Q M,W,F  . calcium acetate  667 mg Oral TID WC  . carvedilol  3.125 mg Oral BID WC  . Chlorhexidine Gluconate Cloth  6 each Topical Q0600  . Chlorhexidine Gluconate Cloth  6 each Topical Q0600  . furosemide  80 mg Oral BID  . guaiFENesin  600 mg Oral BID  . insulin aspart  0-9 Units Subcutaneous TID WC  . pantoprazole (PROTONIX) IV  40 mg Intravenous Q12H  . sodium bicarbonate  650 mg Oral BID  . sodium chloride flush  3 mL Intravenous Q12H  . sodium zirconium cyclosilicate  10 g Oral Once per day on Mon Wed Fri    have reviewed scheduled and prn medications.  Physical Exam: General: Lying on bed  comfortable, not in distress Heart:RRR, s1s2 nl, no rubs Lungs: Clear b/l, no wheezing Abdomen:soft, Non-tender, non-distended Extremities: Lower extremity edema trace Neurology: Alert, awake, following commands. Access: Right IJ TDC and left AV fistula has a good thrill and bruit.   Tanna Furry 05/17/2019,12:48 PM  LOS: 5 days  Pager: [phone]

## 2019-05-17 NOTE — Progress Notes (Signed)
PROGRESS NOTE    Louis Bradley  TIW:[id number] DOB: Mar 21, 1946 DOA: 05/12/2019 PCP: Colon Branch, MD   Brief Narrative:  Patient is a 73 year old male with history of HTN, CKD-4, OSA on CPAP, recent hospitalization from 6/25-6/26 for GIB/NSTEMI presenting with generalized weakness, shortness of breath, weight cough and melena. Reportedly declined EGD and LHC at his recent hospitalization for GIB and NSTEMI and went home. In ED, he was found to have low BP up to  88/49 ,  Hgb 6.9 .  BUN 134.  Creatinine 5.6. .  BNP 1225 (about the same on 6/25).  Lactic acid 0.8.  Urinalysis suggestive for UTI.  CXR concerning for multifocal airspace opacities concerning for pneumonia.  FOBT positive. Patient was started on vancomycin and cefepime empirically for pneumonia and UTI.    Cardiology, GI, nephrology consulted. Underwent upper GI endoscopy without any clear finding of active bleeding.  Started on Protonix.  Nephrology started on dialysis and he has underwent  dialysis catheter placement and AV fistula.  Cardiology did not recommend acute intervention for now.  Now waiting for outpatient dialysis set up.  Assessment & Plan:   Principal Problem:   Sepsis ([place]) Active Problems:   Diabetes mellitus type 2 with complications (HCC)   GI bleed   Acute blood loss anemia   Hypotension   Pneumonia   Acute respiratory failure (HCC)   Acute lower UTI   Atrial fibrillation (HCC)   Volume overload   Pulmonary hypertension (HCC)   Acute respiratory failure: Multifactorial including acute on chronic CHF, non-STEMI, pleural effusion, anemia and possible pneumonia.  Chest x-ray showed possibe CHF versus pneumonia.  Respiratory status stable currently. Saturating fine on  room air today.  Acute on chronic combined CHF/moderate pulmonary hypertension: Presented with dspnea.  CXR concerning for CHF.  CT abdomen reveals moderate to large pleural effusion, right greater than left and other findings suggestive  for CHF.  Currently respiratory status stable.  Lasix discontinued.  He will follow-up with cardiology as an outpatient.  Moderate to large bilateral pleural effusion, right greater than left: Likely due to fluid overload from CHF and renal failure versus infectious process.  Initiated  dialysis with improvement in the respiratory function.  Symptomatic anemia due to GI bleed/iron deficiency anemia:  He had melanotic stool on admission.  Iron saturation 9% -Had EGD on 6/28 that revealed gastritis and duodenitis but no active bleed.  Currently hemoglobin stable.Continue PPI.S/p iron infusion  Suspected Pneumonia:   CXR on 05/12/19 showed bibasilar airspace consolidation, possible multifocal pneumonia   Cultures negative so far.  COVID-19 screen negative.  Procalcitonin marginal.  CT abdomen reveals RML opacity which could also be due to fluid overload. Sores have been negative.  He has completed 5-day course of antibiotics treatment.  Antibiotics will be stopped now.  Non-STEMI: Troponin elevated.  EKG with some T wave inversions which seems to be old. No anticoagulation due to GI bleed Carvedilol resumed at a lower dose.Continue statin. Cardiology consulted.  No plan for Invasive procedure  for now.  He will follow-up with cardiology as an outpatient.  CKD-5/azotemia: Nephrology following, Underwent  temporary dialysis catheter placement and AV graft placement by vascular surgery. Next dialysis tomorrow. Now awaiting outpatient dialysis set up.  Severe chronic left hydronephrosis with cortical thinning of left kidney:  -Urology, Dr. Rush Landmark consulted on 6/30.No plan for any intervention  Mechanical fall at home Right shoulder pain -Patient has some pain in right shoulder with limited range of motion -Shoulder  x-ray without fracture dislocation. -Orthopedics consulted on 6/30 and recommended NSAIDs, gentle motion exercises and follow-up with Dr. Sharol Given in 2 weeks.  Well-controlled NIDDM-2  with renal complication.   -On glipizide at home. -SSI  OSA on CPAP:  -nightly CPAP  Recent right toes amputation: Stable -Stitch removed on 6/29 here -Patient evaluated physical therapy and recommended home health on discharge.          DVT prophylaxis: SCD Code Status: Full Family Communication: Called family for update Disposition Plan: Likely home tomorrow   Consultants: Nephrology, cardiology  Procedures: Echo  Antimicrobials:  Anti-infectives (From admission, onward)   Start     Dose/Rate Route Frequency Ordered Stop   05/17/19 1700  ceFEPIme (MAXIPIME) 1 g in sodium chloride 0.9 % 100 mL IVPB  Status:  Discontinued     1 g 200 mL/hr over 30 Minutes Intravenous Every 24 hours 05/17/19 1105 05/17/19 1451   05/17/19 0600  cefUROXime (ZINACEF) 1.5 g in sodium chloride 0.9 % 100 mL IVPB     1.5 g 200 mL/hr over 30 Minutes Intravenous To Short Stay 05/16/19 0926 05/16/19 1318   05/14/19 1800  vancomycin (VANCOCIN) IVPB 1000 mg/200 mL premix  Status:  Discontinued     1,000 mg 200 mL/hr over 60 Minutes Intravenous Every 48 hours 05/12/19 1633 05/13/19 1119   05/13/19 1800  vancomycin (VANCOCIN) IVPB 1000 mg/200 mL premix  Status:  Discontinued     1,000 mg 200 mL/hr over 60 Minutes Intravenous Every 48 hours 05/12/19 1632 05/12/19 1633   05/13/19 1700  ceFEPIme (MAXIPIME) 2 g in sodium chloride 0.9 % 100 mL IVPB  Status:  Discontinued     2 g 200 mL/hr over 30 Minutes Intravenous Every 24 hours 05/12/19 1632 05/17/19 1105   05/13/19 1700  doxycycline (VIBRAMYCIN) 100 mg in sodium chloride 0.9 % 250 mL IVPB  Status:  Discontinued     100 mg 125 mL/hr over 120 Minutes Intravenous Every 12 hours 05/13/19 1654 05/17/19 1451   05/12/19 1630  vancomycin (VANCOCIN) 2,500 mg in sodium chloride 0.9 % 500 mL IVPB     2,500 mg 250 mL/hr over 120 Minutes Intravenous  Once 05/12/19 1626 05/12/19 2030   05/12/19 1615  ceFEPIme (MAXIPIME) 2 g in sodium chloride 0.9 % 100 mL IVPB      2 g 200 mL/hr over 30 Minutes Intravenous  Once 05/12/19 1604 05/12/19 1732      Subjective:  Patient seen and examined the bedside this morning.  Remains comfortable.  Hemodynamically stable.  Denies any complaints  Objective: Vitals:   05/16/19 2330 05/17/19 0000 05/17/19 0030 05/17/19 0414  BP: (!) 136/50 127/60 (!) 135/56 (!) 146/61  Pulse: (!) 52 (!) 58 (!) 53 (!) 51  Resp: 16 (!) 21 12 15   Temp:   (!) 97.4 F (36.3 C) (!) 97.4 F (36.3 C)  TempSrc:   Oral Axillary  SpO2:   97% 96%  Weight:    128.5 kg  Height:        Intake/Output Summary (Last 24 hours) at 05/17/2019 1451 Last data filed at 05/17/2019 4709 Gross per 24 hour  Intake 490 ml  Output 2200 ml  Net -1710 ml   Filed Weights   05/16/19 0500 05/16/19 1138 05/17/19 0414  Weight: 130.9 kg 130.9 kg 128.5 kg    Examination:   General exam: Appears calm and comfortable ,Not in distress,average built Respiratory system: Bilateral equal air entry, normal vesicular breath sounds, no wheezes or crackles  Cardiovascular system: S1 & S2 heard, RRR. No JVD, murmurs, rubs, gallops or clicks.  Temporary dialysis catheter on the right chest Gastrointestinal system: Abdomen is nondistended, soft and nontender. No organomegaly or masses felt. Normal bowel sounds heard. Central nervous system: Alert and oriented. No focal neurological deficits. Extremities: No edema, no clubbing ,no cyanosis, distal peripheral pulses palpable.  AV graft on the left arm Skin: No rashes, lesions or ulcers,no icterus ,no pallor    Data Reviewed: I have personally reviewed following labs and imaging studies  CBC: Recent Labs  Lab 05/12/19 1434 05/13/19 1153  05/14/19 0643 05/15/19 0513 05/15/19 1434 05/16/19 0500 05/17/19 0351  WBC 12.5* 10.9*  --  9.6 9.2  --  8.6 7.6  NEUTROABS 11.3*  --   --  7.8*  --   --   --   --   HGB 6.9* 6.8*   < > 7.0* 7.7* 8.6* 8.2* 8.2*  HCT 21.7* 20.9*   < > 21.6* 23.1* 25.9* 24.9* 24.8*  MCV  91.6 90.5  --  91.5 89.2  --  88.0 88.6  PLT 181 170  --  177 178  --  195 165   < > = values in this interval not displayed.   Basic Metabolic Panel: Recent Labs  Lab 05/13/19 1153 05/14/19 0643 05/15/19 0513 05/16/19 0500 05/17/19 0351  NA 136 139 138 140 140  K 4.7 4.4 4.8 4.3 3.7  CL 106 107 106 104 106  CO2 19* 19* 21* 21* 24  GLUCOSE 140* 89 117* 118* 115*  BUN 139* 146* 146* 138* 104*  CREATININE 5.64* 5.54* 5.38* 5.03* 3.80*  CALCIUM 7.7* 7.6* 8.1*  7.9* 8.6* 8.1*  MG 3.2* 3.0* 2.9* 3.3* 2.2  PHOS 6.0* 6.9* 6.4* 5.5* 4.3   GFR: Estimated Creatinine Clearance: 25 mL/min (A) (by C-G formula based on SCr of 3.8 mg/dL (H)). Liver Function Tests: Recent Labs  Lab 05/10/19 1924 05/12/19 1434 05/13/19 1153 05/14/19 0643 05/15/19 0513 05/16/19 0500 05/17/19 0351  AST 18 13*  --   --   --   --   --   ALT 19 15  --   --   --   --   --   ALKPHOS 52 50  --   --   --   --   --   BILITOT 0.5 0.4  --   --   --   --   --   PROT 6.1* 5.8*  --   --   --   --   --   ALBUMIN 3.2* 3.0* 2.8* 2.6* 2.7* 2.8* 2.7*   No results for input(s): LIPASE, AMYLASE in the last 168 hours. No results for input(s): AMMONIA in the last 168 hours. Coagulation Profile: Recent Labs  Lab 05/10/19 1924  INR 1.1   Cardiac Enzymes: No results for input(s): CKTOTAL, CKMB, CKMBINDEX, TROPONINI in the last 168 hours. BNP (last 3 results) No results for input(s): PROBNP in the last 8760 hours. HbA1C: No results for input(s): HGBA1C in the last 72 hours. CBG: Recent Labs  Lab 05/16/19 1515 05/16/19 1601 05/16/19 2140 05/17/19 0742 05/17/19 1127  GLUCAP 129* 126* 150* 123* 129*   Lipid Profile: No results for input(s): CHOL, HDL, LDLCALC, TRIG, CHOLHDL, LDLDIRECT in the last 72 hours. Thyroid Function Tests: Recent Labs    05/15/19 0513  TSH 0.965   Anemia Panel: No results for input(s): VITAMINB12, FOLATE, FERRITIN, TIBC, IRON, RETICCTPCT in the last 72 hours. Sepsis Labs: Recent  Labs  Lab 05/12/19 1438 05/12/19 1832 05/13/19 1153  PROCALCITON  --   --  0.20  LATICACIDVEN 0.8 0.8  --     Recent Results (from the past 240 hour(s))  Novel Coronavirus,NAA,(SEND-OUT TO REF LAB - TAT 24-48 hrs); Hosp Order     Status: None   Collection Time: 05/10/19  7:39 PM   Specimen: Nasopharyngeal Swab; Respiratory  Result Value Ref Range Status   SARS-CoV-2, NAA NOT DETECTED NOT DETECTED Final    Comment: (NOTE) This test was developed and its performance characteristics determined by [employer]. This test has not been FDA cleared or approved. This test has been authorized by FDA under an Emergency Use Authorization (EUA). This test is only authorized for the duration of time the declaration that circumstances exist justifying the authorization of the emergency use of in vitro diagnostic tests for detection of SARS-CoV-2 virus and/or diagnosis of COVID-19 infection under section 564(b)(1) of the Act, 21 U.S.C. 725DGU-4(Q)(0), unless the authorization is terminated or revoked sooner. When diagnostic testing is negative, the possibility of a false negative result should be considered in the context of a patient's recent exposures and the presence of clinical signs and symptoms consistent with COVID-19. An individual without symptoms of COVID-19 and who is not shedding SARS-CoV-2 virus would expect to have a negative (not detected) result in this assay. Performed  At: [facility] [address] [place], [place] [id number] Rush Farmer MD [id number]    Antoine  Final    Comment: Performed at [facility], [place] [address], [place], [place] [address]  SARS Coronavirus 2 (CEPHEID- Performed in [place] hospital lab), Hosp Order     Status: None   Collection Time: 05/12/19  2:34 PM   Specimen: Nasopharyngeal Swab  Result Value Ref Range Status   SARS Coronavirus 2 NEGATIVE NEGATIVE Final    Comment: (NOTE) If  result is NEGATIVE SARS-CoV-2 target nucleic acids are NOT DETECTED. The SARS-CoV-2 RNA is generally detectable in upper and lower  respiratory specimens during the acute phase of infection. The lowest  concentration of SARS-CoV-2 viral copies this assay can detect is 250  copies / mL. A negative result does not preclude SARS-CoV-2 infection  and should not be used as the sole basis for treatment or other  patient management decisions.  A negative result may occur with  improper specimen collection / handling, submission of specimen other  than nasopharyngeal swab, presence of viral mutation(s) within the  areas targeted by this assay, and inadequate number of viral copies  (<250 copies / mL). A negative result must be combined with clinical  observations, patient history, and epidemiological information. If result is POSITIVE SARS-CoV-2 target nucleic acids are DETECTED. The SARS-CoV-2 RNA is generally detectable in upper and lower  respiratory specimens dur ing the acute phase of infection.  Positive  results are indicative of active infection with SARS-CoV-2.  Clinical  correlation with patient history and other diagnostic information is  necessary to determine patient infection status.  Positive results do  not rule out bacterial infection or co-infection with other viruses. If result is PRESUMPTIVE POSTIVE SARS-CoV-2 nucleic acids MAY BE PRESENT.   A presumptive positive result was obtained on the submitted specimen  and confirmed on repeat testing.  While 2019 novel coronavirus  (SARS-CoV-2) nucleic acids may be present in the submitted sample  additional confirmatory testing may be necessary for epidemiological  and / or clinical management purposes  to differentiate between  SARS-CoV-2 and other Sarbecovirus currently known to infect humans.  If clinically indicated additional testing with an alternate test  methodology [phone]) is advised. The SARS-CoV-2 RNA is generally   detectable in upper and lower respiratory sp ecimens during the acute  phase of infection. The expected result is Negative. Fact Sheet for Patients:  StrictlyIdeas.no Fact Sheet for Healthcare Providers: BankingDealers.co.za This test is not yet approved or cleared by the [place] FDA and has been authorized for detection and/or diagnosis of SARS-CoV-2 by FDA under an Emergency Use Authorization (EUA).  This EUA will remain in effect (meaning this test can be used) for the duration of the COVID-19 declaration under Section 564(b)(1) of the Act, 21 U.S.C. section 360bbb-3(b)(1), unless the authorization is terminated or revoked sooner. Performed at [facility], [place] [address] [place], [place] [address]   Blood Culture (routine x 2)     Status: None   Collection Time: 05/12/19  2:45 PM   Specimen: BLOOD  Result Value Ref Range Status   Specimen Description BLOOD RIGHT ANTECUBITAL  Final   Special Requests   Final    BOTTLES DRAWN AEROBIC AND ANAEROBIC Blood Culture results may not be optimal due to an excessive volume of blood received in culture bottles   Culture   Final    NO GROWTH 5 DAYS Performed at [facility], [place] [address], [place], [place] [address]    Report Status 05/17/2019 FINAL  Final  Blood Culture (routine x 2)     Status: None   Collection Time: 05/12/19  3:09 PM   Specimen: BLOOD  Result Value Ref Range Status   Specimen Description BLOOD LEFT ANTECUBITAL  Final   Special Requests   Final    BOTTLES DRAWN AEROBIC AND ANAEROBIC Blood Culture adequate volume   Culture   Final    NO GROWTH 5 DAYS Performed at [facility], [place] [address] [place], [place] [address]    Report Status 05/17/2019 FINAL  Final  Urine Culture     Status: Abnormal   Collection Time: 05/12/19  4:00 PM   Specimen: Urine, Random  Result Value Ref Range Status   Specimen Description URINE, RANDOM  Final   Special  Requests   Final    NONE Performed at [facility], [place] [address], [place], [place] [address]    Culture MULTIPLE SPECIES PRESENT, SUGGEST RECOLLECTION (A)  Final   Report Status 05/14/2019 FINAL  Final  Gram stain     Status: None   Collection Time: 05/15/19  1:00 PM   Specimen: Pleural, Right; Pleural Fluid  Result Value Ref Range Status   Specimen Description PLEURAL  Final   Special Requests FLUID  Final   Gram Stain   Final    WBC PRESENT,BOTH PMN AND MONONUCLEAR NO ORGANISMS SEEN CYTOSPIN SMEAR Performed at [facility], [place] [address] [place],  [address]    Report Status 05/15/2019 FINAL  Final  Acid Fast Smear (AFB)     Status: None   Collection Time: 05/15/19  1:00 PM   Specimen: Pleural, Right; Pleural Fluid  Result Value Ref Range Status   AFB Specimen Processing Concentration  Final   Acid Fast Smear Negative  Final    Comment: (NOTE) Performed At: [facility] [place], [place] [id number] Rush Farmer MD [id number]    Source (AFB) FLUID  Final    Comment: Performed at [facility], [address] [place], [place] [address]  Culture, body fluid-bottle     Status: None (Preliminary result)   Collection Time: 05/15/19  1:02 PM   Specimen: Pleura  Result Value Ref Range Status   Specimen Description PLEURAL  Final   Special Requests FLUID  Final   Culture   Final    NO GROWTH 2 DAYS Performed at [facility], [address] [place], [place] [address]    Report Status PENDING  Incomplete  Surgical pcr screen     Status: None   Collection Time: 05/16/19 10:31 AM   Specimen: Nasal Mucosa; Nasal Swab  Result Value Ref Range Status   MRSA, PCR NEGATIVE NEGATIVE Final   Staphylococcus aureus NEGATIVE NEGATIVE Final    Comment: (NOTE) The Xpert SA Assay (FDA approved for NASAL specimens in patients 79 years of age and older), is one component of a comprehensive surveillance program. It is not intended  to diagnose infection nor to guide or monitor treatment. Performed at [facility], [place] [address] [place], [place] [address]          Radiology Studies: Dg Chest Port 1 View  Result Date: 05/16/2019 CLINICAL DATA:  Status post dialysis catheter placement EXAM: PORTABLE CHEST 1 VIEW COMPARISON:  May 15, 2019 FINDINGS: There is a right-sided tunneled dialysis catheter with tip projecting over the distal SVC. The heart size is enlarged. There is no pneumothorax. No large pleural effusion. There is mild volume overload. No focal infiltrate. There is likely a minimal amount of atelectasis at the lung bases. IMPRESSION: 1. Dialysis catheter as above. 2. Cardiomegaly with mild volume overload. Electronically Signed   By: Constance Holster M.D.   On: 05/16/2019 17:02   Dg Fluoro Guide Cv Line Right  Result Date: 05/16/2019 CLINICAL DATA:  End-stage renal disease, catheter placement PROCEDURE: CENTRAL VENOUS CATHETER WITH FLUOROSCOPY FINDINGS: Single intraoperative fluoroscopic spot image documents tunneled right IJ hemodialysis catheter to the mid right atrium. Motion degrades pulmonary parenchymal evaluation. FLUOROSCOPY TIME:  See procedure report IMPRESSION: Right IJ tunneled hemodialysis catheter to the mid right atrium. Electronically Signed   By: Lucrezia Europe M.D.   On: 05/16/2019 14:12        Scheduled Meds: . atorvastatin  20 mg Oral q1800  . calcitRIOL  0.25 mcg Oral Q M,W,F  . calcium acetate  667 mg Oral TID WC  . carvedilol  3.125 mg Oral BID WC  . Chlorhexidine Gluconate Cloth  6 each Topical Q0600  . Chlorhexidine Gluconate Cloth  6 each Topical Q0600  . [START ON 05/18/2019] Chlorhexidine Gluconate Cloth  6 each Topical Q0600  . guaiFENesin  600 mg Oral BID  . insulin aspart  0-9 Units Subcutaneous TID WC  . pantoprazole  40 mg Oral BID  . sodium chloride flush  3 mL Intravenous Q12H   Continuous Infusions: . sodium chloride 10 mL/hr at 05/16/19 1953  . [START ON  05/18/2019] ferric gluconate (FERRLECIT/NULECIT) IV       LOS: 5 days    Time spent:35 mins. More than 50% of that time was spent in counseling and/or coordination of care.      Shelly Coss, MD [facility] Pager [phone]  If 7PM-7AM, please contact night-coverage [email or web address] Password [email or web address] 05/17/2019, 2:51 PM

## 2019-05-18 LAB — CBC
HCT: 24 % — ABNORMAL LOW (ref 39.0–52.0)
Hemoglobin: 7.9 g/dL — ABNORMAL LOW (ref 13.0–17.0)
MCH: 30 pg (ref 26.0–34.0)
MCHC: 32.9 g/dL (ref 30.0–36.0)
MCV: 91.3 fL (ref 80.0–100.0)
Platelets: 159 10*3/uL (ref 150–400)
RBC: 2.63 MIL/uL — ABNORMAL LOW (ref 4.22–5.81)
RDW: 15.3 % (ref 11.5–15.5)
WBC: 9.4 10*3/uL (ref 4.0–10.5)
nRBC: 0 % (ref 0.0–0.2)

## 2019-05-18 LAB — GLUCOSE, CAPILLARY: Glucose-Capillary: 142 mg/dL — ABNORMAL HIGH (ref 70–99)

## 2019-05-18 LAB — MAGNESIUM: Magnesium: 2.1 mg/dL (ref 1.7–2.4)

## 2019-05-18 MED ORDER — CALCITRIOL 0.25 MCG PO CAPS
ORAL_CAPSULE | ORAL | Status: AC
  Administered 2019-05-18: 0.25 ug via ORAL
  Filled 2019-05-18: qty 1

## 2019-05-18 MED ORDER — CARVEDILOL 3.125 MG PO TABS: 3.1250 mg | ORAL_TABLET | Freq: Two times a day (BID) | ORAL | 0 refills | Status: DC

## 2019-05-18 MED ORDER — HEPARIN SODIUM (PORCINE) 1000 UNIT/ML IJ SOLN
INTRAMUSCULAR | Status: AC
  Administered 2019-05-18: 3200 [IU] via INTRAVENOUS_CENTRAL
  Filled 2019-05-18: qty 4

## 2019-05-18 MED ORDER — CALCIUM ACETATE (PHOS BINDER) 667 MG PO CAPS: 667.0000 mg | ORAL_CAPSULE | Freq: Three times a day (TID) | ORAL | 0 refills | Status: AC

## 2019-05-18 NOTE — Progress Notes (Addendum)
[facility] NEPHROLOGY PROGRESS NOTE  Assessment/ Plan: Pt is a 73 y.o. yo male with history of hypertension, OSA, recent hospitalization for GI bleeding/NSTEMI presented with SOB and melena.  In ER patient was hypotensive to 80s, hemoglobin 6.9 and creatinine level of 5.6.  We are consulted for CKD management.  # CKD stage IV/V progressed to new ESRD:   -Status post right IJ TDC and left AV fistula created by Dr. Trula Slade on 7/1/120 and initiate dialysis.   -He tolerated third dialysis today.  He feels good and stable.  Outpatient HD set up at [facility] [place] clinic on TTS, time 12:30 PM.  He is being discharged today.  I recommend patient to do fluid and salt restriction this weekend and take Lasix 80 mg every day.  #Hyperkalemia: Potassium level improved.  Discontinue Lokelma.  #Severe chronic left-sided hydronephrosis likely due to chronic left UPJ obstruction: Seen by urologist, chronic in nature.  No intervention.  #Hypertension/volume: Blood pressure acceptable.  Continue Coreg.  Monitor blood pressure.  #Anemia due to GI bleed and CKD: Received blood transfusion.  Iron saturation 9 therefore received a dose of Feraheme and ESA on 6/29.  Order iron during HD for 5 doses.  #Metabolic bone disease: PTH level 127.  Continue PhosLo and calcitriol.  #Metabolic acidosis.  discontinue oral sodium bicarbonate.  #Respiratory failure possible HCAP: Completed antibiotics.  #CHF/pulmonary hypertension:  Seen by cardiology, no intervention at this time.  Subjective: Seen and examined at dialysis unit.  Tolerating well.  Feels good and eager to go home today.  Denies nausea, vomiting, chest pain, shortness of breath. Objective Vital signs in last 24 hours: Vitals:   05/18/19 0900 05/18/19 0930 05/18/19 1000 05/18/19 1027  BP: (!) 126/42 (!) 123/44 (!) 118/55 (!) 109/54  Pulse: 68 (!) 59 (!) 59 61  Resp: 14 19 19 17   Temp:    98.4 F (36.9 C)  TempSrc:    Oral  SpO2:     100%  Weight:    122.2 kg  Height:       Weight change: -4.8 kg  Intake/Output Summary (Last 24 hours) at 05/18/2019 1118 Last data filed at 05/18/2019 1112 Gross per 24 hour  Intake 240 ml  Output 4900 ml  Net -4660 ml       Labs: Basic Metabolic Panel: Recent Labs  Lab 05/15/19 0513 05/16/19 0500 05/17/19 0351  NA 138 140 140  K 4.8 4.3 3.7  CL 106 104 106  CO2 21* 21* 24  GLUCOSE 117* 118* 115*  BUN 146* 138* 104*  CREATININE 5.38* 5.03* 3.80*  CALCIUM 8.1*  7.9* 8.6* 8.1*  PHOS 6.4* 5.5* 4.3   Liver Function Tests: Recent Labs  Lab 05/12/19 1434  05/15/19 0513 05/16/19 0500 05/17/19 0351  AST 13*  --   --   --   --   ALT 15  --   --   --   --   ALKPHOS 50  --   --   --   --   BILITOT 0.4  --   --   --   --   PROT 5.8*  --   --   --   --   ALBUMIN 3.0*   < > 2.7* 2.8* 2.7*   < > = values in this interval not displayed.   No results for input(s): LIPASE, AMYLASE in the last 168 hours. No results for input(s): AMMONIA in the last 168 hours. CBC: Recent Labs  Lab 05/12/19  1434  05/14/19 0643 05/15/19 0513  05/16/19 0500 05/17/19 0351 05/18/19 0500  WBC 12.5*   < > 9.6 9.2  --  8.6 7.6 9.4  NEUTROABS 11.3*  --  7.8*  --   --   --   --   --   HGB 6.9*   < > 7.0* 7.7*   < > 8.2* 8.2* 7.9*  HCT 21.7*   < > 21.6* 23.1*   < > 24.9* 24.8* 24.0*  MCV 91.6   < > 91.5 89.2  --  88.0 88.6 91.3  PLT 181   < > 177 178  --  195 165 159   < > = values in this interval not displayed.   Cardiac Enzymes: No results for input(s): CKTOTAL, CKMB, CKMBINDEX, TROPONINI in the last 168 hours. CBG: Recent Labs  Lab 05/16/19 1601 05/16/19 2140 05/17/19 0742 05/17/19 1127 05/17/19 2055  GLUCAP 126* 150* 123* 129* 135*    Iron Studies:  No results for input(s): IRON, TIBC, TRANSFERRIN, FERRITIN in the last 72 hours. Studies/Results: Dg Chest Port 1 View  Result Date: 05/16/2019 CLINICAL DATA:  Status post dialysis catheter placement EXAM: PORTABLE CHEST 1 VIEW  COMPARISON:  May 15, 2019 FINDINGS: There is a right-sided tunneled dialysis catheter with tip projecting over the distal SVC. The heart size is enlarged. There is no pneumothorax. No large pleural effusion. There is mild volume overload. No focal infiltrate. There is likely a minimal amount of atelectasis at the lung bases. IMPRESSION: 1. Dialysis catheter as above. 2. Cardiomegaly with mild volume overload. Electronically Signed   By: Constance Holster M.D.   On: 05/16/2019 17:02   Dg Fluoro Guide Cv Line Right  Result Date: 05/16/2019 CLINICAL DATA:  End-stage renal disease, catheter placement PROCEDURE: CENTRAL VENOUS CATHETER WITH FLUOROSCOPY FINDINGS: Single intraoperative fluoroscopic spot image documents tunneled right IJ hemodialysis catheter to the mid right atrium. Motion degrades pulmonary parenchymal evaluation. FLUOROSCOPY TIME:  See procedure report IMPRESSION: Right IJ tunneled hemodialysis catheter to the mid right atrium. Electronically Signed   By: Lucrezia Europe M.D.   On: 05/16/2019 14:12    Medications: Infusions: . sodium chloride 10 mL/hr at 05/16/19 1953  . ferric gluconate (FERRLECIT/NULECIT) IV 125 mg (05/18/19 1015)    Scheduled Medications: . atorvastatin  20 mg Oral q1800  . calcitRIOL  0.25 mcg Oral Q M,W,F  . calcium acetate  667 mg Oral TID WC  . carvedilol  3.125 mg Oral BID WC  . Chlorhexidine Gluconate Cloth  6 each Topical Q0600  . Chlorhexidine Gluconate Cloth  6 each Topical Q0600  . Chlorhexidine Gluconate Cloth  6 each Topical Q0600  . guaiFENesin  600 mg Oral BID  . insulin aspart  0-9 Units Subcutaneous TID WC  . pantoprazole  40 mg Oral BID  . sodium chloride flush  3 mL Intravenous Q12H    have reviewed scheduled and prn medications.  Physical Exam: General: Lying on bed comfortable, not in distress Heart:RRR, s1s2 nl, no rubs Lungs: Clear b/l, no wheezing Abdomen:soft, Non-tender, non-distended Extremities: No LE edema Neurology: Alert,  awake, following commands. Access: Right IJ TDC and left AV fistula has a good thrill and bruit.  Jusitn Salsgiver Prasad Alajiah Dutkiewicz 05/18/2019,11:18 AM  LOS: 6 days  Pager: [phone]

## 2019-05-18 NOTE — Discharge Summary (Signed)
Physician Discharge Summary  Louis Bradley JHE:[id number] DOB: [birth year] DOA: 05/12/2019  PCP: Colon Branch, MD  Admit date: 05/12/2019 Discharge date: 05/18/2019  Admitted From: Home Disposition:  Home  Discharge Condition:Stable CODE STATUS:FULL Diet recommendation: Heart Healthy   Brief/Interim Summary:  Patient is a 73 year old male with history of HTN, CKD-4, OSA on CPAP, recent hospitalization from 6/25-6/26 for GIB/NSTEMI presenting with generalized weakness, shortness of breath, weight cough and melena. Reportedly declined EGD and LHC at his recent hospitalization for GIB and NSTEMI and went home. In ED, he was found to have low BP up to  88/49 , Hgb 6.9 . BUN 134. Creatinine 5.6. . BNP 1225 (about the same on 6/25). Lactic acid 0.8. Urinalysis suggestive for UTI. CXR concerning for multifocal airspace opacities concerning for pneumonia. FOBT positive. Patient was started on vancomycin and cefepime empirically for pneumonia and UTI.   Cardiology, GI, nephrology consulted. Underwent upper GI endoscopy without any clear finding of active bleeding.  Started on Protonix.  Nephrology started on dialysis and he has underwent  dialysis catheter placement and AV fistula.  Cardiology did not recommend acute intervention for now.He has finished antibiotics course, cultures remain negative.  Outpatient dialysis has been set up.  He is hemodynamically stable for discharge to home today.  Following problems were addressed during his hospitalization:   Acute respiratory failure: Multifactorial including acute on chronic CHF, non-STEMI,pleural effusion,anemia and possible pneumonia. Chest x-ray showed possibe CHF versus pneumonia.  Respiratory status stable currently. Saturating fine on  room air today.  Acute on chronic combined CHF/moderate pulmonary hypertension: Presented with dspnea.  CXR concerning for CHF.CT abdomen reveals moderate to large pleural effusion, right greater than  left and other findings suggestive for CHF.  Currently respiratory status stable.  Lasix discontinued.  He will follow-up with cardiology as an outpatient.  Moderate to large bilateral pleural effusion, right greater than left:Likely due to fluid overload from CHF and renal failure versus infectious process.  Initiated  dialysis with improvement in the respiratory function.  Symptomatic anemia due to GI bleed/iron deficiency anemia: He had melanotic stool on admission.Iron saturation 9% -Had EGD on 6/28 that revealed gastritis and duodenitis but no active bleed.  Currently hemoglobin stable.Continue PPI.S/p iron infusion  Suspected Pneumonia:  CXR on 05/12/19 showed bibasilar airspace consolidation, possible multifocal pneumonia  Cultures negative so far. COVID-19 screen negative. Procalcitonin marginal.CT abdomen reveals RML opacity which could also be due to fluid overload. Sores have been negative.  He has completed 5-day course of antibiotics treatment.  Antibiotics will be stopped now.  Non-STEMI: Troponin elevated. EKG with some T wave inversions which seems to be old. No anticoagulation due to GI bleed Carvedilol resumed at a lower dose.Continue statin. Cardiology consulted.  No plan for Invasive procedure  for now.  He will follow-up with cardiology as an outpatient.  CKD-5/azotemia:Nephrology following, Underwent  temporary dialysis catheter placement and AV graft placement by vascular surgery. He was  dialyzed today. Outpatient dialysis set up.  Severe chronic left hydronephrosis with cortical thinning of left kidney:  -Urology, Dr. Rush Landmark consulted on 6/30.No plan for any intervention  Mechanical fall at home Right shoulder pain -Patient has some pain in right shoulder with limited range of motion -Shoulder x-ray without fracture dislocation. -Orthopedics consulted on 6/30 and recommended NSAIDs, gentle motion exercises and follow-up with Dr. Sharol Given in 2  weeks.  Well-controlled NIDDM-2 with renal complication.  -On glipizide at home.  OSA on CPAP:  -nightly CPAP  Recent right  toes amputation:Stable -Stitch removed on 6/29 here -Patient evaluated physical therapy and recommended home health on discharge.   Discharge Diagnoses:  Principal Problem:   Sepsis ([place]) Active Problems:   Diabetes mellitus type 2 with complications (HCC)   GI bleed   Acute blood loss anemia   Hypotension   Pneumonia   Acute respiratory failure (HCC)   Acute lower UTI   Atrial fibrillation (HCC)   Volume overload   Pulmonary hypertension [facility])    Discharge Instructions  Discharge Instructions    Diet - low sodium heart healthy   Complete by: As directed    Discharge instructions   Complete by: As directed    1)Please follow up at [facility] for dialysis on Tuesday.  You have been scheduled for dialysis on Tuesday, Thursday and Saturday. 2)Follow up with your primary care physician in a week. 3)Follow up with orthopedics,vascular surgery  and gastroenterology as an outpatient.  Name and number the providers has been attached. 4)Take prescribed medications as instructed 5) You will be called by cardiology for follow-up as an outpatient.   Increase activity slowly   Complete by: As directed      Allergies as of 05/18/2019   No Known Allergies     Medication List    STOP taking these medications   amLODipine 10 MG tablet Commonly known as: NORVASC   furosemide 40 MG tablet Commonly known as: LASIX   sodium bicarbonate 650 MG tablet   sodium zirconium cyclosilicate 10 g Pack packet Commonly known as: Lokelma     TAKE these medications   calcitRIOL 0.25 MCG capsule Commonly known as: ROCALTROL Take 0.25 mcg by mouth every Monday, Wednesday, and Friday.   calcium acetate 667 MG capsule Commonly known as: PHOSLO Take 1 capsule (667 mg total) by mouth 3 (three) times daily with meals.   carvedilol 3.125 MG  tablet Commonly known as: COREG Take 1 tablet (3.125 mg total) by mouth 2 (two) times daily with a meal. What changed:   medication strength  how much to take   glipiZIDE 5 MG tablet Commonly known as: GLUCOTROL Take 2 tablets (10 mg total) by mouth 2 (two) times daily.   nicotine polacrilex 4 MG lozenge Commonly known as: COMMIT Take 4 mg by mouth as needed for smoking cessation.   pantoprazole 40 MG tablet Commonly known as: PROTONIX Take 1 tablet (40 mg total) by mouth 2 (two) times daily.   pravastatin 40 MG tablet Commonly known as: PRAVACHOL Take 1 tablet (40 mg total) by mouth at bedtime.   PROBIOTIC DAILY PO Take 1 tablet by mouth every Monday, Wednesday, and Friday.      Follow-up Information    Buccini, Robert, MD. Schedule an appointment as soon as possible for a visit in 6 week(s).   Specialty: Gastroenterology Why: Anemia Contact information: [address] [place] [address] [phone]        Newt Minion, MD. Schedule an appointment as soon as possible for a visit in 2 week(s).   Specialty: Orthopedic Surgery Contact information: [address] [place] [address] [phone]        [facility] In 6 weeks.   Specialty: Vascular Surgery Why: Office will call you to arrange your appt (sent) Contact information: [address] [facility] Follow up.   Why: [place]- to be delivered to room prior to transition home.  Contact information: [place] [address] [phone]        [facility], [facility] Follow up.   Specialty: Home Health Services Why: Physical and Occupational Therapy-office to call you with a visit time. If office has not called within 24-48 hours of transition home, please feel free to call them.  Contact information: [place]  [address] [phone]          No Known Allergies  Consultations:  Cardiology, GI, nephrology, orthopedics, vascular surgery   Procedures/Studies: Ct Abdomen Pelvis Wo Contrast  Result Date: 05/14/2019 CLINICAL DATA:  Hydronephrosis EXAM: CT ABDOMEN AND PELVIS WITHOUT CONTRAST TECHNIQUE: Multidetector CT imaging of the abdomen and pelvis was performed following the standard protocol without IV contrast. COMPARISON:  Renal ultrasound from the same day FINDINGS: Lower chest: There are moderate to large bilateral pleural effusions, right greater than left. There is interlobular septal thickening bilaterally. There is a focal airspace opacity within the right middle lobe. There are distal scattered airspace opacities throughout the remaining lower lung fields.The heart size is normal. Mitral valve calcifications are noted. Coronary artery calcifications are noted. The intracardiac blood pool is hypodense relative to the adjacent myocardium consistent with anemia. Hepatobiliary: The liver is normal. Normal gallbladder.There is no biliary ductal dilation. Pancreas: Normal contours without ductal dilatation. No peripancreatic fluid collection. Spleen: The spleen is borderline enlarged. Adrenals/Urinary Tract: --Adrenal glands: No adrenal hemorrhage. --Right kidney/ureter: There is no right-sided hydronephrosis. There are multiple right-sided cysts, some which are hyperdense. --Left kidney/ureter: There is severe left-sided hydronephrosis that appears longstanding. There is extensive cortical thinning. This is likely secondary to a chronic left UPJ obstruction. --Urinary bladder: Unremarkable. Stomach/Bowel: --Stomach/Duodenum: No hiatal hernia or other gastric abnormality. Normal duodenal course and caliber. --Small bowel: No dilatation or inflammation. --Colon: No focal abnormality. --Appendix: Normal. Vascular/Lymphatic: Atherosclerotic calcification is present within the non-aneurysmal abdominal aorta,  without hemodynamically significant stenosis. --No retroperitoneal lymphadenopathy. --No mesenteric lymphadenopathy. --No pelvic or inguinal lymphadenopathy. Reproductive: Unremarkable Other: No ascites or free air. There is a left lower quadrant abdominal wall hernia containing multiple loops of small bowel without evidence of an obstruction. Musculoskeletal. No acute displaced fractures. IMPRESSION: 1. Severe chronic left-sided hydronephrosis, likely secondary to a chronic left UPJ obstruction. 2. No right-sided hydronephrosis. 3. Moderate to large bilateral pleural effusions, right greater than left. 4. Interlobular septal thickening involving the bilateral lower lung fields is consistent with volume overload. There is scattered bilateral airspace opacities which may be secondary to pulmonary edema, however an infectious process is not excluded, especially within the right middle lobe. There is compressive atelectasis at the lung bases. 5. Left lower quadrant abdominal wall hernia containing loops of small bowel without evidence of an obstruction. Aortic Atherosclerosis (ICD10-I70.0). Electronically Signed   By: Constance Holster M.D.   On: 05/14/2019 21:01   Dg Chest 1 View  Result Date: 05/15/2019 CLINICAL DATA:  S/p right thoracentesis yielding 800 mL clear yellow fluid. EXAM: CHEST  1 VIEW COMPARISON:  05/13/2019 FINDINGS: Status post right thoracentesis. Bilateral mild interstitial thickening. Improved right perihilar airspace disease. No significant pleural effusion. No pneumothorax. Stable cardiomegaly. No acute osseous abnormality. IMPRESSION: 1. No right pneumothorax status post thoracentesis. 2. Cardiomegaly with mild pulmonary vascular congestion. Electronically Signed   By: Kathreen Devoid   On: 05/15/2019 14:23   Dg Chest 2 View  Result Date: 05/13/2019 CLINICAL DATA:  Hypoxemia EXAM: CHEST - 2 VIEW COMPARISON:  05/12/2019 FINDINGS: There  is a dense, masslike airspace opacity of the perihilar  right lung which is more apparent than on prior examinations, with probable small layering pleural effusions. There is underlying diffuse interstitial pulmonary opacity, likely edema. Cardiomegaly. IMPRESSION: There is a dense, masslike airspace opacity of the perihilar right lung which is more apparent than on prior examinations, with probable small layering pleural effusions. There is underlying diffuse interstitial pulmonary opacity, likely edema. Cardiomegaly. Electronically Signed   By: Eddie Candle M.D.   On: 05/13/2019 13:06   Dg Shoulder Right  Result Date: 05/14/2019 CLINICAL DATA:  Shoulder pain.  Fall.  Arthritis. EXAM: RIGHT SHOULDER - 2+ VIEW COMPARISON:  None. FINDINGS: There is no acute displaced fracture or dislocation. There is mild-to-moderate osteoarthritis involving the right glenohumeral and right AC joints. IMPRESSION: 1. No acute osseous abnormality. 2. Mild-to-moderate osteoarthritis of the right glenohumeral and right AC joints. Electronically Signed   By: Constance Holster M.D.   On: 05/14/2019 19:29   US Renal  Result Date: 05/14/2019 CLINICAL DATA:  Acute kidney injury, history hypertension, diabetes mellitus, former smoker, stage 5 chronic kidney disease EXAM: RENAL / URINARY TRACT ULTRASOUND COMPLETE COMPARISON:  None FINDINGS: Right Kidney: Renal measurements: 12.9 x 6.6 x 7.8 cm = volume: 346 mL. Cortical thinning. Increased cortical echogenicity. No hydronephrosis or shadowing calcification. Three cysts present. Small cyst at mid kidney 2.5 x 2.0 x 2.3 cm. Cyst at inferior pole 4.6 x 6.0 x 3.6 cm. Tiny upper pole renal cyst 1.5 x 1.3 x 2.2 cm. Left Kidney: Renal measurements: 13.9 x 7.06.60 cm = volume: 308 mL. Cortical thinning. Increased cortical echogenicity. Multiple peripelvic cysts versus moderate hydronephrosis. Question separate cyst at upper pole 4.1 x 5.1 x 4.5 cm. Bladder: Decompressed, contains minimal urine Dependent shadowing calculus within gallbladder 11 mm  diameter. IMPRESSION: Cortical atrophy and medical renal disease changes of both kidneys. Three RIGHT renal cysts as above. Multiple peripelvic cysts versus moderate hydronephrosis; noncontrast CT abdomen recommended to differentiate between cystic disease and hydronephrosis. Electronically Signed   By: Lavonia Dana M.D.   On: 05/14/2019 14:40   Dg Chest Port 1 View  Result Date: 05/16/2019 CLINICAL DATA:  Status post dialysis catheter placement EXAM: PORTABLE CHEST 1 VIEW COMPARISON:  May 15, 2019 FINDINGS: There is a right-sided tunneled dialysis catheter with tip projecting over the distal SVC. The heart size is enlarged. There is no pneumothorax. No large pleural effusion. There is mild volume overload. No focal infiltrate. There is likely a minimal amount of atelectasis at the lung bases. IMPRESSION: 1. Dialysis catheter as above. 2. Cardiomegaly with mild volume overload. Electronically Signed   By: Constance Holster M.D.   On: 05/16/2019 17:02   Dg Chest Port 1 View  Result Date: 05/12/2019 CLINICAL DATA:  Cough EXAM: PORTABLE CHEST 1 VIEW COMPARISON:  May 10, 2019 FINDINGS: There is cardiomegaly with pulmonary vascularity within normal limits. There are small pleural effusions bilaterally. There is consolidation in each lung base. Lungs elsewhere clear. No adenopathy. No bone lesions. IMPRESSION: Bibasilar airspace consolidation, most likely representing multifocal pneumonia. Small pleural effusions bilaterally. No evident interstitial edema. There is cardiomegaly. Electronically Signed   By: Lowella Grip III M.D.   On: 05/12/2019 16:12   Dg Chest Portable 1 View  Result Date: 05/10/2019 CLINICAL DATA:  Dizziness. EXAM: PORTABLE CHEST 1 VIEW COMPARISON:  None. FINDINGS: 1919 hours. Low lung volumes. Interstitial markings are diffusely coarsened with chronic features. Bibasilar patchy airspace disease suggests atelectasis or infiltrate. No substantial pleural  effusion. Cardiopericardial  silhouette is at upper limits of normal for size. Bones are diffusely demineralized. Telemetry leads overlie the chest. IMPRESSION: Low lung volumes with bibasilar atelectasis or infiltrate. Electronically Signed   By: Misty Stanley M.D.   On: 05/10/2019 20:09   Dg Fluoro Guide Cv Line Right  Result Date: 05/16/2019 CLINICAL DATA:  End-stage renal disease, catheter placement PROCEDURE: CENTRAL VENOUS CATHETER WITH FLUOROSCOPY FINDINGS: Single intraoperative fluoroscopic spot image documents tunneled right IJ hemodialysis catheter to the mid right atrium. Motion degrades pulmonary parenchymal evaluation. FLUOROSCOPY TIME:  See procedure report IMPRESSION: Right IJ tunneled hemodialysis catheter to the mid right atrium. Electronically Signed   By: Lucrezia Europe M.D.   On: 05/16/2019 14:12   Ir Thoracentesis Asp Pleural Space W/img Guide  Result Date: 05/15/2019 INDICATION: Patient with history of CKD, pulmonary HTN, CHF with complaints of dyspnea requiring supplemental oxygen. Imaging shows bilateral pleural effusions - request to IR for diagnostic and therapeutic thoracentesis. EXAM: ULTRASOUND GUIDED RIGHT THORACENTESIS MEDICATIONS: 7 mL 1% lidocaine COMPLICATIONS: None immediate. PROCEDURE: An ultrasound guided thoracentesis was thoroughly discussed with the patient and questions answered. The benefits, risks, alternatives and complications were also discussed. The patient understands and wishes to proceed with the procedure. Written consent was obtained. Ultrasound was performed to localize and mark an adequate pocket of fluid in the right chest. The area was then prepped and draped in the normal sterile fashion. 1% Lidocaine was used for local anesthesia. Under ultrasound guidance a 6 Fr Safe-T-Centesis catheter was introduced. Thoracentesis was performed. The catheter was removed and a dressing applied. FINDINGS: A total of approximately 800 mL of clear yellow fluid was removed. Samples were sent to the  laboratory as requested by the clinical team. IMPRESSION: Successful ultrasound guided right thoracentesis yielding 800 mL of pleural fluid. Read by Candiss Norse, PA-C Electronically Signed   By: Corrie Mckusick D.O.   On: 05/15/2019 13:10      Subjective: Patient seen and examined the bedside this morning.  He was on dialysis session.  He looks comfortable.  Hemodynamically stable.  Denies any complaints  Discharge Exam: Vitals:   05/18/19 1000 05/18/19 1027  BP: (!) 118/55 (!) 109/54  Pulse: (!) 59 61  Resp: 19 17  Temp:  98.4 F (36.9 C)  SpO2:  100%   Vitals:   05/18/19 0900 05/18/19 0930 05/18/19 1000 05/18/19 1027  BP: (!) 126/42 (!) 123/44 (!) 118/55 (!) 109/54  Pulse: 68 (!) 59 (!) 59 61  Resp: 14 19 19 17   Temp:    98.4 F (36.9 C)  TempSrc:    Oral  SpO2:    100%  Weight:    122.2 kg  Height:        General: Pt is alert, awake, not in acute distress Cardiovascular: RRR, S1/S2 +, no rubs, no gallops, dialysis cath on the right chest Respiratory: CTA bilaterally, no wheezing, no rhonchi Abdominal: Soft, NT, ND, bowel sounds + Extremities: no edema, no cyanosis, AV fistula on the left arm    The results of significant diagnostics from this hospitalization (including imaging, microbiology, ancillary and laboratory) are listed below for reference.     Microbiology: Recent Results (from the past 240 hour(s))  Novel Coronavirus,NAA,(SEND-OUT TO REF LAB - TAT 24-48 hrs); Hosp Order     Status: None   Collection Time: 05/10/19  7:39 PM   Specimen: Nasopharyngeal Swab; Respiratory  Result Value Ref Range Status   SARS-CoV-2, NAA NOT DETECTED NOT DETECTED Final  Comment: (NOTE) This test was developed and its performance characteristics determined by [employer]. This test has not been FDA cleared or approved. This test has been authorized by FDA under an Emergency Use Authorization (EUA). This test is only authorized for the duration of time the  declaration that circumstances exist justifying the authorization of the emergency use of in vitro diagnostic tests for detection of SARS-CoV-2 virus and/or diagnosis of COVID-19 infection under section 564(b)(1) of the Act, 21 U.S.C. 626RSW-5(I)(6), unless the authorization is terminated or revoked sooner. When diagnostic testing is negative, the possibility of a false negative result should be considered in the context of a patient's recent exposures and the presence of clinical signs and symptoms consistent with COVID-19. An individual without symptoms of COVID-19 and who is not shedding SARS-CoV-2 virus would expect to have a negative (not detected) result in this assay. Performed  At: [facility] [address] [place], [place] [id number] Rush Farmer MD GH:[id number]    [place]  Final    Comment: Performed at [facility], [place] [address] [place], [place] [address]  SARS Coronavirus 2 (CEPHEID- Performed in [place] hospital lab), Hosp Order     Status: None   Collection Time: 05/12/19  2:34 PM   Specimen: Nasopharyngeal Swab  Result Value Ref Range Status   SARS Coronavirus 2 NEGATIVE NEGATIVE Final    Comment: (NOTE) If result is NEGATIVE SARS-CoV-2 target nucleic acids are NOT DETECTED. The SARS-CoV-2 RNA is generally detectable in upper and lower  respiratory specimens during the acute phase of infection. The lowest  concentration of SARS-CoV-2 viral copies this assay can detect is 250  copies / mL. A negative result does not preclude SARS-CoV-2 infection  and should not be used as the sole basis for treatment or other  patient management decisions.  A negative result may occur with  improper specimen collection / handling, submission of specimen other  than nasopharyngeal swab, presence of viral mutation(s) within the  areas targeted by this assay, and inadequate number of viral copies  (<250 copies / mL). A negative result  must be combined with clinical  observations, patient history, and epidemiological information. If result is POSITIVE SARS-CoV-2 target nucleic acids are DETECTED. The SARS-CoV-2 RNA is generally detectable in upper and lower  respiratory specimens dur ing the acute phase of infection.  Positive  results are indicative of active infection with SARS-CoV-2.  Clinical  correlation with patient history and other diagnostic information is  necessary to determine patient infection status.  Positive results do  not rule out bacterial infection or co-infection with other viruses. If result is PRESUMPTIVE POSTIVE SARS-CoV-2 nucleic acids MAY BE PRESENT.   A presumptive positive result was obtained on the submitted specimen  and confirmed on repeat testing.  While 2019 novel coronavirus  (SARS-CoV-2) nucleic acids may be present in the submitted sample  additional confirmatory testing may be necessary for epidemiological  and / or clinical management purposes  to differentiate between  SARS-CoV-2 and other Sarbecovirus currently known to infect humans.  If clinically indicated additional testing with an alternate test  methodology [phone]) is advised. The SARS-CoV-2 RNA is generally  detectable in upper and lower respiratory sp ecimens during the acute  phase of infection. The expected result is Negative. Fact Sheet for Patients:  StrictlyIdeas.no Fact Sheet for Healthcare Providers: BankingDealers.co.za This test is not yet approved or cleared by the [place] FDA and has been authorized for detection and/or diagnosis  of SARS-CoV-2 by FDA under an Emergency Use Authorization (EUA).  This EUA will remain in effect (meaning this test can be used) for the duration of the COVID-19 declaration under Section 564(b)(1) of the Act, 21 U.S.C. section 360bbb-3(b)(1), unless the authorization is terminated or revoked sooner. Performed at [facility], [place] [address] [place], [place] [address]   Blood Culture (routine x 2)     Status: None   Collection Time: 05/12/19  2:45 PM   Specimen: BLOOD  Result Value Ref Range Status   Specimen Description BLOOD RIGHT ANTECUBITAL  Final   Special Requests   Final    BOTTLES DRAWN AEROBIC AND ANAEROBIC Blood Culture results may not be optimal due to an excessive volume of blood received in culture bottles   Culture   Final    NO GROWTH 5 DAYS Performed at [facility], [place] [address] [place], [place] [address]    Report Status 05/17/2019 FINAL  Final  Blood Culture (routine x 2)     Status: None   Collection Time: 05/12/19  3:09 PM   Specimen: BLOOD  Result Value Ref Range Status   Specimen Description BLOOD LEFT ANTECUBITAL  Final   Special Requests   Final    BOTTLES DRAWN AEROBIC AND ANAEROBIC Blood Culture adequate volume   Culture   Final    NO GROWTH 5 DAYS Performed at [facility], [place] [address], [place], [place] [address]    Report Status 05/17/2019 FINAL  Final  Urine Culture     Status: Abnormal   Collection Time: 05/12/19  4:00 PM   Specimen: Urine, Random  Result Value Ref Range Status   Specimen Description URINE, RANDOM  Final   Special Requests   Final    NONE Performed at [facility], [place] [address], [place], [place] [address]    Culture MULTIPLE SPECIES PRESENT, SUGGEST RECOLLECTION (A)  Final   Report Status 05/14/2019 FINAL  Final  Gram stain     Status: None   Collection Time: 05/15/19  1:00 PM   Specimen: Pleural, Right; Pleural Fluid  Result Value Ref Range Status   Specimen Description PLEURAL  Final   Special Requests FLUID  Final   Gram Stain   Final    WBC PRESENT,BOTH PMN AND MONONUCLEAR NO ORGANISMS SEEN CYTOSPIN SMEAR Performed at [facility], [place] [address], [place], [place] [address]    Report Status 05/15/2019 FINAL  Final  Acid Fast Smear (AFB)     Status: None   Collection Time: 05/15/19  1:00 PM    Specimen: Pleural, Right; Pleural Fluid  Result Value Ref Range Status   AFB Specimen Processing Concentration  Final   Acid Fast Smear Negative  Final    Comment: (NOTE) Performed At: [facility] [place], [place] [id number] Rush Farmer MD [id number]    Source (AFB) FLUID  Final    Comment: Performed at [facility], [place] [address], [place], [place] [address]  Culture, body fluid-bottle     Status: None (Preliminary result)   Collection Time: 05/15/19  1:02 PM   Specimen: Pleura  Result Value Ref Range Status   Specimen Description PLEURAL  Final   Special Requests FLUID  Final   Culture   Final    NO GROWTH 3 DAYS Performed at [place] [address], [place],  [address]    Report Status PENDING  Incomplete  Surgical pcr screen     Status: None   Collection Time: 05/16/19 10:31 AM   Specimen: Nasal Mucosa; Nasal Swab  Result Value Ref Range Status   MRSA, PCR NEGATIVE NEGATIVE Final   Staphylococcus aureus NEGATIVE NEGATIVE Final    Comment: (NOTE) The Xpert SA Assay (FDA approved for NASAL specimens in patients 44 years of age and older), is one component of a comprehensive surveillance program. It is not intended to diagnose infection nor to guide or monitor treatment. Performed at [facility], [place] [address], [place],  [address]      Labs: BNP (last 3 results) Recent Labs    05/10/19 1924 05/12/19 1832  BNP 1,144.8* 6,948.5*   Basic Metabolic Panel: Recent Labs  Lab 05/13/19 1153 05/14/19 0643 05/15/19 0513 05/16/19 0500 05/17/19 0351 05/18/19 0500  NA 136 139 138 140 140  --   K 4.7 4.4 4.8 4.3 3.7  --   CL 106 107 106 104 106  --   CO2 19* 19* 21* 21* 24  --   GLUCOSE 140* 89 117* 118* 115*  --   BUN 139* 146* 146* 138* 104*  --   CREATININE 5.64* 5.54* 5.38* 5.03* 3.80*  --   CALCIUM 7.7* 7.6* 8.1*  7.9* 8.6* 8.1*  --   MG 3.2* 3.0* 2.9* 3.3* 2.2 2.1  PHOS 6.0* 6.9* 6.4* 5.5*  4.3  --    Liver Function Tests: Recent Labs  Lab 05/12/19 1434 05/13/19 1153 05/14/19 0643 05/15/19 0513 05/16/19 0500 05/17/19 0351  AST 13*  --   --   --   --   --   ALT 15  --   --   --   --   --   ALKPHOS 50  --   --   --   --   --   BILITOT 0.4  --   --   --   --   --   PROT 5.8*  --   --   --   --   --   ALBUMIN 3.0* 2.8* 2.6* 2.7* 2.8* 2.7*   No results for input(s): LIPASE, AMYLASE in the last 168 hours. No results for input(s): AMMONIA in the last 168 hours. CBC: Recent Labs  Lab 05/12/19 1434  05/14/19 0643 05/15/19 0513 05/15/19 1434 05/16/19 0500 05/17/19 0351 05/18/19 0500  WBC 12.5*   < > 9.6 9.2  --  8.6 7.6 9.4  NEUTROABS 11.3*  --  7.8*  --   --   --   --   --   HGB 6.9*   < > 7.0* 7.7* 8.6* 8.2* 8.2* 7.9*  HCT 21.7*   < > 21.6* 23.1* 25.9* 24.9* 24.8* 24.0*  MCV 91.6   < > 91.5 89.2  --  88.0 88.6 91.3  PLT 181   < > 177 178  --  195 165 159   < > = values in this interval not displayed.   Cardiac Enzymes: No results for input(s): CKTOTAL, CKMB, CKMBINDEX, TROPONINI in the last 168 hours. BNP: Invalid input(s): POCBNP CBG: Recent Labs  Lab 05/16/19 1601 05/16/19 2140 05/17/19 0742 05/17/19 1127 05/17/19 2055  GLUCAP 126* 150* 123* 129* 135*   D-Dimer No results for input(s): DDIMER in the last 72 hours. Hgb A1c No results for input(s): HGBA1C in the last 72 hours. Lipid Profile No results for input(s): CHOL, HDL, LDLCALC, TRIG, CHOLHDL, LDLDIRECT in the last 72 hours. Thyroid function studies  No results for input(s): TSH, T4TOTAL, T3FREE, THYROIDAB in the last 72 hours.  Invalid input(s): FREET3 Anemia work up No results for input(s): VITAMINB12, FOLATE, FERRITIN, TIBC, IRON, RETICCTPCT in the last 72 hours. Urinalysis    Component Value Date/Time   COLORURINE YELLOW 05/12/2019 1600   APPEARANCEUR HAZY (A) 05/12/2019 1600   LABSPEC 1.013 05/12/2019 1600   PHURINE 5.0 05/12/2019 1600   GLUCOSEU 50 (A) 05/12/2019 1600   HGBUR  SMALL (A) 05/12/2019 1600   BILIRUBINUR NEGATIVE 05/12/2019 1600   KETONESUR NEGATIVE 05/12/2019 1600   PROTEINUR 100 (A) 05/12/2019 1600   NITRITE NEGATIVE 05/12/2019 1600   LEUKOCYTESUR LARGE (A) 05/12/2019 1600   Sepsis Labs Invalid input(s): PROCALCITONIN,  WBC,  LACTICIDVEN Microbiology Recent Results (from the past 240 hour(s))  Novel Coronavirus,NAA,(SEND-OUT TO REF LAB - TAT 24-48 hrs); Hosp Order     Status: None   Collection Time: 05/10/19  7:39 PM   Specimen: Nasopharyngeal Swab; Respiratory  Result Value Ref Range Status   SARS-CoV-2, NAA NOT DETECTED NOT DETECTED Final    Comment: (NOTE) This test was developed and its performance characteristics determined by [employer]. This test has not been FDA cleared or approved. This test has been authorized by FDA under an Emergency Use Authorization (EUA). This test is only authorized for the duration of time the declaration that circumstances exist justifying the authorization of the emergency use of in vitro diagnostic tests for detection of SARS-CoV-2 virus and/or diagnosis of COVID-19 infection under section 564(b)(1) of the Act, 21 U.S.C. 573UKG-2(R)(4), unless the authorization is terminated or revoked sooner. When diagnostic testing is negative, the possibility of a false negative result should be considered in the context of a patient's recent exposures and the presence of clinical signs and symptoms consistent with COVID-19. An individual without symptoms of COVID-19 and who is not shedding SARS-CoV-2 virus would expect to have a negative (not detected) result in this assay. Performed  At: [facility] [address] [place], [place] [id number] Rush Farmer MD GB:[id number]    [place]  Final    Comment: Performed at [facility], [place] [address] [place], [place] [address]  SARS Coronavirus 2 (CEPHEID- Performed in [place] hospital lab), Hosp Order     Status:  None   Collection Time: 05/12/19  2:34 PM   Specimen: Nasopharyngeal Swab  Result Value Ref Range Status   SARS Coronavirus 2 NEGATIVE NEGATIVE Final    Comment: (NOTE) If result is NEGATIVE SARS-CoV-2 target nucleic acids are NOT DETECTED. The SARS-CoV-2 RNA is generally detectable in upper and lower  respiratory specimens during the acute phase of infection. The lowest  concentration of SARS-CoV-2 viral copies this assay can detect is 250  copies / mL. A negative result does not preclude SARS-CoV-2 infection  and should not be used as the sole basis for treatment or other  patient management decisions.  A negative result may occur with  improper specimen collection / handling, submission of specimen other  than nasopharyngeal swab, presence of viral mutation(s) within the  areas targeted by this assay, and inadequate number of viral copies  (<250 copies / mL). A negative result must be combined with clinical  observations, patient history, and epidemiological information. If result is POSITIVE SARS-CoV-2 target nucleic acids are DETECTED. The SARS-CoV-2 RNA is generally detectable in upper and lower  respiratory specimens dur ing the acute phase of infection.  Positive  results are indicative of active infection with SARS-CoV-2.  Clinical  correlation with patient history and other diagnostic information is  necessary to determine patient infection status.  Positive results do  not rule out bacterial infection or co-infection with other viruses. If result is PRESUMPTIVE POSTIVE SARS-CoV-2 nucleic acids MAY BE PRESENT.   A presumptive positive result was obtained on the submitted specimen  and confirmed on repeat testing.  While 2019 novel coronavirus  (SARS-CoV-2) nucleic acids may be present in the submitted sample  additional confirmatory testing may be necessary for epidemiological  and / or clinical management purposes  to differentiate between  SARS-CoV-2 and other  Sarbecovirus currently known to infect humans.  If clinically indicated additional testing with an alternate test  methodology [phone]) is advised. The SARS-CoV-2 RNA is generally  detectable in upper and lower respiratory sp ecimens during the acute  phase of infection. The expected result is Negative. Fact Sheet for Patients:  StrictlyIdeas.no Fact Sheet for Healthcare Providers: BankingDealers.co.za This test is not yet approved or cleared by the [place] FDA and has been authorized for detection and/or diagnosis of SARS-CoV-2 by FDA under an Emergency Use Authorization (EUA).  This EUA will remain in effect (meaning this test can be used) for the duration of the COVID-19 declaration under Section 564(b)(1) of the Act, 21 U.S.C. section 360bbb-3(b)(1), unless the authorization is terminated or revoked sooner. Performed at [facility], [place] [address], [place], [place] [address]   Blood Culture (routine x 2)     Status: None   Collection Time: 05/12/19  2:45 PM   Specimen: BLOOD  Result Value Ref Range Status   Specimen Description BLOOD RIGHT ANTECUBITAL  Final   Special Requests   Final    BOTTLES DRAWN AEROBIC AND ANAEROBIC Blood Culture results may not be optimal due to an excessive volume of blood received in culture bottles   Culture   Final    NO GROWTH 5 DAYS Performed at [facility], [place] [address], [place], [place] [address]    Report Status 05/17/2019 FINAL  Final  Blood Culture (routine x 2)     Status: None   Collection Time: 05/12/19  3:09 PM   Specimen: BLOOD  Result Value Ref Range Status   Specimen Description BLOOD LEFT ANTECUBITAL  Final   Special Requests   Final    BOTTLES DRAWN AEROBIC AND ANAEROBIC Blood Culture adequate volume   Culture   Final    NO GROWTH 5 DAYS Performed at [facility], [place] [address], [place], [place] [address]    Report Status 05/17/2019 FINAL  Final   Urine Culture     Status: Abnormal   Collection Time: 05/12/19  4:00 PM   Specimen: Urine, Random  Result Value Ref Range Status   Specimen Description URINE, RANDOM  Final   Special Requests   Final    NONE Performed at [facility], [place] [address] [place], [place] [address]    Culture MULTIPLE SPECIES PRESENT, SUGGEST RECOLLECTION (A)  Final   Report Status 05/14/2019 FINAL  Final  Gram stain     Status: None   Collection Time: 05/15/19  1:00 PM   Specimen: Pleural, Right; Pleural Fluid  Result Value Ref Range Status   Specimen Description PLEURAL  Final   Special Requests FLUID  Final   Gram Stain   Final    WBC PRESENT,BOTH PMN AND MONONUCLEAR NO ORGANISMS SEEN CYTOSPIN SMEAR Performed at [facility], [place] [address]  [place], [place] [address]    Report Status 05/15/2019 FINAL  Final  Acid Fast Smear (AFB)     Status: None   Collection Time: 05/15/19  1:00 PM   Specimen: Pleural, Right; Pleural Fluid  Result Value Ref Range Status   AFB Specimen Processing Concentration  Final   Acid Fast Smear Negative  Final    Comment: (NOTE) Performed At: [facility] [place], [place] [id number] Rush Farmer MD TW:[id number]    Source (AFB) FLUID  Final    Comment: Performed at [facility], [place] [address], [place], [place] [address]  Culture, body fluid-bottle     Status: None (Preliminary result)   Collection Time: 05/15/19  1:02 PM   Specimen: Pleura  Result Value Ref Range Status   Specimen Description PLEURAL  Final   Special Requests FLUID  Final   Culture   Final    NO GROWTH 3 DAYS Performed at [place] [address] [place], [place] [address]    Report Status PENDING  Incomplete  Surgical pcr screen     Status: None   Collection Time: 05/16/19 10:31 AM   Specimen: Nasal Mucosa; Nasal Swab  Result Value Ref Range Status   MRSA, PCR NEGATIVE NEGATIVE Final   Staphylococcus aureus NEGATIVE NEGATIVE Final     Comment: (NOTE) The Xpert SA Assay (FDA approved for NASAL specimens in patients 73 years of age and older), is one component of a comprehensive surveillance program. It is not intended to diagnose infection nor to guide or monitor treatment. Performed at [facility], [place] [address] [place], [place] [address]     Please note: You were cared for by a hospitalist during your hospital stay. Once you are discharged, your primary care physician will handle any further medical issues. Please note that NO REFILLS for any discharge medications will be authorized once you are discharged, as it is imperative that you return to your primary care physician (or establish a relationship with a primary care physician if you do not have one) for your post hospital discharge needs so that they can reassess your need for medications and monitor your lab values.    Time coordinating discharge: 40 minutes  SIGNED:   Shelly Coss, MD  [facility] 05/18/2019, 11:21 AM Pager [phone]  If 7PM-7AM, please contact night-coverage [email or web address] Password [email or web address]

## 2019-05-18 NOTE — TOC Transition Note (Signed)
Transition of Care [facility]) - CM/SW Discharge Note   Patient Details  Name: UCHENNA RAPPAPORT MRN: [id number] Date of Birth: 22-Dec-1945  [facility]) CM/SW Contact:  Pollie Friar, RN Phone Number: 05/18/2019, 6:43 PM   Clinical Narrative:    Pt discharged home with [facility] services. Dorian Pod with [facility] aware of d/c.  Walker not in pts room per bedside RN. CM notified [employer] and they were to deliver. Per bedside RN walker did not arrive but pt decided to go without the walker.   Final next level of care: [place] Barriers to Discharge: No Barriers Identified   Patient Goals and CMS Choice Patient states their goals for this hospitalization and ongoing recovery are:: to get back home with family CMS Medicare.gov Compare Post Acute Care list provided to:: Patient Choice offered to / list presented to : Patient, Spouse  Discharge Placement                       Discharge Plan and Services In-house Referral: NA Discharge Planning Services: CM Consult Post Acute Care Choice: Home Health, Durable Medical Equipment          DME Arranged: Walker rolling DME Agency: [employer] Date DME Agency Contacted: 05/17/19 Time DME Agency Contacted: 67 Representative spoke with at DME Agency: [place]: PT [place]: [facility] [place] Date [place]: 05/18/19 Time [place]: 1105 Representative spoke with at [place]: [place] ([place]) Interventions     Readmission Risk Interventions Readmission Risk Prevention Plan 05/17/2019  Transportation Screening Complete  PCP or Specialist Appt within 3-5 Days Complete  HRI or [place] Complete  Social Work Consult for [place] Planning/Counseling Complete  Palliative Care Screening Not Applicable  Medication Review Press photographer) Complete

## 2019-05-18 NOTE — Progress Notes (Signed)
OT Cancellation Note  Patient Details Name: Louis Bradley MRN: [id number] DOB: Jun 28, 1946   Cancelled Treatment:    Reason Eval/Treat Not Completed: Patient at procedure or test/ unavailable. Pt in HD. Will f/u later today as time allows.   Darrol Jump OTR/L 05/18/2019, 10:06 AM

## 2019-05-18 NOTE — Progress Notes (Signed)
Occupational Therapy Treatment Patient Details Name: Louis Bradley MRN: [id number] DOB: 1946-08-01 Today's Date: 05/18/2019    History of present illness 73 yo admitted with weakness and SOB with sepsis due to PNA and UTI. PT with anemia, CHF, progressive renal failure and AFib. Pt with recent admit 6/25-6/26 with NSTEMI and GIB at which time pt refused EGD and cardiac cath. PMHx: CKD, toe amputations, DM, AS, obesity, OSA, HTN, HLD, anemia   OT comments  Pt preparing for discharge home upon OT arrival. Sitting up in chair and is dressed. Focus of today's session on right UE ROM.  Pt reporting decreased pain and discomfort in right UE. He is demonstrating improved shoulder ROM (180 degrees flexion).  Pt is declining RW recommended by PT, so therapist made RN aware.  Will continue to follow acutely.   Follow Up Recommendations  Home health OT;Supervision/Assistance - 24 hour    Equipment Recommendations  None recommended by OT    Recommendations for Other Services      Precautions / Restrictions Precautions Precautions: Fall       Mobility Bed Mobility    Pt up in chair.              Transfers                      Balance                                           ADL either performed or assessed with clinical judgement   ADL                                         General ADL Comments: Pt dressed and preparing for discharge home. States he did not have difficulty donning clothing. Therapist recommended gentle ROM in right UE but to avoid heavy lifting with right UE for now. Pt declining RW recommended by PT. Therapist discussed the recommendation, but pt states he would prefer to use his scooter at home (and his house is accessible for scooter).      Vision       Perception     Praxis      Cognition Arousal/Alertness: Awake/alert Behavior During Therapy: WFL for tasks assessed/performed Overall Cognitive Status:  Within Functional Limits for tasks assessed                                          Exercises Exercises: General Upper Extremity General Exercises - Upper Extremity Shoulder Flexion: AROM;Right;5 reps;Seated(180 degrees) Shoulder ABduction: AROM;Right;5 reps(110 degrees) Elbow Flexion: AROM;Right;5 reps;Seated Elbow Extension: AROM;Right;5 reps;Seated   Shoulder Instructions       General Comments      Pertinent Vitals/ Pain       Pain Assessment: Faces Faces Pain Scale: Hurts a little bit Pain Location: R shoulder with ROM Pain Descriptors / Indicators: Discomfort Pain Intervention(s): Monitored during session  Home Living                                          Prior Functioning/Environment  Frequency  Min 2X/week        Progress Toward Goals  OT Goals(current goals can now be found in the care plan section)  Progress towards OT goals: Progressing toward goals  Acute Rehab OT Goals Patient Stated Goal: return home to reading and travel OT Goal Formulation: With patient Time For Goal Achievement: 05/29/19 Potential to Achieve Goals: Good ADL Goals Pt Will Perform Grooming: with modified independence;standing Pt Will Perform Lower Body Bathing: with modified independence;sit to/from stand Pt Will Perform Upper Body Dressing: with modified independence;sitting Pt Will Perform Lower Body Dressing: with modified independence;sit to/from stand Pt Will Transfer to Toilet: with modified independence;ambulating Pt Will Perform Toileting - Clothing Manipulation and hygiene: with modified independence;sit to/from stand Pt/caregiver will Perform Home Exercise Program: Increased strength;Increased ROM;Right Upper extremity;Independently;With written HEP provided  Plan Discharge plan remains appropriate    Co-evaluation                 AM-PAC OT "6 Clicks" Daily Activity     Outcome Measure   Help from  another person eating meals?: None Help from another person taking care of personal grooming?: None Help from another person toileting, which includes using toliet, bedpan, or urinal?: None Help from another person bathing (including washing, rinsing, drying)?: A Little Help from another person to put on and taking off regular upper body clothing?: A Little Help from another person to put on and taking off regular lower body clothing?: A Little 6 Click Score: 21    End of Session    OT Visit Diagnosis: Unsteadiness on feet (R26.81);Muscle weakness (generalized) (M62.81)   Activity Tolerance Patient tolerated treatment well   Patient Left in chair;with call bell/phone within reach   Nurse Communication Mobility status(pt declining RW for d/c home)        Time: 1300-1315 OT Time Calculation (min): 15 min  Charges: OT General Charges $OT Visit: 1 Visit OT Treatments $Therapeutic Exercise: 8-22 mins    Darrol Jump OTR/L [place] [phone] 05/18/2019, 1:38 PM

## 2019-05-19 LAB — HEPATITIS B CORE ANTIBODY, TOTAL: Hep B Core Total Ab: NEGATIVE

## 2019-05-19 LAB — HEPATITIS B SURFACE ANTIBODY,QUALITATIVE: Hep B S Ab: NONREACTIVE

## 2019-05-20 LAB — CULTURE, BODY FLUID W GRAM STAIN -BOTTLE: Culture: NO GROWTH

## 2019-05-21 ENCOUNTER — Telehealth: Payer: Self-pay | Admitting: *Deleted

## 2019-05-21 DIAGNOSIS — I214 Non-ST elevation (NSTEMI) myocardial infarction: Secondary | ICD-10-CM | POA: Insufficient documentation

## 2019-05-21 NOTE — Telephone Encounter (Signed)
Transition Care Management Follow-up Telephone Call   Date discharged?05/18/19   How have you been since you were released from the hospital? "doing well"   Do you understand why you were in the hospital? yes   Do you understand the discharge instructions? yes   Where were you discharged to? Home with wife.   Items Reviewed:  Medications reviewed: pt states he will have list at time of appt w/ PCP  Allergies reviewed: yes  Dietary changes reviewed: yes  Referrals reviewed: yes   Functional Questionnaire:   Activities of Daily Living (ADLs):   He states they are independent in the following: ambulation, bathing and hygiene, feeding, continence, grooming, toileting and dressing States they require assistance with the following: na   Any transportation issues/concerns?: no   Any patient concerns? no   Confirmed importance and date/time of follow-up visits scheduled yes  Provider Appointment booked with PCP 05/28/19  Confirmed with patient if condition begins to worsen call PCP or go to the ER.  Patient was given the office number and encouraged to call back with question or concerns.  : yes

## 2019-05-22 ENCOUNTER — Telehealth: Payer: Self-pay | Admitting: *Deleted

## 2019-05-22 ENCOUNTER — Telehealth: Payer: Self-pay | Admitting: Cardiology

## 2019-05-22 NOTE — Telephone Encounter (Signed)
   Patient agrees to the below consent for virtual visit.   IF USING DOXIMITY or DOXY.ME - The patient will receive a link just prior to their visit by text.     FULL LENGTH CONSENT FOR TELE-HEALTH VISIT   I hereby voluntarily request, consent and authorize [facility] and its employed or contracted physicians, physician assistants, nurse practitioners or other licensed health care professionals (the Practitioner), to provide me with telemedicine health care services (the "Services") as deemed necessary by the treating Practitioner. I acknowledge and consent to receive the Services by the Practitioner via telemedicine. I understand that the telemedicine visit will involve communicating with the Practitioner through live audiovisual communication technology and the disclosure of certain medical information by electronic transmission. I acknowledge that I have been given the opportunity to request an in-person assessment or other available alternative prior to the telemedicine visit and am voluntarily participating in the telemedicine visit.  I understand that I have the right to withhold or withdraw my consent to the use of telemedicine in the course of my care at any time, without affecting my right to future care or treatment, and that the Practitioner or I may terminate the telemedicine visit at any time. I understand that I have the right to inspect all information obtained and/or recorded in the course of the telemedicine visit and may receive copies of available information for a reasonable fee.  I understand that some of the potential risks of receiving the Services via telemedicine include:  Marland Kitchen Delay or interruption in medical evaluation due to technological equipment failure or disruption; . Information transmitted may not be sufficient (e.g. poor resolution of images) to allow for appropriate medical decision making by the Practitioner; and/or  . In rare instances, security protocols could  fail, causing a breach of personal health information.  Furthermore, I acknowledge that it is my responsibility to provide information about my medical history, conditions and care that is complete and accurate to the best of my ability. I acknowledge that Practitioner's advice, recommendations, and/or decision may be based on factors not within their control, such as incomplete or inaccurate data provided by me or distortions of diagnostic images or specimens that may result from electronic transmissions. I understand that the practice of medicine is not an exact science and that Practitioner makes no warranties or guarantees regarding treatment outcomes. I acknowledge that I will receive a copy of this consent concurrently upon execution via email to the email address I last provided but may also request a printed copy by calling the office of [facility].    I understand that my insurance will be billed for this visit.   I have read or had this consent read to me. . I understand the contents of this consent, which adequately explains the benefits and risks of the Services being provided via telemedicine.  . I have been provided ample opportunity to ask questions regarding this consent and the Services and have had my questions answered to my satisfaction. . I give my informed consent for the services to be provided through the use of telemedicine in my medical care  By participating in this telemedicine visit I agree to the consent

## 2019-05-22 NOTE — Telephone Encounter (Signed)
New Message     Pt has TOC appt with Kathyrn Drown 05/29/19 at 10:45am

## 2019-05-22 NOTE — Telephone Encounter (Signed)
Spoke with the patient's wife and let her know I would discuss with Dr Marlou Porch about coming into office to discuss her concerns.    Called and spoke with Dr Marlou Porch who has agreed to see the patient next week.  Patient and his wife are agreeable to do a virtual visit on 7/15 at 10am.  I let her know someone would be calling her to go over the virtual process.  She was appreciative of my time and getting the appointment with Dr Marlou Porch.

## 2019-05-22 NOTE — Telephone Encounter (Addendum)
The pts wife states that the pt is at dialysis at this time and offered to s/w me. I advised her that because the pt has not signed a DPR allowing [place] to s/w her and he is not there to give permission over the phone that I cannot discuss his care with her.  She stated that since I was calling from [facility] she wanted [place] to know that they are very unhappy with the care that the pt received from our cardiology practice while in the hospital and that he will be finding another cardiologist soon. She does state that Dr Larose Kells wants the pt to see [facility] but that she and the pt has lost faith in [place].  She states that the Echo dept in the hospital almost used dye on the pt and he absolutely cannot have dye due to his kidneys. She states that they were told that the pts Echo was abnormal and that he needed an emergency cath and was then told it was nothing to worry about but could or would not answer their questions. She reports that she has called and s/w several Physician Assistants at the hospital concerning his heart and no one can answer their questions if they even bother to call back.  I s/w the pts wife for a long time and she is now willing to have the pt seen by [facility] if the pt can see an MD and not a PA or NP for the post hospital follow up. I have advised her that I am going to discuss with a member of management and that we will call them back today with an appointment.  She was much happier at the end of our conversation and thanked me for calling and for listening to her complaints.

## 2019-05-25 ENCOUNTER — Telehealth: Payer: Self-pay | Admitting: Internal Medicine

## 2019-05-25 ENCOUNTER — Encounter (HOSPITAL_COMMUNITY): Payer: Self-pay | Admitting: Emergency Medicine

## 2019-05-25 ENCOUNTER — Inpatient Hospital Stay (HOSPITAL_COMMUNITY)
Admission: EM | Admit: 2019-05-25 | Discharge: 2019-06-01 | DRG: 377 | Disposition: A | Discharge status: Home Health | Payer: Medicare Other | Attending: Internal Medicine | Admitting: Internal Medicine

## 2019-05-25 ENCOUNTER — Other Ambulatory Visit: Payer: Self-pay

## 2019-05-25 ENCOUNTER — Emergency Department (HOSPITAL_COMMUNITY): Payer: Medicare Other

## 2019-05-25 DIAGNOSIS — I959 Hypotension, unspecified: Secondary | ICD-10-CM | POA: Diagnosis present

## 2019-05-25 DIAGNOSIS — E118 Type 2 diabetes mellitus with unspecified complications: Secondary | ICD-10-CM | POA: Diagnosis not present

## 2019-05-25 DIAGNOSIS — I272 Pulmonary hypertension, unspecified: Secondary | ICD-10-CM | POA: Diagnosis present

## 2019-05-25 DIAGNOSIS — I4819 Other persistent atrial fibrillation: Secondary | ICD-10-CM | POA: Diagnosis present

## 2019-05-25 DIAGNOSIS — Z1159 Encounter for screening for other viral diseases: Secondary | ICD-10-CM

## 2019-05-25 DIAGNOSIS — Y9289 Other specified places as the place of occurrence of the external cause: Secondary | ICD-10-CM | POA: Diagnosis not present

## 2019-05-25 DIAGNOSIS — K264 Chronic or unspecified duodenal ulcer with hemorrhage: Secondary | ICD-10-CM | POA: Diagnosis present

## 2019-05-25 DIAGNOSIS — K2970 Gastritis, unspecified, without bleeding: Secondary | ICD-10-CM | POA: Diagnosis present

## 2019-05-25 DIAGNOSIS — I480 Paroxysmal atrial fibrillation: Secondary | ICD-10-CM | POA: Diagnosis not present

## 2019-05-25 DIAGNOSIS — I4891 Unspecified atrial fibrillation: Secondary | ICD-10-CM | POA: Diagnosis present

## 2019-05-25 DIAGNOSIS — I132 Hypertensive heart and chronic kidney disease with heart failure and with stage 5 chronic kidney disease, or end stage renal disease: Secondary | ICD-10-CM | POA: Diagnosis present

## 2019-05-25 DIAGNOSIS — K3189 Other diseases of stomach and duodenum: Secondary | ICD-10-CM | POA: Diagnosis present

## 2019-05-25 DIAGNOSIS — G4733 Obstructive sleep apnea (adult) (pediatric): Secondary | ICD-10-CM | POA: Diagnosis present

## 2019-05-25 DIAGNOSIS — Z7984 Long term (current) use of oral hypoglycemic drugs: Secondary | ICD-10-CM

## 2019-05-25 DIAGNOSIS — E559 Vitamin D deficiency, unspecified: Secondary | ICD-10-CM | POA: Diagnosis present

## 2019-05-25 DIAGNOSIS — I252 Old myocardial infarction: Secondary | ICD-10-CM

## 2019-05-25 DIAGNOSIS — I509 Heart failure, unspecified: Secondary | ICD-10-CM | POA: Diagnosis present

## 2019-05-25 DIAGNOSIS — T80818A Extravasation of other vesicant agent, initial encounter: Secondary | ICD-10-CM | POA: Diagnosis not present

## 2019-05-25 DIAGNOSIS — N184 Chronic kidney disease, stage 4 (severe): Secondary | ICD-10-CM | POA: Diagnosis present

## 2019-05-25 DIAGNOSIS — N2581 Secondary hyperparathyroidism of renal origin: Secondary | ICD-10-CM | POA: Diagnosis present

## 2019-05-25 DIAGNOSIS — N186 End stage renal disease: Secondary | ICD-10-CM | POA: Diagnosis present

## 2019-05-25 DIAGNOSIS — I21A1 Myocardial infarction type 2: Secondary | ICD-10-CM | POA: Diagnosis present

## 2019-05-25 DIAGNOSIS — D62 Acute posthemorrhagic anemia: Secondary | ICD-10-CM | POA: Diagnosis present

## 2019-05-25 DIAGNOSIS — E1122 Type 2 diabetes mellitus with diabetic chronic kidney disease: Secondary | ICD-10-CM | POA: Diagnosis present

## 2019-05-25 DIAGNOSIS — M1990 Unspecified osteoarthritis, unspecified site: Secondary | ICD-10-CM | POA: Diagnosis present

## 2019-05-25 DIAGNOSIS — K2980 Duodenitis without bleeding: Secondary | ICD-10-CM | POA: Diagnosis present

## 2019-05-25 DIAGNOSIS — E785 Hyperlipidemia, unspecified: Secondary | ICD-10-CM | POA: Diagnosis present

## 2019-05-25 DIAGNOSIS — D631 Anemia in chronic kidney disease: Secondary | ICD-10-CM | POA: Diagnosis present

## 2019-05-25 DIAGNOSIS — Y848 Other medical procedures as the cause of abnormal reaction of the patient, or of later complication, without mention of misadventure at the time of the procedure: Secondary | ICD-10-CM | POA: Diagnosis not present

## 2019-05-25 DIAGNOSIS — Z6836 Body mass index (BMI) 36.0-36.9, adult: Secondary | ICD-10-CM

## 2019-05-25 DIAGNOSIS — Z8249 Family history of ischemic heart disease and other diseases of the circulatory system: Secondary | ICD-10-CM

## 2019-05-25 DIAGNOSIS — I350 Nonrheumatic aortic (valve) stenosis: Secondary | ICD-10-CM | POA: Diagnosis present

## 2019-05-25 DIAGNOSIS — Z79899 Other long term (current) drug therapy: Secondary | ICD-10-CM

## 2019-05-25 DIAGNOSIS — Z87891 Personal history of nicotine dependence: Secondary | ICD-10-CM

## 2019-05-25 DIAGNOSIS — Z89422 Acquired absence of other left toe(s): Secondary | ICD-10-CM

## 2019-05-25 DIAGNOSIS — Z992 Dependence on renal dialysis: Secondary | ICD-10-CM

## 2019-05-25 DIAGNOSIS — K922 Gastrointestinal hemorrhage, unspecified: Secondary | ICD-10-CM | POA: Diagnosis not present

## 2019-05-25 DIAGNOSIS — Z89412 Acquired absence of left great toe: Secondary | ICD-10-CM

## 2019-05-25 DIAGNOSIS — K921 Melena: Secondary | ICD-10-CM

## 2019-05-25 DIAGNOSIS — Z8614 Personal history of Methicillin resistant Staphylococcus aureus infection: Secondary | ICD-10-CM

## 2019-05-25 DIAGNOSIS — D649 Anemia, unspecified: Secondary | ICD-10-CM | POA: Diagnosis not present

## 2019-05-25 LAB — CBC WITH DIFFERENTIAL/PLATELET
Abs Immature Granulocytes: 0.09 10*3/uL — ABNORMAL HIGH (ref 0.00–0.07)
Basophils Absolute: 0.1 10*3/uL (ref 0.0–0.1)
Basophils Relative: 1 %
Eosinophils Absolute: 0.2 10*3/uL (ref 0.0–0.5)
Eosinophils Relative: 2 %
HCT: 19.8 % — ABNORMAL LOW (ref 39.0–52.0)
Hemoglobin: 6.2 g/dL — CL (ref 13.0–17.0)
Immature Granulocytes: 1 %
Lymphocytes Relative: 8 %
Lymphs Abs: 1 10*3/uL (ref 0.7–4.0)
MCH: 30.5 pg (ref 26.0–34.0)
MCHC: 31.3 g/dL (ref 30.0–36.0)
MCV: 97.5 fL (ref 80.0–100.0)
Monocytes Absolute: 0.7 10*3/uL (ref 0.1–1.0)
Monocytes Relative: 6 %
Neutro Abs: 10 10*3/uL — ABNORMAL HIGH (ref 1.7–7.7)
Neutrophils Relative %: 82 %
Platelets: 149 10*3/uL — ABNORMAL LOW (ref 150–400)
RBC: 2.03 MIL/uL — ABNORMAL LOW (ref 4.22–5.81)
RDW: 17.1 % — ABNORMAL HIGH (ref 11.5–15.5)
WBC: 12.1 10*3/uL — ABNORMAL HIGH (ref 4.0–10.5)
nRBC: 0 % (ref 0.0–0.2)

## 2019-05-25 LAB — SARS CORONAVIRUS 2 BY RT PCR (HOSPITAL ORDER, PERFORMED IN ~~LOC~~ HOSPITAL LAB): SARS Coronavirus 2: NEGATIVE

## 2019-05-25 LAB — COMPREHENSIVE METABOLIC PANEL
ALT: 29 U/L (ref 0–44)
AST: 20 U/L (ref 15–41)
Albumin: 3.1 g/dL — ABNORMAL LOW (ref 3.5–5.0)
Alkaline Phosphatase: 51 U/L (ref 38–126)
Anion gap: 10 (ref 5–15)
BUN: 46 mg/dL — ABNORMAL HIGH (ref 8–23)
CO2: 30 mmol/L (ref 22–32)
Calcium: 8.1 mg/dL — ABNORMAL LOW (ref 8.9–10.3)
Chloride: 98 mmol/L (ref 98–111)
Creatinine, Ser: 4.35 mg/dL — ABNORMAL HIGH (ref 0.61–1.24)
GFR calc Af Amer: 15 mL/min — ABNORMAL LOW (ref >60)
GFR calc non Af Amer: 13 mL/min — ABNORMAL LOW (ref >60)
Glucose, Bld: 163 mg/dL — ABNORMAL HIGH (ref 70–99)
Potassium: 3.6 mmol/L (ref 3.5–5.1)
Sodium: 138 mmol/L (ref 135–145)
Total Bilirubin: 0.4 mg/dL (ref 0.3–1.2)
Total Protein: 5.7 g/dL — ABNORMAL LOW (ref 6.5–8.1)

## 2019-05-25 LAB — LIPASE, BLOOD: Lipase: 34 U/L (ref 11–51)

## 2019-05-25 LAB — AMMONIA: Ammonia: 13 umol/L (ref 9–35)

## 2019-05-25 LAB — LACTIC ACID, PLASMA: Lactic Acid, Venous: 1.3 mmol/L (ref 0.5–1.9)

## 2019-05-25 LAB — PREPARE RBC (CROSSMATCH)

## 2019-05-25 MED ORDER — PANTOPRAZOLE SODIUM 40 MG IV SOLR
40.0000 mg | Freq: Once | INTRAVENOUS | Status: AC
  Administered 2019-05-25: 40 mg via INTRAVENOUS
  Filled 2019-05-25: qty 40

## 2019-05-25 MED ORDER — ONDANSETRON HCL 4 MG PO TABS: 4.0000 mg | ORAL_TABLET | Freq: Four times a day (QID) | ORAL | Status: DC | PRN

## 2019-05-25 MED ORDER — SODIUM CHLORIDE 0.9 % IV BOLUS
1000.0000 mL | Freq: Once | INTRAVENOUS | Status: AC
  Administered 2019-05-25: 17:00:00 1000 mL via INTRAVENOUS

## 2019-05-25 MED ORDER — ACETAMINOPHEN 325 MG PO TABS: 650.0000 mg | ORAL_TABLET | Freq: Four times a day (QID) | ORAL | Status: DC | PRN

## 2019-05-25 MED ORDER — PANTOPRAZOLE SODIUM 40 MG IV SOLR
40.0000 mg | Freq: Two times a day (BID) | INTRAVENOUS | Status: DC
  Administered 2019-05-29 – 2019-06-01 (×7): 40 mg via INTRAVENOUS
  Filled 2019-05-25 (×7): qty 40

## 2019-05-25 MED ORDER — ACETAMINOPHEN 650 MG RE SUPP: 650.0000 mg | Freq: Four times a day (QID) | RECTAL | Status: DC | PRN

## 2019-05-25 MED ORDER — ONDANSETRON HCL 4 MG/2ML IJ SOLN: 4.0000 mg | Freq: Four times a day (QID) | INTRAMUSCULAR | Status: DC | PRN

## 2019-05-25 MED ORDER — SODIUM CHLORIDE 0.9% IV SOLUTION
Freq: Once | INTRAVENOUS | Status: AC
  Administered 2019-05-25: 19:00:00 via INTRAVENOUS

## 2019-05-25 MED ORDER — SODIUM CHLORIDE 0.9 % IV SOLN
8.0000 mg/h | INTRAVENOUS | Status: AC
  Administered 2019-05-25 – 2019-05-28 (×7): 8 mg/h via INTRAVENOUS
  Filled 2019-05-25 (×9): qty 80

## 2019-05-25 NOTE — ED Notes (Signed)
ED TO INPATIENT HANDOFF REPORT  ED Nurse Name and Phone #:  Wells Guiles [phone]  S Name/Age/Gender Mariea Stable [place] 73 y.o. male Room/Bed: 021C/021C  Code Status   Code Status: Prior  Home/SNF/Other Home Patient oriented to: self, place, time and situation Is this baseline? Yes   Triage Complete: Triage complete  Chief Complaint abnormal lab  Triage Note Pt went to dialysis yesterday and got a call today from the nurse at dialysis telling him his hemoglobin was 6.2.    Allergies No Known Allergies  Level of Care/Admitting Diagnosis ED Disposition    ED Disposition Condition [facility] Area: [place] [[id number]]  Level of Care: Progressive [102]  Covid Evaluation: Asymptomatic Screening Protocol (No Symptoms)  Diagnosis: Acute GI bleeding [008676]  Admitting Physician: Rise Patience [phone]  Attending Physician: Rise Patience [phone]  Estimated length of stay: past midnight tomorrow  Certification:: I certify this patient will need inpatient services for at least 2 midnights  PT Class (Do Not Modify): Inpatient [101]  PT Acc Code (Do Not Modify): Private [1]       B Medical/Surgery History Past Medical History:  Diagnosis Date  . Anemia   . Anesthesia complication    General anesthesia makes me feel foggy for long periods of time  . Arthritis   . CKD (chronic kidney disease) stage V requiring chronic dialysis ([place])    Stage 4-5  . Complication of anesthesia    slow to come out of anesthesia - a lot of confusion   . Diabetes ([place])   . Fournier gangrene   . GI bleed   . Heart murmur    Born with, no problems  . History of blood transfusion    x2  . Hyperlipidemia   . Hypertension   . Moderate aortic stenosis   . NSTEMI (non-ST elevated myocardial infarction) ([place])   . Obesity   . OSA on CPAP   . Osteomyelitis ([place]) 2016   L toe  . Pulmonary hypertension ([place])   . Renal atrophy, left   .  Vitamin D deficiency    Past Surgical History:  Procedure Laterality Date  . AMPUTATION TOE Left 04/20/2019   Procedure: AMPUTATION LEFT SECOND AND THIRD TOES;  Surgeon: Newt Minion, MD;  Location: [facility];  Service: Orthopedics;  Laterality: Left;  . AV FISTULA PLACEMENT Left 05/16/2019   Procedure: ARTERIOVENOUS (AV) FISTULA CREATION;  Surgeon: Serafina Mitchell, MD;  Location: [facility];  Service: Vascular;  Laterality: Left;  . BIOPSY  05/13/2019   Procedure: BIOPSY;  Surgeon: Laurence Spates, MD;  Location: [facility];  Service: Endoscopy;;  . COLONOSCOPY    . COLOSTOMY  03/2015  . COLOSTOMY REVERSAL    . ESOPHAGOGASTRODUODENOSCOPY N/A 05/13/2019   Procedure: ESOPHAGOGASTRODUODENOSCOPY (EGD);  Surgeon: Laurence Spates, MD;  Location: [facility] ENDOSCOPY;  Service: Endoscopy;  Laterality: N/A;  . INSERTION OF DIALYSIS CATHETER Right 05/16/2019   Procedure: INSERTION OF DIALYSIS CATHETER;  Surgeon: Serafina Mitchell, MD;  Location: [facility] OR;  Service: Vascular;  Laterality: Right;  . IR THORACENTESIS ASP PLEURAL SPACE W/IMG GUIDE  05/15/2019  . TOE AMPUTATION Left 2016   Left big toe amputated due to MRSA infection     A IV Location/Drains/Wounds Patient Lines/Drains/Airways Status   Active Line/Drains/Airways    Name:   Placement date:   Placement time:   Site:   Days:   Peripheral IV 05/25/19 Right Arm   05/25/19  1707    Arm   less than 1   Peripheral IV 05/25/19 Right;Lateral;Upper Forearm   05/25/19    1947    Forearm   less than 1   Fistula / Graft Left Upper arm Arteriovenous fistula   05/16/19    1437    Upper arm   9   Hemodialysis Catheter Right Subclavian Double lumen Permanent (Tunneled)   05/16/19    1338    Subclavian   9   Incision (Closed) 04/20/19 Foot Left   04/20/19    1402     35   Incision (Closed) 05/16/19 Chest Right   05/16/19    1403     9   Incision (Closed) 05/16/19 Arm Left   05/16/19    1403     9          Intake/Output Last 24 hours No intake or output data in the  24 hours ending 05/25/19 1951  Labs/Imaging Results for orders placed or performed during the hospital encounter of 05/25/19 (from the past 48 hour(s))  Comprehensive metabolic panel     Status: Abnormal   Collection Time: 05/25/19  4:42 PM  Result Value Ref Range   Sodium 138 135 - 145 mmol/L   Potassium 3.6 3.5 - 5.1 mmol/L   Chloride 98 98 - 111 mmol/L   CO2 30 22 - 32 mmol/L   Glucose, Bld 163 (H) 70 - 99 mg/dL   BUN 46 (H) 8 - 23 mg/dL   Creatinine, Ser 4.35 (H) 0.61 - 1.24 mg/dL   Calcium 8.1 (L) 8.9 - 10.3 mg/dL   Total Protein 5.7 (L) 6.5 - 8.1 g/dL   Albumin 3.1 (L) 3.5 - 5.0 g/dL   AST 20 15 - 41 U/L   ALT 29 0 - 44 U/L   Alkaline Phosphatase 51 38 - 126 U/L   Total Bilirubin 0.4 0.3 - 1.2 mg/dL   GFR calc non Af Amer 13 (L) >60 mL/min   GFR calc Af Amer 15 (L) >60 mL/min   Anion gap 10 5 - 15    Comment: Performed at [facility], [address] [place], [place] [address]  CBC WITH DIFFERENTIAL     Status: Abnormal   Collection Time: 05/25/19  4:42 PM  Result Value Ref Range   WBC 12.1 (H) 4.0 - 10.5 K/uL   RBC 2.03 (L) 4.22 - 5.81 MIL/uL   Hemoglobin 6.2 (LL) 13.0 - 17.0 g/dL    Comment: REPEATED TO VERIFY POST TRANSFUSION SPECIMEN THIS CRITICAL RESULT HAS VERIFIED AND BEEN CALLED TO H HARDY,RN BY WALTER BOND ON 07 10 2020 AT 1823, AND HAS BEEN READ BACK.     HCT 19.8 (L) 39.0 - 52.0 %   MCV 97.5 80.0 - 100.0 fL   MCH 30.5 26.0 - 34.0 pg   MCHC 31.3 30.0 - 36.0 g/dL   RDW 17.1 (H) 11.5 - 15.5 %   Platelets 149 (L) 150 - 400 K/uL   nRBC 0.0 0.0 - 0.2 %   Neutrophils Relative % 82 %   Neutro Abs 10.0 (H) 1.7 - 7.7 K/uL   Lymphocytes Relative 8 %   Lymphs Abs 1.0 0.7 - 4.0 K/uL   Monocytes Relative 6 %   Monocytes Absolute 0.7 0.1 - 1.0 K/uL   Eosinophils Relative 2 %   Eosinophils Absolute 0.2 0.0 - 0.5 K/uL   Basophils Relative 1 %   Basophils Absolute 0.1 0.0 - 0.1 K/uL  Immature Granulocytes 1 %   Abs Immature Granulocytes 0.09 (H) 0.00 - 0.07  K/uL    Comment: Performed at [facility], [place] [address] [place], [place] [address]  Lipase, blood     Status: None   Collection Time: 05/25/19  4:42 PM  Result Value Ref Range   Lipase 34 11 - 51 U/L    Comment: Performed at [facility] [address], [place], [place] [address]  Ammonia     Status: None   Collection Time: 05/25/19  4:44 PM  Result Value Ref Range   Ammonia 13 9 - 35 umol/L    Comment: Performed at [facility], [place] [address], [place], [place] [address]  Lactic acid, plasma     Status: None   Collection Time: 05/25/19  4:44 PM  Result Value Ref Range   Lactic Acid, Venous 1.3 0.5 - 1.9 mmol/L    Comment: Performed at [facility] [address] [place], [place] [address]  Type and screen [place]     Status: None (Preliminary result)   Collection Time: 05/25/19  5:07 PM  Result Value Ref Range   ABO/RH(D) O POS    Antibody Screen NEG    Sample Expiration 05/28/2019,2359    Unit Number [id number]    Blood Component Type RBC LR PHER1    Unit division 00    Status of Unit ISSUED    Transfusion Status OK TO TRANSFUSE    Crossmatch Result      Compatible Performed at [facility], [place] [address], [place], [place] [address]    Unit Number [id number]    Blood Component Type RED CELLS,LR    Unit division 00    Status of Unit ALLOCATED    Transfusion Status OK TO TRANSFUSE    Crossmatch Result Compatible   Prepare RBC     Status: None   Collection Time: 05/25/19  6:27 PM  Result Value Ref Range   Order Confirmation      ORDER PROCESSED BY BLOOD BANK Performed at [facility], [place] [address], [place], [place] [address]    Dg Chest Portable 1 View  Result Date: 05/25/2019 CLINICAL DATA:  Hypotension. EXAM: PORTABLE CHEST 1 VIEW COMPARISON:  May 16, 2019 FINDINGS: There is a well-positioned tunneled dialysis catheter. The heart size is enlarged but stable from prior study. There is no  pneumothorax. The lung volumes are somewhat low. There is mild vascular congestion without overt pulmonary edema. There may be atelectasis at the lung bases. IMPRESSION: No active disease. Electronically Signed   By: Constance Holster M.D.   On: 05/25/2019 16:47    Pending Labs Unresulted Labs (From admission, onward)    Start     Ordered   05/25/19 1911  SARS Coronavirus 2 (CEPHEID - Performed in [place] hospital lab), [facility] Order  Once,   STAT    Question:  Rule Out  Answer:  Yes   05/25/19 1910          Vitals/Pain Today's Vitals   05/25/19 1830 05/25/19 1900 05/25/19 1912 05/25/19 1924  BP: 91/62 (!) 100/52 (!) 108/51   Pulse: 62 90 87   Resp: 19 20 17    Temp:   98.3 F (36.8 C)   TempSrc:   Oral   SpO2: 99% 99% 98%   Weight:      Height:      PainSc:  0-No pain    Isolation Precautions No active isolations  Medications Medications  sodium chloride 0.9 % bolus 1,000 mL (1,000 mLs Intravenous New Bag/Given 05/25/19 1707)  pantoprazole (PROTONIX) injection 40 mg (40 mg Intravenous Given 05/25/19 1759)  0.9 %  sodium chloride infusion (Manually program via Guardrails IV Fluids) ( Intravenous New Bag/Given 05/25/19 1831)    Mobility walks with person assist - pt states feeling weakness when ambulating.  High fall risk   Focused Assessments GI   R Recommendations: See Admitting Provider Note  Report given to:   Additional Notes:

## 2019-05-25 NOTE — Telephone Encounter (Signed)
Tillie Rung ([place]) called to report abnormal/irregular HR.  Tillie Rung states pt's HR is fluxuating between 120/127/67/65 with some pauses.  Pt reports feeling tired and is still having black stools.  Tillie Rung would like a call back to let her know how Dr Larose Kells would like to proceed.    Tillie Rung: [phone]

## 2019-05-25 NOTE — ED Provider Notes (Signed)
[facility] EMERGENCY DEPARTMENT Provider Note   CSN: [id number] Arrival date & time: 05/25/19  1540    History   Chief Complaint Chief Complaint  Patient presents with   low bp    HPI Louis Bradley is a 73 y.o. male.     HPI  73 year old male patient with past medical history significant for recent admission for a GI bleed, discharged 1 week ago on 7/3.  Newly diagnosed ESRD on hemodialysis (T/Th/S).  He went to dialysis yesterday and had blood drawn following treatment.  He says he was at home today and received a phone call stating that his hemoglobin was 6.2 and that he needed to come to the ED.  He denies any current abdominal pain, nausea, vomiting or diarrhea.  His last bowel movement was yesterday and it was black in color, which he says has been the case since even before his last admission.  He also denies any headache.  He does report feeling lightheaded and often dizzy on standing.  Past Medical History:  Diagnosis Date   Anemia    Anesthesia complication    General anesthesia makes me feel foggy for long periods of time   Arthritis    CKD (chronic kidney disease) stage V requiring chronic dialysis ([place])    Stage 4-5   Complication of anesthesia    slow to come out of anesthesia - a lot of confusion    Diabetes ([place])    Fournier gangrene    GI bleed    Heart murmur    Born with, no problems   History of blood transfusion    x2   Hyperlipidemia    Hypertension    Moderate aortic stenosis    NSTEMI (non-ST elevated myocardial infarction) ([place])    Obesity    OSA on CPAP    Osteomyelitis ([place]) 2016   L toe   Pulmonary hypertension ([place])    Renal atrophy, left    Vitamin D deficiency     Patient Active Problem List   Diagnosis Date Noted   Acute GI bleeding 05/25/2019   ESRD (end stage renal disease) ([place]) 05/25/2019   Atrial fibrillation ([place]) 05/14/2019   Volume overload 05/14/2019   Pulmonary  hypertension ([place]) 05/14/2019   Hypotension 05/12/2019   Pneumonia 05/12/2019   Acute respiratory failure ([place]) 05/12/2019   Sepsis ([place]) 05/12/2019   Acute lower UTI 05/12/2019   GI bleed 05/10/2019   Acute blood loss anemia 05/10/2019   Amputated toe, left ([place]) 05/02/2019   Onychomycosis 05/02/2019   Venous insufficiency of both lower extremities 05/02/2019   Hyperlipidemia associated with type 2 diabetes mellitus ([place]) 04/26/2019   Osteomyelitis of third toe of left foot ([place]) 04/17/2019   Osteomyelitis of second toe of left foot ([place]) 04/17/2019   Bilateral primary osteoarthritis of knee 04/17/2019   PCP NOTES >>>>>>>>>>>>>>> 03/01/2019   Aortic stenosis 12/18/2018   Iron deficiency anemia 09/09/2018   S/P colostomy ([place]) 09/05/2016   Vitamin D deficiency 04/26/2016   Chronic renal failure in pediatric patient, stage 4 (severe) ([place]) 04/09/2015   Diabetes mellitus type 2 with complications ([place]) 89/21/1941   Essential hypertension 04/08/2015   Hyponatremia 04/08/2015   Hyperkalemia 05/22/2012    Past Surgical History:  Procedure Laterality Date   AMPUTATION TOE Left 04/20/2019   Procedure: AMPUTATION LEFT SECOND AND THIRD TOES;  Surgeon: Newt Minion, MD;  Location: [place];  Service: Orthopedics;  Laterality: Left;   AV FISTULA PLACEMENT Left 05/16/2019  Procedure: ARTERIOVENOUS (AV) FISTULA CREATION;  Surgeon: Serafina Mitchell, MD;  Location: [place];  Service: Vascular;  Laterality: Left;   BIOPSY  05/13/2019   Procedure: BIOPSY;  Surgeon: Laurence Spates, MD;  Location: [place];  Service: Endoscopy;;   COLONOSCOPY     COLOSTOMY  03/2015   COLOSTOMY REVERSAL     ESOPHAGOGASTRODUODENOSCOPY N/A 05/13/2019   Procedure: ESOPHAGOGASTRODUODENOSCOPY (EGD);  Surgeon: Laurence Spates, MD;  Location: [facility] ENDOSCOPY;  Service: Endoscopy;  Laterality: N/A;   INSERTION OF DIALYSIS CATHETER Right 05/16/2019   Procedure: INSERTION OF DIALYSIS CATHETER;   Surgeon: Serafina Mitchell, MD;  Location: [facility] OR;  Service: Vascular;  Laterality: Right;   IR THORACENTESIS ASP PLEURAL SPACE W/IMG GUIDE  05/15/2019   TOE AMPUTATION Left 2016   Left big toe amputated due to MRSA infection        Home Medications    Prior to Admission medications   Medication Sig Start Date End Date Taking? Authorizing Provider  calcitRIOL (ROCALTROL) 0.25 MCG capsule Take 0.25 mcg by mouth every Monday, Wednesday, and Friday.  03/22/19  Yes [provider]  calcium acetate (PHOSLO) 667 MG capsule Take 1 capsule (667 mg total) by mouth 3 (three) times daily with meals. 05/18/19  Yes Shelly Coss, MD  carvedilol (COREG) 3.125 MG tablet Take 1 tablet (3.125 mg total) by mouth 2 (two) times daily with a meal. 05/18/19  Yes Adhikari, Amrit, MD  glipiZIDE (GLUCOTROL) 5 MG tablet Take 2 tablets (10 mg total) by mouth 2 (two) times daily. 04/20/19  Yes Paz, Alda Berthold, MD  nicotine polacrilex (COMMIT) 4 MG lozenge Take 4 mg by mouth as needed for smoking cessation.   Yes [provider]  pantoprazole (PROTONIX) 40 MG tablet Take 1 tablet (40 mg total) by mouth 2 (two) times daily. 05/11/19  Yes Allie Bossier, MD  pravastatin (PRAVACHOL) 40 MG tablet Take 1 tablet (40 mg total) by mouth at bedtime. 03/13/19  Yes Colon Branch, MD  Probiotic Product (PROBIOTIC DAILY PO) Take 1 tablet by mouth every Monday, Wednesday, and Friday.    Yes [provider]    Family History Family History  Problem Relation Age of Onset   CAD Father 69   CAD Other        2 uncles had heart attacks   Colon cancer Neg Hx    Prostate cancer Neg Hx     Social History Social History   Tobacco Use   Smoking status: Former Smoker   Smokeless tobacco: Never Used   Tobacco comment: quit 12/2018 (1 ppd)  Substance Use Topics   Alcohol use: Yes    Comment: social   Drug use: Never     Allergies   Patient has no known allergies.   Review of Systems Review of Systems   Constitutional: Positive for fatigue. Negative for activity change, appetite change, chills, diaphoresis and fever.  HENT: Negative for facial swelling, trouble swallowing and voice change.   Eyes: Negative for visual disturbance.  Respiratory: Negative for cough, chest tightness, shortness of breath and wheezing.   Cardiovascular: Negative for chest pain and palpitations.  Gastrointestinal: Positive for blood in stool (black). Negative for abdominal pain, nausea and vomiting.  Genitourinary: Positive for decreased urine volume. Negative for dysuria, flank pain, frequency and hematuria.  Musculoskeletal: Negative for arthralgias, back pain, neck pain and neck stiffness.  Skin: Negative for color change and wound.  Neurological: Positive for weakness. Negative for dizziness and light-headedness.  Psychiatric/Behavioral:  Positive for agitation and confusion (reported by his wife).     Physical Exam Updated Vital Signs BP (!) 133/54 (BP Location: Left Arm)    Pulse 91    Temp 98.6 F (37 C) (Oral)    Resp 18    Ht 6\' 2"  (1.88 m)    Wt 129.4 kg    SpO2 100%    BMI 36.63 kg/m   Physical Exam Vitals signs and nursing note reviewed.  Constitutional:      Appearance: He is well-developed. He is obese. He is not ill-appearing or diaphoretic.  HENT:     Head: Normocephalic and atraumatic.     Right Ear: External ear normal.     Left Ear: External ear normal.  Eyes:     Conjunctiva/sclera: Conjunctivae normal.  Neck:     Musculoskeletal: Neck supple.  Cardiovascular:     Rate and Rhythm: Tachycardia present. Rhythm irregularly irregular.     Heart sounds: Murmur present. Systolic murmur present with a grade of 2/6.  Pulmonary:     Effort: Pulmonary effort is normal. No respiratory distress.     Breath sounds: Normal breath sounds.  Abdominal:     Palpations: Abdomen is soft. There is no mass.     Tenderness: There is no abdominal tenderness. There is no guarding.  Skin:    General:  Skin is warm and dry.  Neurological:     General: No focal deficit present.     Mental Status: He is alert.      ED Treatments / Results  Labs (all labs ordered are listed, but only abnormal results are displayed) Labs Reviewed  COMPREHENSIVE METABOLIC PANEL - Abnormal; Notable for the following components:      Result Value   Glucose, Bld 163 (*)    BUN 46 (*)    Creatinine, Ser 4.35 (*)    Calcium 8.1 (*)    Total Protein 5.7 (*)    Albumin 3.1 (*)    GFR calc non Af Amer 13 (*)    GFR calc Af Amer 15 (*)    All other components within normal limits  CBC WITH DIFFERENTIAL/PLATELET - Abnormal; Notable for the following components:   WBC 12.1 (*)    RBC 2.03 (*)    Hemoglobin 6.2 (*)    HCT 19.8 (*)    RDW 17.1 (*)    Platelets 149 (*)    Neutro Abs 10.0 (*)    Abs Immature Granulocytes 0.09 (*)    All other components within normal limits  HEMOGLOBIN AND HEMATOCRIT, BLOOD - Abnormal; Notable for the following components:   Hemoglobin 6.2 (*)    HCT 19.0 (*)    All other components within normal limits  GLUCOSE, CAPILLARY - Abnormal; Notable for the following components:   Glucose-Capillary 105 (*)    All other components within normal limits  GLUCOSE, CAPILLARY - Abnormal; Notable for the following components:   Glucose-Capillary 154 (*)    All other components within normal limits  HEMOGLOBIN AND HEMATOCRIT, BLOOD - Abnormal; Notable for the following components:   Hemoglobin 7.6 (*)    HCT 23.6 (*)    All other components within normal limits  HEMOGLOBIN AND HEMATOCRIT, BLOOD - Abnormal; Notable for the following components:   Hemoglobin 7.5 (*)    HCT 23.2 (*)    All other components within normal limits  GLUCOSE, CAPILLARY - Abnormal; Notable for the following components:   Glucose-Capillary 133 (*)    All other  components within normal limits  GLUCOSE, CAPILLARY - Abnormal; Notable for the following components:   Glucose-Capillary 121 (*)    All other  components within normal limits  TROPONIN I (HIGH SENSITIVITY) - Abnormal; Notable for the following components:   Troponin I (High Sensitivity) 4,342 (*)    All other components within normal limits  TROPONIN I (HIGH SENSITIVITY) - Abnormal; Notable for the following components:   Troponin I (High Sensitivity) 5,401 (*)    All other components within normal limits  SARS CORONAVIRUS 2 (HOSPITAL ORDER, PERFORMED IN [facility])  NOVEL CORONAVIRUS, NAA (HOSPITAL ORDER, SEND-OUT TO REF LAB)  AMMONIA  LACTIC ACID, PLASMA  LIPASE, BLOOD  GLUCOSE, CAPILLARY  BASIC METABOLIC PANEL  CBC  TYPE AND SCREEN  PREPARE RBC (CROSSMATCH)  PREPARE RBC (CROSSMATCH)    EKG EKG Interpretation  Date/Time:  Friday May 25 2019 18:17:21 EDT Ventricular Rate:  91 PR Interval:    QRS Duration: 98 QT Interval:  433 QTC Calculation: 484 R Axis:   4 Text Interpretation:  Atrial fibrillation Repol abnrm, severe global ischemia (LM/MVD) Baseline wander in lead(s) V3 st depression more pronounce in inferior leads from prior Confirmed by Deno Etienne [phone]) on 05/25/2019 6:22:16 PM   Radiology Dg Chest Portable 1 View  Result Date: 05/25/2019 CLINICAL DATA:  Hypotension. EXAM: PORTABLE CHEST 1 VIEW COMPARISON:  May 16, 2019 FINDINGS: There is a well-positioned tunneled dialysis catheter. The heart size is enlarged but stable from prior study. There is no pneumothorax. The lung volumes are somewhat low. There is mild vascular congestion without overt pulmonary edema. There may be atelectasis at the lung bases. IMPRESSION: No active disease. Electronically Signed   By: Constance Holster M.D.   On: 05/25/2019 16:47    Procedures Ultrasound ED Peripheral IV (Provider)  Date/Time: 05/25/2019 7:07 PM Performed by: Jefm Petty, MD Authorized by: Deno Etienne, DO   Procedure details:    Indications: hypotension, multiple failed IV attempts and poor IV access     Skin Prep: chlorhexidine  gluconate     Location:  Right AC   Angiocath:  18 G   Bedside Ultrasound Guided: Yes     Images: not archived     Patient tolerated procedure without complications: Yes     Dressing applied: Yes     (including critical care time)  Medications Ordered in ED Medications  acetaminophen (TYLENOL) tablet 650 mg (has no administration in time range)    Or  acetaminophen (TYLENOL) suppository 650 mg (has no administration in time range)  ondansetron (ZOFRAN) tablet 4 mg (has no administration in time range)    Or  ondansetron (ZOFRAN) injection 4 mg (has no administration in time range)  pantoprazole (PROTONIX) 80 mg in sodium chloride 0.9 % 250 mL (0.32 mg/mL) infusion (8 mg/hr Intravenous New Bag/Given 05/26/19 0854)  pantoprazole (PROTONIX) injection 40 mg (has no administration in time range)  ferric gluconate (NULECIT) 125 mg in sodium chloride 0.9 % 100 mL IVPB (has no administration in time range)  doxercalciferol (HECTOROL) injection 1 mcg (has no administration in time range)  Darbepoetin Alfa (ARANESP) injection 60 mcg (has no administration in time range)  0.9 %  sodium chloride infusion (has no administration in time range)  sodium chloride 0.9 % bolus 1,000 mL (0 mLs Intravenous Stopped 05/25/19 1900)  pantoprazole (PROTONIX) injection 40 mg (40 mg Intravenous Given 05/25/19 1759)  0.9 %  sodium chloride infusion (Manually program via Guardrails IV Fluids) ( Intravenous Transfusing/Transfer  05/25/19 2027)  0.9 %  sodium chloride infusion (Manually program via Guardrails IV Fluids) ( Intravenous New Bag/Given 05/26/19 0152)     Initial Impression / Assessment and Plan / ED Course  I have reviewed the triage vital signs and the nursing notes.  Pertinent labs & imaging results that were available during my care of the patient were reviewed by me and considered in my medical decision making (see chart for details).  Differentials considered: Acute Upper GI bleed, Lower GIB, SIRS,  ACS   Medical Decision Making:  Louis Bradley is a 73 y.o. male with a pertinent PMHx of HTN, ESRD recently started on HD during his last hospitalization (less than two weeks ago), OSA on CPAP and two recent admissions for GIB who presents to the ED with generalized weakness and reported anemia at 6.2 from blood work obtained yesterday at dialysis.  He arrived afebrile and hemodynamically unstable with heart rate in the 130s and blood pressure 79/59, slightly pale, though mentating well and GCS of 15.  Noted on entering the room that there had already been several attempts to obtain IV access, so therefore given his hemodynamic instability, I urgently placed an US-guided 18g peripheral IV into his RUE and 1LNS bolus was started with labs obtained including type/screen and subsequent transfusion orders once CBC confirmed Hgb of 6.2. IV protonix given.   [facility] was consulted as the patient is already established with them during the last admission.  They will see the patient in the hospital and advised current plan (above) appropriate at this time. No indications at this time of active hemorrhaging or need for emergent EGD. Hospitalist contacted for admission. BP HR improved and pt hemodynamically stable at time of admission.   CLINICAL IMPRESSION: 1. Gastrointestinal hemorrhage with melena   2. Acute blood loss anemia   3. Atrial fibrillation, unspecified type ([place])      Disposition: Admit   The plan for this patient was discussed with my attending physician, Dr. Deno Etienne, who voiced agreement and who oversaw evaluation and treatment of this patient.   Odena Mcquaid A. Jimmye Norman, MD Resident Physician, PGY-3 Emergency Medicine [facility]    Jefm Petty, MD 05/26/19 [place], [place], DO 05/28/19 [phone]

## 2019-05-25 NOTE — Progress Notes (Addendum)
Pt has arrived to [facility]. Ambulated from stretcher to bed, study gait, no gait abnormalities. Pt identified appropriately, alert and oriented x 4. Unit of RBCs infusing, VS stable.  Pt placed on progressive care monitor and CCMD notified.  Oriented to room and equipment, instructed on how to use call bell and understanding verbalized by pt. Pt instructed to call nursing staff for assistance, and call bell within reach. Bed alarm in place. Will continue to monitor and treat pt per MD orders.  Hal Hope, MD aware of pt arrival to floor.   Spoken to [place], MD. RN instructed to transfuse 1 unit of RBCs, check H&H, and page MD with results.

## 2019-05-25 NOTE — ED Triage Notes (Signed)
Pt went to dialysis yesterday and got a call today from the nurse at dialysis telling him his hemoglobin was 6.2.

## 2019-05-25 NOTE — H&P (Signed)
History and Physical    Volney Reierson Huxford NWG:[id number] DOB: 18-Oct-1946 DOA: 05/25/2019  PCP: Colon Branch, MD  Patient coming from: Home.  Chief Complaint: Low hemoglobin.  HPI: Louis Bradley is a 73 y.o. male with history of chronic kidney disease recently started on hemodialysis during last admission discharged on May 17, 2029 a week ago after being admitted for shortness of breath was found to have acute GI bleed and during which patient also underwent dialysis catheter placement and dialysis was started had gone for his routine dialysis yesterday and had labs done which showed a low hemoglobin of 6.2 and was advised to come to the ER.  Patient denies any chest pain shortness of breath nausea vomiting abdominal pain or diarrhea.  During last admission patient also had non-ST elevation MI and was at this time conservatively managed due to patient's acute GI bleed.  ED Course: In the ER patient was hypotensive with blood pressure in the 57Q systolic was given 1 L fluid bolus.  Hemoglobin was 6.2 on-call [facility] gastroenterologist was consulted patient started on Protonix and 2 units of PRBC transfusion ordered.  At the time of my exam patient denies any chest pain or shortness of breath.  EKG shows possible A. fib with ST depression in the lateral leads which are comparable to the old EKG.  Review of Systems: As per HPI, rest all negative.   Past Medical History:  Diagnosis Date   Anemia    Anesthesia complication    General anesthesia makes me feel foggy for long periods of time   Arthritis    CKD (chronic kidney disease) stage V requiring chronic dialysis ([place])    Stage 4-5   Complication of anesthesia    slow to come out of anesthesia - a lot of confusion    Diabetes ([place])    Fournier gangrene    GI bleed    Heart murmur    Born with, no problems   History of blood transfusion    x2   Hyperlipidemia    Hypertension    Moderate aortic stenosis    NSTEMI (non-ST  elevated myocardial infarction) ([place])    Obesity    OSA on CPAP    Osteomyelitis ([place]) 2016   L toe   Pulmonary hypertension ([place])    Renal atrophy, left    Vitamin D deficiency     Past Surgical History:  Procedure Laterality Date   AMPUTATION TOE Left 04/20/2019   Procedure: AMPUTATION LEFT SECOND AND THIRD TOES;  Surgeon: Newt Minion, MD;  Location: [facility];  Service: Orthopedics;  Laterality: Left;   AV FISTULA PLACEMENT Left 05/16/2019   Procedure: ARTERIOVENOUS (AV) FISTULA CREATION;  Surgeon: Serafina Mitchell, MD;  Location: [facility];  Service: Vascular;  Laterality: Left;   BIOPSY  05/13/2019   Procedure: BIOPSY;  Surgeon: Laurence Spates, MD;  Location: [facility];  Service: Endoscopy;;   COLONOSCOPY     COLOSTOMY  03/2015   COLOSTOMY REVERSAL     ESOPHAGOGASTRODUODENOSCOPY N/A 05/13/2019   Procedure: ESOPHAGOGASTRODUODENOSCOPY (EGD);  Surgeon: Laurence Spates, MD;  Location: [facility] ENDOSCOPY;  Service: Endoscopy;  Laterality: N/A;   INSERTION OF DIALYSIS CATHETER Right 05/16/2019   Procedure: INSERTION OF DIALYSIS CATHETER;  Surgeon: Serafina Mitchell, MD;  Location: [facility];  Service: Vascular;  Laterality: Right;   IR THORACENTESIS ASP PLEURAL SPACE W/IMG GUIDE  05/15/2019   TOE AMPUTATION Left 2016   Left big toe amputated due to MRSA  infection     reports that he has quit smoking. He has never used smokeless tobacco. He reports current alcohol use. He reports that he does not use drugs.  No Known Allergies  Family History  Problem Relation Age of Onset   CAD Father 29   CAD Other        2 uncles had heart attacks   Colon cancer Neg Hx    Prostate cancer Neg Hx     Prior to Admission medications   Medication Sig Start Date End Date Taking? Authorizing Provider  calcitRIOL (ROCALTROL) 0.25 MCG capsule Take 0.25 mcg by mouth every Monday, Wednesday, and Friday.  03/22/19  Yes [provider]  calcium acetate (PHOSLO) 667 MG capsule Take 1 capsule (667  mg total) by mouth 3 (three) times daily with meals. 05/18/19  Yes Shelly Coss, MD  carvedilol (COREG) 3.125 MG tablet Take 1 tablet (3.125 mg total) by mouth 2 (two) times daily with a meal. 05/18/19  Yes Adhikari, Amrit, MD  glipiZIDE (GLUCOTROL) 5 MG tablet Take 2 tablets (10 mg total) by mouth 2 (two) times daily. 04/20/19  Yes Paz, Alda Berthold, MD  nicotine polacrilex (COMMIT) 4 MG lozenge Take 4 mg by mouth as needed for smoking cessation.   Yes [provider]  pantoprazole (PROTONIX) 40 MG tablet Take 1 tablet (40 mg total) by mouth 2 (two) times daily. 05/11/19  Yes Allie Bossier, MD  pravastatin (PRAVACHOL) 40 MG tablet Take 1 tablet (40 mg total) by mouth at bedtime. 03/13/19  Yes Colon Branch, MD  Probiotic Product (PROBIOTIC DAILY PO) Take 1 tablet by mouth every Monday, Wednesday, and Friday.    Yes [provider]    Physical Exam: Constitutional: Moderately built and nourished. Vitals:   05/25/19 1912 05/25/19 1915 05/25/19 2005 05/25/19 2019  BP: (!) 108/51  (!) 94/54 (!) 104/49  Pulse: 87  90 (!) 42  Resp: 17  15 13   Temp: 98.3 F (36.8 C)  98.1 F (36.7 C)   TempSrc: Oral  Oral   SpO2: 98% 99% 97% 98%  Weight:      Height:       Eyes: Anicteric no pallor. ENMT: No discharge from the ears eyes nose or mouth. Neck: No mass felt.  No neck rigidity. Respiratory: No rhonchi or crepitations. Cardiovascular: S1-S2 heard. Abdomen: Soft nontender bowel sounds present. Musculoskeletal: No edema.  No joint effusion. Skin: No rash. Neurologic: Alert awake oriented to time place and person.  Moves all extremities. Psychiatric: Appears normal.   Labs on Admission: I have personally reviewed following labs and imaging studies  CBC: Recent Labs  Lab 05/25/19 1642  WBC 12.1*  NEUTROABS 10.0*  HGB 6.2*  HCT 19.8*  MCV 97.5  PLT 003*   Basic Metabolic Panel: Recent Labs  Lab 05/25/19 1642  NA 138  K 3.6  CL 98  CO2 30  GLUCOSE 163*  BUN 46*    CREATININE 4.35*  CALCIUM 8.1*   GFR: Estimated Creatinine Clearance: 21.8 mL/min (A) (by C-G formula based on SCr of 4.35 mg/dL (H)). Liver Function Tests: Recent Labs  Lab 05/25/19 1642  AST 20  ALT 29  ALKPHOS 51  BILITOT 0.4  PROT 5.7*  ALBUMIN 3.1*   Recent Labs  Lab 05/25/19 1642  LIPASE 34   Recent Labs  Lab 05/25/19 1644  AMMONIA 13   Coagulation Profile: No results for input(s): INR, PROTIME in the last 168 hours. Cardiac Enzymes: No results  for input(s): CKTOTAL, CKMB, CKMBINDEX, TROPONINI in the last 168 hours. BNP (last 3 results) No results for input(s): PROBNP in the last 8760 hours. HbA1C: No results for input(s): HGBA1C in the last 72 hours. CBG: No results for input(s): GLUCAP in the last 168 hours. Lipid Profile: No results for input(s): CHOL, HDL, LDLCALC, TRIG, CHOLHDL, LDLDIRECT in the last 72 hours. Thyroid Function Tests: No results for input(s): TSH, T4TOTAL, FREET4, T3FREE, THYROIDAB in the last 72 hours. Anemia Panel: No results for input(s): VITAMINB12, FOLATE, FERRITIN, TIBC, IRON, RETICCTPCT in the last 72 hours. Urine analysis:    Component Value Date/Time   COLORURINE YELLOW 05/12/2019 1600   APPEARANCEUR HAZY (A) 05/12/2019 1600   LABSPEC 1.013 05/12/2019 1600   PHURINE 5.0 05/12/2019 1600   GLUCOSEU 50 (A) 05/12/2019 1600   HGBUR SMALL (A) 05/12/2019 1600   BILIRUBINUR NEGATIVE 05/12/2019 1600   KETONESUR NEGATIVE 05/12/2019 1600   PROTEINUR 100 (A) 05/12/2019 1600   NITRITE NEGATIVE 05/12/2019 1600   LEUKOCYTESUR LARGE (A) 05/12/2019 1600   Sepsis Labs: @LABRCNTIP (procalcitonin:4,lacticidven:4) ) Recent Results (from the past 240 hour(s))  Surgical pcr screen     Status: None   Collection Time: 05/16/19 10:31 AM   Specimen: Nasal Mucosa; Nasal Swab  Result Value Ref Range Status   MRSA, PCR NEGATIVE NEGATIVE Final   Staphylococcus aureus NEGATIVE NEGATIVE Final    Comment: (NOTE) The Xpert SA Assay (FDA approved  for NASAL specimens in patients 76 years of age and older), is one component of a comprehensive surveillance program. It is not intended to diagnose infection nor to guide or monitor treatment. Performed at [facility], [place] [address] [place], [place] [address]   SARS Coronavirus 2 (CEPHEID - Performed in [facility] hospital lab), Hosp Order     Status: None   Collection Time: 05/25/19  7:11 PM   Specimen: Nasopharyngeal Swab  Result Value Ref Range Status   SARS Coronavirus 2 NEGATIVE NEGATIVE Final    Comment: (NOTE) If result is NEGATIVE SARS-CoV-2 target nucleic acids are NOT DETECTED. The SARS-CoV-2 RNA is generally detectable in upper and lower  respiratory specimens during the acute phase of infection. The lowest  concentration of SARS-CoV-2 viral copies this assay can detect is 250  copies / mL. A negative result does not preclude SARS-CoV-2 infection  and should not be used as the sole basis for treatment or other  patient management decisions.  A negative result may occur with  improper specimen collection / handling, submission of specimen other  than nasopharyngeal swab, presence of viral mutation(s) within the  areas targeted by this assay, and inadequate number of viral copies  (<250 copies / mL). A negative result must be combined with clinical  observations, patient history, and epidemiological information. If result is POSITIVE SARS-CoV-2 target nucleic acids are DETECTED. The SARS-CoV-2 RNA is generally detectable in upper and lower  respiratory specimens dur ing the acute phase of infection.  Positive  results are indicative of active infection with SARS-CoV-2.  Clinical  correlation with patient history and other diagnostic information is  necessary to determine patient infection status.  Positive results do  not rule out bacterial infection or co-infection with other viruses. If result is PRESUMPTIVE POSTIVE SARS-CoV-2 nucleic acids MAY BE PRESENT.     A presumptive positive result was obtained on the submitted specimen  and confirmed on repeat testing.  While 2019 novel coronavirus  (SARS-CoV-2) nucleic acids may be present in the submitted sample  additional confirmatory testing may be necessary for epidemiological  and / or clinical management purposes  to differentiate between  SARS-CoV-2 and other Sarbecovirus currently known to infect humans.  If clinically indicated additional testing with an alternate test  methodology [phone]) is advised. The SARS-CoV-2 RNA is generally  detectable in upper and lower respiratory sp ecimens during the acute  phase of infection. The expected result is Negative. Fact Sheet for Patients:  StrictlyIdeas.no Fact Sheet for Healthcare Providers: BankingDealers.co.za This test is not yet approved or cleared by the [place] FDA and has been authorized for detection and/or diagnosis of SARS-CoV-2 by FDA under an Emergency Use Authorization (EUA).  This EUA will remain in effect (meaning this test can be used) for the duration of the COVID-19 declaration under Section 564(b)(1) of the Act, 21 U.S.C. section 360bbb-3(b)(1), unless the authorization is terminated or revoked sooner. Performed at [facility], [place] [address] [place], [place] [address]      Radiological Exams on Admission: Dg Chest Portable 1 View  Result Date: 05/25/2019 CLINICAL DATA:  Hypotension. EXAM: PORTABLE CHEST 1 VIEW COMPARISON:  May 16, 2019 FINDINGS: There is a well-positioned tunneled dialysis catheter. The heart size is enlarged but stable from prior study. There is no pneumothorax. The lung volumes are somewhat low. There is mild vascular congestion without overt pulmonary edema. There may be atelectasis at the lung bases. IMPRESSION: No active disease. Electronically Signed   By: Constance Holster M.D.   On: 05/25/2019 16:47    EKG: Independently reviewed.   Possible A. fib with ST depression in the lateral leads.  CT parents are comparable to old EKG.  Assessment/Plan Principal Problem:   Acute GI bleeding Active Problems:   Chronic renal failure in pediatric patient, stage 4 (severe) (HCC)   Diabetes mellitus type 2 with complications ([place])   Atrial fibrillation ([place])   Pulmonary hypertension ([place])   ESRD (end stage renal disease) ([place])    1. Acute blood loss anemia likely from GI bleed -patient was just discharged last week during which patient had EGD done showed gastritis and duodenitis and with no active source of bleed seen.  Patient is started on Protonix and 2 units of PRBC transfusion has been ordered.  Even GI has been consulted. 2. Non-ST elevation MI -in the setting of hypotension and anemia.  Discussed with cardiologist.  Advised managing GI bleed at this time.  Unless patient has active chest pain no cardiac intervention at this time.  Holding off beta-blockers and aspirin due to GI bleed and hypotension. 3. History of A. fib presently not on anticoagulation secondary to GI bleed.  A. fib was regular during last admission. 4. Diabetes mellitus type 2 we will recheck CBGs every 4. 5. ESRD on hemodialysis on Tuesday Thursdays and Saturday.  Consult nephrology. 6. History of CHF volume management per nephrology.  Given the complexity including hypertension active bleed anemia and with other comorbidities including ESRD patient will need inpatient status.   DVT prophylaxis: SCDs. Code Status: Full code. Family Communication: We will need to discuss with patient's family. Disposition Plan: Home. Consults called: Gastroenterology and cardiology. Admission status: Inpatient.   Rise Patience MD [facility] Pager [phone].  If 7PM-7AM, please contact night-coverage [email or web address] Password [email or web address]  05/25/2019, 9:01 PM

## 2019-05-25 NOTE — Telephone Encounter (Signed)
Please advise 

## 2019-05-25 NOTE — Telephone Encounter (Signed)
Duplicate, already handled in another encounter.   Copied from [place] [phone]. Topic: Conservator, museum/gallery Patient (Clinic Use ONLY) >> May 10, 2019  2:08 PM Pauline Good wrote: Reason for CRM: pt returning call to nurse >> May 10, 2019  2:25 PM Damita Dunnings, CMA wrote: Did you try calling Pt? Our office did not.

## 2019-05-25 NOTE — Telephone Encounter (Signed)
Spoke with Tillie Rung, they just got a call from the dialysis center, his hemoglobin has decreased and he was recommended to go to the ER.  Tillie Rung states that the patient is going.

## 2019-05-25 NOTE — Progress Notes (Signed)
Protonix requested from pharmacy at 2127.

## 2019-05-25 NOTE — ED Notes (Signed)
Per pt. Dialysis: Tuesday, Thursday, and Saturday.

## 2019-05-26 DIAGNOSIS — K922 Gastrointestinal hemorrhage, unspecified: Secondary | ICD-10-CM

## 2019-05-26 DIAGNOSIS — I480 Paroxysmal atrial fibrillation: Secondary | ICD-10-CM

## 2019-05-26 DIAGNOSIS — N186 End stage renal disease: Secondary | ICD-10-CM

## 2019-05-26 LAB — GLUCOSE, CAPILLARY
Glucose-Capillary: 100 mg/dL — ABNORMAL HIGH (ref 70–99)
Glucose-Capillary: 105 mg/dL — ABNORMAL HIGH (ref 70–99)
Glucose-Capillary: 121 mg/dL — ABNORMAL HIGH (ref 70–99)
Glucose-Capillary: 133 mg/dL — ABNORMAL HIGH (ref 70–99)
Glucose-Capillary: 154 mg/dL — ABNORMAL HIGH (ref 70–99)
Glucose-Capillary: 92 mg/dL (ref 70–99)

## 2019-05-26 LAB — HEMOGLOBIN AND HEMATOCRIT, BLOOD
HCT: 19 % — ABNORMAL LOW (ref 39.0–52.0)
HCT: 23.6 % — ABNORMAL LOW (ref 39.0–52.0)
HCT: 23.2 % — ABNORMAL LOW (ref 39.0–52.0)
Hemoglobin: 6.2 g/dL — CL (ref 13.0–17.0)
Hemoglobin: 7.6 g/dL — ABNORMAL LOW (ref 13.0–17.0)
Hemoglobin: 7.5 g/dL — ABNORMAL LOW (ref 13.0–17.0)

## 2019-05-26 LAB — TROPONIN I (HIGH SENSITIVITY)
Troponin I (High Sensitivity): 4342 ng/L (ref <18)
Troponin I (High Sensitivity): 5401 ng/L (ref <18)

## 2019-05-26 LAB — PREPARE RBC (CROSSMATCH)

## 2019-05-26 MED ORDER — HEPARIN SODIUM (PORCINE) 1000 UNIT/ML IJ SOLN
INTRAMUSCULAR | Status: AC
  Filled 2019-05-26: qty 4

## 2019-05-26 MED ORDER — HEPARIN SODIUM (PORCINE) 1000 UNIT/ML IJ SOLN
3.4000 mL | Freq: Once | INTRAMUSCULAR | Status: AC
  Administered 2019-05-26: 3400 [IU] via INTRAVENOUS

## 2019-05-26 MED ORDER — SODIUM CHLORIDE 0.9 % IV SOLN: INTRAVENOUS | Status: DC

## 2019-05-26 MED ORDER — SODIUM CHLORIDE 0.9% IV SOLUTION
Freq: Once | INTRAVENOUS | Status: AC
  Administered 2019-05-26: 02:00:00 via INTRAVENOUS

## 2019-05-26 MED ORDER — DOXERCALCIFEROL 4 MCG/2ML IV SOLN
1.0000 ug | INTRAVENOUS | Status: DC
  Administered 2019-05-29 – 2019-05-31 (×2): 1 ug via INTRAVENOUS

## 2019-05-26 MED ORDER — SODIUM CHLORIDE 0.9 % IV SOLN
125.0000 mg | INTRAVENOUS | Status: DC
  Administered 2019-05-26: 125 mg via INTRAVENOUS
  Filled 2019-05-26 (×2): qty 10

## 2019-05-26 MED ORDER — DARBEPOETIN ALFA 60 MCG/0.3ML IJ SOSY: 60.0000 ug | PREFILLED_SYRINGE | INTRAMUSCULAR | Status: DC

## 2019-05-26 NOTE — Progress Notes (Signed)
Pt ECG HR in 80s, physical pulse at same rate when pt is awake. When pt asleep his physical pulse drops to 40s while ECG HR still remain es in 80s. Heart rhythm irregular, EKG completed in ED showed A-fib. No change in mental status, pt still alert and oriented x4. Denied chest pain, SOB and dizziness.  Blount, NP aware.

## 2019-05-26 NOTE — Progress Notes (Signed)
CRITICAL VALUE ALERT  Critical Value:  Troponin 4, 342  Date & Time Notied:  05/26/19 at 0134  Provider Notified: Kennon Holter, NP  Orders Received/Actions taken: Spoken to [facility], NP, no new orders at this time.

## 2019-05-26 NOTE — Progress Notes (Signed)
Pt hemoglobin 6.2, 1 unit RBCs transfused. Blount, NP notified.

## 2019-05-26 NOTE — Consult Note (Signed)
Subjective:   HPI  The patient is a 73 year old male who we are seeing in consultation today for melena and anemia. He was recently in the hospital year with melena and anemia and underwent EGD on June 28 with findings of gastritis and duodenitis. His hemoglobin and hematocrit on July 3 were 7.9 and 24. His hemoglobin yesterday on admission was 6.2 and 19.8. Patient states that he has continued to have melena. He does have a history of a duodenal ulcer a year ago. He states he had an EGD and a colonoscopy a year ago in [place]. This was done because of anemia. The colonoscopy according to him showed some small polyps. He was told he had an ulcer on the EGD. According to the patient his doctor in all I told him that there was nothing specifically that caused his anemia and that the only thing left to do would be a capsule study.  Review of Systems Currently no chest pain. Recent MI reported.  Past Medical History:  Diagnosis Date  . Anemia   . Anesthesia complication    General anesthesia makes me feel foggy for long periods of time  . Arthritis   . CKD (chronic kidney disease) stage V requiring chronic dialysis ([place])    Stage 4-5  . Complication of anesthesia    slow to come out of anesthesia - a lot of confusion   . Diabetes ([place])   . Fournier gangrene   . GI bleed   . Heart murmur    Born with, no problems  . History of blood transfusion    x2  . Hyperlipidemia   . Hypertension   . Moderate aortic stenosis   . NSTEMI (non-ST elevated myocardial infarction) ([place])   . Obesity   . OSA on CPAP   . Osteomyelitis ([place]) 2016   L toe  . Pulmonary hypertension ([place])   . Renal atrophy, left   . Vitamin D deficiency    Past Surgical History:  Procedure Laterality Date  . AMPUTATION TOE Left 04/20/2019   Procedure: AMPUTATION LEFT SECOND AND THIRD TOES;  Surgeon: Newt Minion, MD;  Location: [facility];  Service: Orthopedics;  Laterality: Left;  . AV FISTULA PLACEMENT Left 05/16/2019   Procedure: ARTERIOVENOUS (AV) FISTULA CREATION;  Surgeon: Serafina Mitchell, MD;  Location: [facility];  Service: Vascular;  Laterality: Left;  . BIOPSY  05/13/2019   Procedure: BIOPSY;  Surgeon: Laurence Spates, MD;  Location: [facility];  Service: Endoscopy;;  . COLONOSCOPY    . COLOSTOMY  03/2015  . COLOSTOMY REVERSAL    . ESOPHAGOGASTRODUODENOSCOPY N/A 05/13/2019   Procedure: ESOPHAGOGASTRODUODENOSCOPY (EGD);  Surgeon: Laurence Spates, MD;  Location: [facility] ENDOSCOPY;  Service: Endoscopy;  Laterality: N/A;  . INSERTION OF DIALYSIS CATHETER Right 05/16/2019   Procedure: INSERTION OF DIALYSIS CATHETER;  Surgeon: Serafina Mitchell, MD;  Location: [facility];  Service: Vascular;  Laterality: Right;  . IR THORACENTESIS ASP PLEURAL SPACE W/IMG GUIDE  05/15/2019  . TOE AMPUTATION Left 2016   Left big toe amputated due to MRSA infection   Social History   Socioeconomic History  . Marital status: Married    Spouse name: Not on file  . Number of children: 2  . Years of education: Not on file  . Highest education level: Not on file  Occupational History  . Occupation: retired-owned a Environmental manager   . Occupation: retired- Theatre manager  Social Needs  . Financial resource strain: Not on file  . Food insecurity  Worry: Not on file    Inability: Not on file  . Transportation needs    Medical: Not on file    Non-medical: Not on file  Tobacco Use  . Smoking status: Former Research scientist (life sciences)  . Smokeless tobacco: Never Used  . Tobacco comment: quit 12/2018 (1 ppd)  Substance and Sexual Activity  . Alcohol use: Yes    Comment: social  . Drug use: Never  . Sexual activity: Not on file  Lifestyle  . Physical activity    Days per week: Not on file    Minutes per session: Not on file  . Stress: Not on file  Relationships  . Social Herbalist on phone: Not on file    Gets together: Not on file    Attends religious service: Not on file    Active member of club or organization: Not on file    Attends  meetings of clubs or organizations: Not on file    Relationship status: Not on file  . Intimate partner violence    Fear of current or ex partner: Not on file    Emotionally abused: Not on file    Physically abused: Not on file    Forced sexual activity: Not on file  Other Topics Concern  . Not on file  Social History Narrative   Patient and his wife moved from [place] on 02/14/2021 live with his daughter   family history includes CAD in an other family member; CAD (age of onset: 62) in his father.  Current Facility-Administered Medications:  .  acetaminophen (TYLENOL) tablet 650 mg, 650 mg, Oral, Q6H PRN **OR** acetaminophen (TYLENOL) suppository 650 mg, 650 mg, Rectal, Q6H PRN, Rise Patience, MD .  ondansetron (ZOFRAN) tablet 4 mg, 4 mg, Oral, Q6H PRN **OR** ondansetron (ZOFRAN) injection 4 mg, 4 mg, Intravenous, Q6H PRN, Rise Patience, MD .  pantoprazole (PROTONIX) 80 mg in sodium chloride 0.9 % 250 mL (0.32 mg/mL) infusion, 8 mg/hr, Intravenous, Continuous, Rise Patience, MD, Last Rate: 25 mL/hr at 05/26/19 0854, 8 mg/hr at 05/26/19 0854 .  [START ON 05/29/2019] pantoprazole (PROTONIX) injection 40 mg, 40 mg, Intravenous, Q12H, Rise Patience, MD No Known Allergies   Objective:     BP 124/66 (BP Location: Right Wrist)   Pulse 100   Temp 98.2 F (36.8 C) (Oral)   Resp 15   Ht 6\' 2"  (1.88 m)   Wt 129.4 kg   SpO2 98%   BMI 36.63 kg/m   No acute distress  Heart regular rhythm, systolic murmur heard  Lungs clear  Abdomen soft and nontender  Laboratory No components found for: D1    Assessment:     Melena  Anemia secondary to blood loss  Chronic renal failure on dialysis      Plan:     I discussed the situation with the patient. He did have a recent EGD which was reviewed. He had a colonoscopy last year. He was told back then the only thing left to do would be a capsule study. I talked about doing a repeat EGD and colonoscopy at this time  but since he had an EGD on June 28 I'm not sure that will add much. He is not inclined to do a colonoscopy again at this time but would agree to a capsule. We will therefore order a capsule endoscopy.

## 2019-05-26 NOTE — Progress Notes (Signed)
Hal Hope, MD aware of pt condition, HR and heart rhythm. EKG ordered and completed.  MD notified about results, copy placed on chart (second reading not transferred to epic/copy in chart). MD will consult cardiology, pt still asymptomatic. Will continue to monitor pt closely.

## 2019-05-26 NOTE — Progress Notes (Addendum)
Subjective:  Recent Discharge 05/18/19 with Non stemi, resp.failure ,vol overload ESRD (HD started ) ,( had 7/01 LUA AVF / pcath )  GI Bleed ( hgb 7.4 near dc) =EGD 6/28 = gastritis and duodenitis but no active bleed.Now admitted recurrent  GI bleed(melena  per pt )with symp anemia HGB 6.0 [facility] / Hgb 6.2 in Er . Currently no cos sp transfusion prbcs .   Objective Vital signs in last 24 hours: Vitals:   05/26/19 0500 05/26/19 0723 05/26/19 0855 05/26/19 1218  BP:  (!) 107/59 124/66 119/74  Pulse:   100 93  Resp:   15 15  Temp:  98.2 F (36.8 C)  98.2 F (36.8 C)  TempSrc:  Oral Oral Oral  SpO2:   98% 100%  Weight: 129.4 kg     Height:       Weight change:   Physical Exam: General: alert OX#, obese male , pleasant ,appropriate  , male  Heart: irreg, VR <100 ,2/6 sem , no rub, gallop Lungs: CTA bila non labored breathing  Abdomen: Obese, BS pos , soft , NT, ND Extremities:  No pedal  Edema  Dialysis Access: R IJ perm cath, LUA AVF  Pos bruit    OP HD = [facility]  TTS   4h 37min   Hep none 2/2.25 bath  123.5kg  R TDC/ LUA AVF  Hec 1 mcg q hd   Mircera 60  mcg q 2wks , due 7/21  hgb 6.0<7.4  Venofer 100mg  load  Last dose 7/11  Acess= R IJ Perm cath/ LUA AVF place 05/16/19  Problem/Plan: 1. Recurrent GI Bleed with symptomatic  Anemia= GI seeing / hgb 6.2 >7.2 after 3u prbcs  2. ESRD - HD TTS  Use  NO HEP  Hd today on schedule  K 3.6 yest  Reck lab pre hd now gicen PRBCs  3. Anemia  OF ESRD and GI bleed  With ho Iron Def=  ESA given 7/07 continue Venofer 100 mg today then weekly 50 mg  On hd  4. Secondary hyperparathyroidism - Iv vit d on hd  and binder as needed  5. HTN/volume - bp stable , vol stable on low dose Carvedilol  With ho recent nonstemi  Fu bp trend on hd  6. Recent Nonstemi- cardiology seeing , no current cp/sob  7. DM type 2- per admit   Ernest Haber, PA-C [place] [phone] 05/26/2019,12:54 PM  LOS: 1 day   Pt seen,  examined and agree w A/P as above.  Kelly Splinter  MD 05/26/2019, 3:19 PM  \   Labs: Basic Metabolic Panel: Recent Labs  Lab 05/25/19 1642  NA 138  K 3.6  CL 98  CO2 30  GLUCOSE 163*  BUN 46*  CREATININE 4.35*  CALCIUM 8.1*   Liver Function Tests: Recent Labs  Lab 05/25/19 1642  AST 20  ALT 29  ALKPHOS 51  BILITOT 0.4  PROT 5.7*  ALBUMIN 3.1*   Recent Labs  Lab 05/25/19 1642  LIPASE 34   Recent Labs  Lab 05/25/19 1644  AMMONIA 13   CBC: Recent Labs  Lab 05/25/19 1642 05/26/19 0023 05/26/19 0943  WBC 12.1*  --   --   NEUTROABS 10.0*  --   --   HGB 6.2* 6.2* 7.6*  HCT 19.8* 19.0* 23.6*  MCV 97.5  --   --   PLT 149*  --   --    Cardiac Enzymes: No results for  input(s): CKTOTAL, CKMB, CKMBINDEX, TROPONINI in the last 168 hours. CBG: Recent Labs  Lab 05/26/19 0023 05/26/19 0439 05/26/19 0843 05/26/19 1204  GLUCAP 92 105* 154* 133*    Studies/Results: Dg Chest Portable 1 View  Result Date: 05/25/2019 CLINICAL DATA:  Hypotension. EXAM: PORTABLE CHEST 1 VIEW COMPARISON:  May 16, 2019 FINDINGS: There is a well-positioned tunneled dialysis catheter. The heart size is enlarged but stable from prior study. There is no pneumothorax. The lung volumes are somewhat low. There is mild vascular congestion without overt pulmonary edema. There may be atelectasis at the lung bases. IMPRESSION: No active disease. Electronically Signed   By: Constance Holster M.D.   On: 05/25/2019 16:47   Medications: . pantoprozole (PROTONIX) infusion 8 mg/hr (05/26/19 0854)   . [START ON 05/29/2019] pantoprazole  40 mg Intravenous Q12H

## 2019-05-26 NOTE — Progress Notes (Signed)
CRITICAL VALUE ALERT  Critical Value:Troponin- 5,401  Date & Time Notied: 05/26/19 1148  Provider Notified: Pahwani  Orders Received/Actions taken:MD notified. Will continue to monitor.

## 2019-05-26 NOTE — Progress Notes (Signed)
PROGRESS NOTE    [name]  JKD:[id number] DOB: [birth year] DOA: 05/25/2019 PCP: Colon Branch, MD   Brief Narrative:  Louis Bradley is a 73 y.o. male with history of chronic kidney disease recently started on hemodialysis during last admission discharged on May 17, 2029 a week ago after being admitted for shortness of breath was found to have acute GI bleed after he was found to have gastritis and duodenitis but no active bleed to EGD that was done on 6/28.  During that hospitalization patient also underwent dialysis catheter placement and dialysis was started had gone for his routine dialysis on Thursday, 05/24/2019 and had labs done which showed a low hemoglobin of 6.2 and was advised to come to the ER.  Patient denied any chest pain shortness of breath nausea vomiting abdominal pain or diarrhea.  During last admission patient also had non-ST elevation MI and was at this time conservatively managed due to patient's acute GI bleed.  In the ER patient was hypotensive with blood pressure in the 09X systolic was given 1 L fluid bolus.  Hemoglobin was 6.2 on-call [facility] gastroenterologist was consulted patient started on Protonix and 3 units of PRBC transfusion ordered.  EKG shows possible A. fib with ST depression in the lateral leads which are comparable to the old EKG. patient did not have any signs or symptoms of ACS.  He had elevated troponin, more than previous 1 so cardiology was consulted by admitting physician however hospitalist was told to manage and treat patient's GI bleed and that nothing can be done or should be done unless patient has ACS symptoms.  Consultants:   Nephrology  GI  Cardiology  Procedures:   None  Antimicrobials:   None   Subjective: Patient seen and examined.  He has no complaints.  Denies any abdominal pain, nausea or vomiting.  Had one bowel movement this morning which was black.  He requested to give a call to his wife as soon as possible because she  worries too much for him.  Objective: Vitals:   05/26/19 0456 05/26/19 0500 05/26/19 0723 05/26/19 0855  BP: (!) 120/59  (!) 107/59 124/66  Pulse:    100  Resp:    15  Temp: 98 F (36.7 C)  98.2 F (36.8 C)   TempSrc: Oral  Oral Oral  SpO2:    98%  Weight:  129.4 kg    Height:        Intake/Output Summary (Last 24 hours) at 05/26/2019 0911 Last data filed at 05/26/2019 0723 Gross per 24 hour  Intake 2092.94 ml  Output -  Net 2092.94 ml   Filed Weights   05/25/19 1612 05/26/19 0500  Weight: 131.5 kg 129.4 kg    Examination:  General exam: Appears calm and comfortable  Respiratory system: Clear to auscultation. Respiratory effort normal. Cardiovascular system: S1 & S2 heard, RRR. No JVD, murmurs, rubs, gallops or clicks. No pedal edema. Gastrointestinal system: Abdomen is nondistended, soft and nontender. No organomegaly or masses felt. Normal bowel sounds heard. Central nervous system: Alert and oriented. No focal neurological deficits. Extremities: Symmetric 5 x 5 power. Skin: No rashes, lesions or ulcers Psychiatry: Judgement and insight appear normal. Mood & affect appropriate.    Data Reviewed: I have personally reviewed following labs and imaging studies  CBC: Recent Labs  Lab 05/25/19 1642 05/26/19 0023  WBC 12.1*  --   NEUTROABS 10.0*  --   HGB 6.2* 6.2*  HCT 19.8* 19.0*  MCV 97.5  --   PLT 149*  --    Basic Metabolic Panel: Recent Labs  Lab 05/25/19 1642  NA 138  K 3.6  CL 98  CO2 30  GLUCOSE 163*  BUN 46*  CREATININE 4.35*  CALCIUM 8.1*   GFR: Estimated Creatinine Clearance: 21.6 mL/min (A) (by C-G formula based on SCr of 4.35 mg/dL (H)). Liver Function Tests: Recent Labs  Lab 05/25/19 1642  AST 20  ALT 29  ALKPHOS 51  BILITOT 0.4  PROT 5.7*  ALBUMIN 3.1*   Recent Labs  Lab 05/25/19 1642  LIPASE 34   Recent Labs  Lab 05/25/19 1644  AMMONIA 13   Coagulation Profile: No results for input(s): INR, PROTIME in the last 168  hours. Cardiac Enzymes: No results for input(s): CKTOTAL, CKMB, CKMBINDEX, TROPONINI in the last 168 hours. BNP (last 3 results) No results for input(s): PROBNP in the last 8760 hours. HbA1C: No results for input(s): HGBA1C in the last 72 hours. CBG: Recent Labs  Lab 05/26/19 0023 05/26/19 0439 05/26/19 0843  GLUCAP 92 105* 154*   Lipid Profile: No results for input(s): CHOL, HDL, LDLCALC, TRIG, CHOLHDL, LDLDIRECT in the last 72 hours. Thyroid Function Tests: No results for input(s): TSH, T4TOTAL, FREET4, T3FREE, THYROIDAB in the last 72 hours. Anemia Panel: No results for input(s): VITAMINB12, FOLATE, FERRITIN, TIBC, IRON, RETICCTPCT in the last 72 hours. Sepsis Labs: Recent Labs  Lab 05/25/19 1644  LATICACIDVEN 1.3    Recent Results (from the past 240 hour(s))  Surgical pcr screen     Status: None   Collection Time: 05/16/19 10:31 AM   Specimen: Nasal Mucosa; Nasal Swab  Result Value Ref Range Status   MRSA, PCR NEGATIVE NEGATIVE Final   Staphylococcus aureus NEGATIVE NEGATIVE Final    Comment: (NOTE) The Xpert SA Assay (FDA approved for NASAL specimens in patients 64 years of age and older), is one component of a comprehensive surveillance program. It is not intended to diagnose infection nor to guide or monitor treatment. Performed at [facility], [place] [address], [place], [place] [address]   SARS Coronavirus 2 (CEPHEID - Performed in [facility] hospital lab), Hosp Order     Status: None   Collection Time: 05/25/19  7:11 PM   Specimen: Nasopharyngeal Swab  Result Value Ref Range Status   SARS Coronavirus 2 NEGATIVE NEGATIVE Final    Comment: (NOTE) If result is NEGATIVE SARS-CoV-2 target nucleic acids are NOT DETECTED. The SARS-CoV-2 RNA is generally detectable in upper and lower  respiratory specimens during the acute phase of infection. The lowest  concentration of SARS-CoV-2 viral copies this assay can detect is 250  copies / mL. A negative result  does not preclude SARS-CoV-2 infection  and should not be used as the sole basis for treatment or other  patient management decisions.  A negative result may occur with  improper specimen collection / handling, submission of specimen other  than nasopharyngeal swab, presence of viral mutation(s) within the  areas targeted by this assay, and inadequate number of viral copies  (<250 copies / mL). A negative result must be combined with clinical  observations, patient history, and epidemiological information. If result is POSITIVE SARS-CoV-2 target nucleic acids are DETECTED. The SARS-CoV-2 RNA is generally detectable in upper and lower  respiratory specimens dur ing the acute phase of infection.  Positive  results are indicative of active infection with SARS-CoV-2.  Clinical  correlation with patient history and other diagnostic information is  necessary to determine patient infection status.  Positive results do  not rule out bacterial infection or co-infection with other viruses. If result is PRESUMPTIVE POSTIVE SARS-CoV-2 nucleic acids MAY BE PRESENT.   A presumptive positive result was obtained on the submitted specimen  and confirmed on repeat testing.  While 2019 novel coronavirus  (SARS-CoV-2) nucleic acids may be present in the submitted sample  additional confirmatory testing may be necessary for epidemiological  and / or clinical management purposes  to differentiate between  SARS-CoV-2 and other Sarbecovirus currently known to infect humans.  If clinically indicated additional testing with an alternate test  methodology [phone]) is advised. The SARS-CoV-2 RNA is generally  detectable in upper and lower respiratory sp ecimens during the acute  phase of infection. The expected result is Negative. Fact Sheet for Patients:  StrictlyIdeas.no Fact Sheet for Healthcare Providers: BankingDealers.co.za This test is not yet approved or  cleared by the [place] FDA and has been authorized for detection and/or diagnosis of SARS-CoV-2 by FDA under an Emergency Use Authorization (EUA).  This EUA will remain in effect (meaning this test can be used) for the duration of the COVID-19 declaration under Section 564(b)(1) of the Act, 21 U.S.C. section 360bbb-3(b)(1), unless the authorization is terminated or revoked sooner. Performed at [facility], [place] [address] [place], [place] [address]       Radiology Studies: Dg Chest Portable 1 View  Result Date: 05/25/2019 CLINICAL DATA:  Hypotension. EXAM: PORTABLE CHEST 1 VIEW COMPARISON:  May 16, 2019 FINDINGS: There is a well-positioned tunneled dialysis catheter. The heart size is enlarged but stable from prior study. There is no pneumothorax. The lung volumes are somewhat low. There is mild vascular congestion without overt pulmonary edema. There may be atelectasis at the lung bases. IMPRESSION: No active disease. Electronically Signed   By: Constance Holster M.D.   On: 05/25/2019 16:47    Scheduled Meds: . [START ON 05/29/2019] pantoprazole  40 mg Intravenous Q12H   Continuous Infusions: . pantoprozole (PROTONIX) infusion 8 mg/hr (05/26/19 0854)     LOS: 1 day   Assessment & Plan:   Principal Problem:   Acute GI bleeding Active Problems:   Chronic renal failure in pediatric patient, stage 4 (severe) (HCC)   Diabetes mellitus type 2 with complications ([place])   Atrial fibrillation ([place])   Pulmonary hypertension ([place])   ESRD (end stage renal disease) ([place])   Symptomatic anemia/acute blood loss anemia/possible upper GI bleed/hypotension: Patient has received total of 3 units of PRBC transfusion so far.  His latest hemoglobin checked at around 5 in the morning again shows hemoglobin of 6.2 and that was checked after he received 2 units.  I am going to check his hemoglobin again and if it is less than 7 I will transfuse more.  GI has been consulted and I guess he  will require another EGD now that he is having such a drop in hemoglobin.  I will monitor his hemoglobin every 8 hours and continue IV Protonix.  His hypotension was likely due to acute blood loss anemia.  This has improved.  Continue gentle hydration.  Recent non-ST elevation MI/ST depression on EKG/elevated troponin: He has no signs or symptoms of chest pain or shortness of breath.  This is likely type II MI.  Continue to monitor.  No aspirin or Plavix due to acute active GI bleed.  CKD stage V on hemodialysis: Consulted nephrology.  Type 2 diabetes mellitus: Hold oral hypoglycemics.  Start  on SSI.  OSA on CPAP: Nightly CPAP.  Atrial fibrillation with RVR: Talking to patient, he does not recall that he was told that he had atrial fibrillation.  Currently he has atrial fibrillation with intermittent RVR and controlled rate.  I did explain to patient that normally patients with atrial fibrillation and chads vascular score of more than 2 were supposed to be on anticoagulation however due to active GI bleed, he is not a candidate for that which unfortunately puts him at risk of having blood clots and stroke and several other complications.  He verbalized understanding and had no further questions.  Carvedilol on hold due to hypotension.  Monitor on telemetry.  He was not on any anticoagulation for recent GI bleed.  DVT prophylaxis: SCD/avoiding heparin products due to active GI bleed. Code Status: Full code Family Communication: Called patient's wife.  She had several questions and most of those questions she asked me twice which I answered to the best of my ability at least twice.  She had several more questions which I believed were more appropriate for nephrology and GI.  She wants everything to be done for the patient, she wants full work-up including EGD and colonoscopy and CT scan of all body to find out where the GI bleed is coming from.  She also wants to talk to GI and nephrology to answer more  questions.  Disposition Plan: To be determined   Time spent: 48 minutes, approximately 15 minutes of those were spent on the phone with patient's wife answering the questions repeatedly and risks were explained in reviewing patient's chart, patient encounter documentation.   Darliss Cheney, MD [facility] Pager [phone]  If 7PM-7AM, please contact night-coverage [email or web address] Password [facility] 05/26/2019, 9:11 AM

## 2019-05-26 NOTE — Progress Notes (Signed)
Late note: RT placed pt on CPAP dream station for the night on home setting of 14 cmH2O with no O2 bled into the system. Pt tolerating well. Pts respiratory status is stable at this time on CPAP. RT will continue to monitor.

## 2019-05-26 NOTE — Consult Note (Addendum)
Cardiology Consultation:   Patient ID: Louis Bradley MRN: [id number]; DOB: 03-30-1946  Admit date: 05/25/2019 Date of Consult: 05/26/2019  Primary Care Provider: Colon Branch, MD Primary Cardiologist: Candee Furbish, MD  Primary Electrophysiologist:  None    Patient Profile:   Louis Bradley is a 73 y.o. male with a hx of non-ST elevation myocardial infarction, possible demand ischemia, GI bleed, atrial fibrillation who is being seen today for the evaluation of atrial fibrillation at the request of Dr. Doristine Bosworth.  History of Present Illness:   Louis Bradley is a 73 year old male with recent new start hemodialysis with recurrent GI bleed requiring transfusion, most recently 05/24/2019 labs at dialysis on Thursday showed a hemoglobin of 6.2.  No chest pain, no shortness of breath.  Blood pressure was once again hypotensive in the 29J systolic.  Fluid bolus was administered.  3 units of blood was administered.  [facility] seeing patient.  EKG demonstrates underlying coarse atrial fibrillation with ST segment depression in the lateral leads that is comparable to previous EKG.  Hemoglobin is being closely monitored by team.  IV Protonix administered. Agree that troponin elevation was demand ischemia/type II MI.  In addition to his other issues, atrial fibrillation is newly diagnosed.  Patient's wife was called by Dr. Doristine Bosworth.  He answered several questions.  Several persistent questions were asked once again.  GI to assist with answers.   Heart Pathway Score:     Past Medical History:  Diagnosis Date   Anemia    Anesthesia complication    General anesthesia makes me feel foggy for long periods of time   Arthritis    CKD (chronic kidney disease) stage V requiring chronic dialysis ([place])    Stage 4-5   Complication of anesthesia    slow to come out of anesthesia - a lot of confusion    Diabetes ([place])    Fournier gangrene    GI bleed    Heart murmur    Born with, no problems    History of blood transfusion    x2   Hyperlipidemia    Hypertension    Moderate aortic stenosis    NSTEMI (non-ST elevated myocardial infarction) ([place])    Obesity    OSA on CPAP    Osteomyelitis ([place]) 2016   L toe   Pulmonary hypertension ([place])    Renal atrophy, left    Vitamin D deficiency     Past Surgical History:  Procedure Laterality Date   AMPUTATION TOE Left 04/20/2019   Procedure: AMPUTATION LEFT SECOND AND THIRD TOES;  Surgeon: Newt Minion, MD;  Location: [place];  Service: Orthopedics;  Laterality: Left;   AV FISTULA PLACEMENT Left 05/16/2019   Procedure: ARTERIOVENOUS (AV) FISTULA CREATION;  Surgeon: Serafina Mitchell, MD;  Location: [place];  Service: Vascular;  Laterality: Left;   BIOPSY  05/13/2019   Procedure: BIOPSY;  Surgeon: Laurence Spates, MD;  Location: [place];  Service: Endoscopy;;   COLONOSCOPY     COLOSTOMY  03/2015   COLOSTOMY REVERSAL     ESOPHAGOGASTRODUODENOSCOPY N/A 05/13/2019   Procedure: ESOPHAGOGASTRODUODENOSCOPY (EGD);  Surgeon: Laurence Spates, MD;  Location[facility] ENDOSCOPY;  Service: Endoscopy;  Laterality: N/A;   INSERTION OF DIALYSIS CATHETER Right 05/16/2019   Procedure: INSERTION OF DIALYSIS CATHETER;  Surgeon: Serafina Mitchell, MD;  Location: [place];  Service: Vascular;  Laterality: Right;   IR THORACENTESIS ASP PLEURAL SPACE W/IMG GUIDE  05/15/2019   TOE AMPUTATION Left 2016   Left big  toe amputated due to MRSA infection     Home Medications:  Prior to Admission medications   Medication Sig Start Date End Date Taking? Authorizing Provider  calcitRIOL (ROCALTROL) 0.25 MCG capsule Take 0.25 mcg by mouth every Monday, Wednesday, and Friday.  03/22/19  Yes [provider]  calcium acetate (PHOSLO) 667 MG capsule Take 1 capsule (667 mg total) by mouth 3 (three) times daily with meals. 05/18/19  Yes Shelly Coss, MD  carvedilol (COREG) 3.125 MG tablet Take 1 tablet (3.125 mg total) by mouth 2 (two) times daily with a meal.  05/18/19  Yes Adhikari, Amrit, MD  glipiZIDE (GLUCOTROL) 5 MG tablet Take 2 tablets (10 mg total) by mouth 2 (two) times daily. 04/20/19  Yes Paz, Alda Berthold, MD  nicotine polacrilex (COMMIT) 4 MG lozenge Take 4 mg by mouth as needed for smoking cessation.   Yes [provider]  pantoprazole (PROTONIX) 40 MG tablet Take 1 tablet (40 mg total) by mouth 2 (two) times daily. 05/11/19  Yes Allie Bossier, MD  pravastatin (PRAVACHOL) 40 MG tablet Take 1 tablet (40 mg total) by mouth at bedtime. 03/13/19  Yes Colon Branch, MD  Probiotic Product (PROBIOTIC DAILY PO) Take 1 tablet by mouth every Monday, Wednesday, and Friday.    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[employer], 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 strain: Not on file   Food insecurity    Worry: Not on file    Inability: Not on file   Transportation needs    Medical: Not on file    Non-medical: Not on file  Tobacco Use   Smoking status: Former Smoker   Smokeless tobacco: Never Used   Tobacco comment: quit 12/2018 (1 ppd)  Substance and Sexual Activity   Alcohol use: Yes    Comment: social   Drug use: Never   Sexual activity: Not on file  Lifestyle   Physical activity    Days per week: Not on file    Minutes per session: Not on file   Stress: Not on file  Relationships   Social connections    Talks on  phone: Not on file    Gets together: Not on file    Attends religious service: Not on file    Active member of club or organization: Not on file    Attends meetings of clubs or organizations: Not on file    Relationship status: Not on file   Intimate partner violence    Fear of current or ex partner: Not on file    Emotionally abused: Not on file    Physically abused: Not on file    Forced sexual activity: Not on file  Other Topics Concern   Not on file  Social History Narrative   Patient and his wife moved from [place] on 02/14/2021 live with his daughter  Family History:    Family History  Problem Relation Age of Onset   CAD Father 94   CAD Other        2 uncles had heart attacks   Colon cancer Neg Hx    Prostate cancer Neg Hx      ROS:  Please see the history of present illness.  No fevers chills nausea vomiting syncope bleeding All other ROS reviewed and negative.     Physical Exam/Data:   Vitals:   05/26/19 0456 05/26/19 0500 05/26/19 0723 05/26/19 0855  BP: (!) 120/59  (!) 107/59 124/66  Pulse:    100  Resp:    15  Temp: 98 F (36.7 C)  98.2 F (36.8 C)   TempSrc: Oral  Oral Oral  SpO2:    98%  Weight:  129.4 kg    Height:        Intake/Output Summary (Last 24 hours) at 05/26/2019 0945 Last data filed at 05/26/2019 0723 Gross per 24 hour  Intake 2092.94 ml  Output --  Net 2092.94 ml   Last 3 Weights 05/26/2019 05/25/2019 05/18/2019  Weight (lbs) 285 lb 4.4 oz 290 lb 269 lb 6.4 oz  Weight (kg) 129.4 kg 131.543 kg 122.2 kg     Body mass index is 36.63 kg/m.  General:  Well nourished, well developed, in no acute distress HEENT: normal Lymph: no adenopathy Neck: no JVD Endocrine:  No thryomegaly Vascular: No carotid bruits; FA pulses 2+ bilaterally without bruits  Cardiac:  normal S1, S2; slightly irregular; 3/6 systolic murmur  Lungs:  clear to auscultation bilaterally, no wheezing, rhonchi or rales  Abd: soft, nontender, no hepatomegaly  Ext: no  edema Musculoskeletal:  No deformities, BUE and BLE strength normal and equal Skin: warm and dry  Neuro:  CNs 2-12 intact, no focal abnormalities noted Psych:  Normal affect   EKG:  The EKG was personally reviewed and demonstrates: Atrial flutter heart rate 90 with ST segment depression laterally  Telemetry:  Telemetry was personally reviewed and demonstrates: Atrial flutter rate controlled  Relevant CV Studies: Echo EF 50%, moderate aortic stenosis.  Laboratory Data:  High Sensitivity Troponin:   Recent Labs  Lab 05/10/19 1924 05/10/19 2332 05/12/19 1434 05/26/19 0023  TROPONINIHS 1,542* 2,052* 2,283* 4,342*     Cardiac EnzymesNo results for input(s): TROPONINI in the last 168 hours. No results for input(s): TROPIPOC in the last 168 hours.  Chemistry Recent Labs  Lab 05/25/19 1642  NA 138  K 3.6  CL 98  CO2 30  GLUCOSE 163*  BUN 46*  CREATININE 4.35*  CALCIUM 8.1*  GFRNONAA 13*  GFRAA 15*  ANIONGAP 10    Recent Labs  Lab 05/25/19 1642  PROT 5.7*  ALBUMIN 3.1*  AST 20  ALT 29  ALKPHOS 51  BILITOT 0.4   Hematology Recent Labs  Lab 05/25/19 1642 05/26/19 0023  WBC 12.1*  --   RBC 2.03*  --   HGB 6.2* 6.2*  HCT 19.8* 19.0*  MCV 97.5  --   MCH 30.5  --   MCHC 31.3  --   RDW 17.1*  --   PLT 149*  --    BNPNo results for input(s): BNP, PROBNP in the last 168 hours.  DDimer No results for input(s): DDIMER in the last 168 hours.   Radiology/Studies:  Dg Chest Portable 1 View  Result Date: 05/25/2019 CLINICAL DATA:  Hypotension. EXAM: PORTABLE CHEST 1 VIEW COMPARISON:  May 16, 2019 FINDINGS: There is a  well-positioned tunneled dialysis catheter. The heart size is enlarged but stable from prior study. There is no pneumothorax. The lung volumes are somewhat low. There is mild vascular congestion without overt pulmonary edema. There may be atelectasis at the lung bases. IMPRESSION: No active disease. Electronically Signed   By: Constance Holster M.D.    On: 05/25/2019 16:47    Assessment and Plan:   Acute GI bleed -Drop in hemoglobin once again.  3 units of blood.  [facility].  Continued work-up pending.  IV Protonix per main team.  Elevated troponin-4000 -Type II MI/demand ischemia in the setting of his significant anemia.  EKG demonstrates lateral ST segment depression.  This does portend a worsened prognosis. - Unable to undergo any invasive diagnostic evaluation given his ongoing bleeding issues.  Appreciate Dr. Antionette Char previous consultation at his last admission who stated that he needs to demonstrate stability with regards to his hemoglobin prior to diagnostic heart catheterization.  He would not tolerate antiplatelet medications at this time.  Paroxysmal atrial fibrillation - Given his GI bleed, unable to anticoagulate. -Good rate control currently.  He is currently on low-dose carvedilol 3.125 twice a day.  May have to hold if hypotensive. -EF 50%  End-stage renal disease with new start hemodialysis several days ago - With his recurrent GI bleeds and new start hemodialysis as well as cardiac issues, he is at high risk for further morbidity and potential mortality.  Morbid obesity - BMI greater than 35 with multiple comorbidities.  Continue to encourage weight loss.  Wife would like a call with updates. Wife expressed concerns to me. Worried about consist care. Spent 56min with her.     For questions or updates, please contact [place] Please consult [email or web address] for contact info under     Signed, Candee Furbish, MD  05/26/2019 9:45 AM

## 2019-05-27 ENCOUNTER — Encounter (HOSPITAL_COMMUNITY)
Admission: EM | Disposition: A | Discharge status: Home Health | Payer: Self-pay | Source: Home / Self Care | Attending: Family Medicine

## 2019-05-27 DIAGNOSIS — I4819 Other persistent atrial fibrillation: Secondary | ICD-10-CM

## 2019-05-27 HISTORY — PX: GIVENS CAPSULE STUDY: SHX5432

## 2019-05-27 LAB — GLUCOSE, CAPILLARY
Glucose-Capillary: 115 mg/dL — ABNORMAL HIGH (ref 70–99)
Glucose-Capillary: 124 mg/dL — ABNORMAL HIGH (ref 70–99)
Glucose-Capillary: 131 mg/dL — ABNORMAL HIGH (ref 70–99)
Glucose-Capillary: 161 mg/dL — ABNORMAL HIGH (ref 70–99)
Glucose-Capillary: 138 mg/dL — ABNORMAL HIGH (ref 70–99)

## 2019-05-27 LAB — BASIC METABOLIC PANEL
Anion gap: 9 (ref 5–15)
BUN: 41 mg/dL — ABNORMAL HIGH (ref 8–23)
CO2: 26 mmol/L (ref 22–32)
Calcium: 8.1 mg/dL — ABNORMAL LOW (ref 8.9–10.3)
Chloride: 103 mmol/L (ref 98–111)
Creatinine, Ser: 3.8 mg/dL — ABNORMAL HIGH (ref 0.61–1.24)
GFR calc Af Amer: 17 mL/min — ABNORMAL LOW (ref >60)
GFR calc non Af Amer: 15 mL/min — ABNORMAL LOW (ref >60)
Glucose, Bld: 136 mg/dL — ABNORMAL HIGH (ref 70–99)
Potassium: 3.9 mmol/L (ref 3.5–5.1)
Sodium: 138 mmol/L (ref 135–145)

## 2019-05-27 LAB — HEMOGLOBIN AND HEMATOCRIT, BLOOD
HCT: 24.8 % — ABNORMAL LOW (ref 39.0–52.0)
Hemoglobin: 7.8 g/dL — ABNORMAL LOW (ref 13.0–17.0)

## 2019-05-27 LAB — CBC
HCT: 23.1 % — ABNORMAL LOW (ref 39.0–52.0)
Hemoglobin: 7.2 g/dL — ABNORMAL LOW (ref 13.0–17.0)
MCH: 29.6 pg (ref 26.0–34.0)
MCHC: 31.2 g/dL (ref 30.0–36.0)
MCV: 95.1 fL (ref 80.0–100.0)
Platelets: 139 10*3/uL — ABNORMAL LOW (ref 150–400)
RBC: 2.43 MIL/uL — ABNORMAL LOW (ref 4.22–5.81)
RDW: 17.6 % — ABNORMAL HIGH (ref 11.5–15.5)
WBC: 8.3 10*3/uL (ref 4.0–10.5)
nRBC: 0 % (ref 0.0–0.2)

## 2019-05-27 LAB — PREPARE RBC (CROSSMATCH)

## 2019-05-27 SURGERY — IMAGING PROCEDURE, GI TRACT, INTRALUMINAL, VIA CAPSULE
Anesthesia: LOCAL

## 2019-05-27 MED ORDER — SODIUM CHLORIDE 0.9% IV SOLUTION
Freq: Once | INTRAVENOUS | Status: AC
  Administered 2019-05-27: 14:00:00 via INTRAVENOUS

## 2019-05-27 MED ORDER — DARBEPOETIN ALFA 60 MCG/0.3ML IJ SOSY: 60.0000 ug | PREFILLED_SYRINGE | INTRAMUSCULAR | Status: DC

## 2019-05-27 MED ORDER — CARVEDILOL 3.125 MG PO TABS
3.1250 mg | ORAL_TABLET | Freq: Two times a day (BID) | ORAL | Status: DC
  Administered 2019-05-27 – 2019-06-01 (×9): 3.125 mg via ORAL
  Filled 2019-05-27 (×9): qty 1

## 2019-05-27 SURGICAL SUPPLY — 1 items: TOWEL COTTON PACK 4EA (MISCELLANEOUS) ×4 IMPLANT

## 2019-05-27 NOTE — Progress Notes (Signed)
GI Monitor leads removed and placed in bag next to pt belongings.

## 2019-05-27 NOTE — Progress Notes (Addendum)
Subjective:  Tolerated HD yesterday/ no cos this am  No cp or sob / noted swallowed capsule endoscopy this am   Objective Vital signs in last 24 hours: Vitals:   05/27/19 0600 05/27/19 0930 05/27/19 1330 05/27/19 1401  BP:  137/60 (!) 109/57 (!) 127/59  Pulse:  67 (!) 56 63  Resp:  19 19 20   Temp:   98 F (36.7 C) 98.2 F (36.8 C)  TempSrc:   Oral Oral  SpO2:  100% 100% 99%  Weight: 57.4 kg     Height:       Weight change: -4.543 kg  Physical Exam: General: alert OX3, obese male , pleasant ,appropriate  , male  Heart: irreg,irreg no M this am  , no rub, gallop Lungs: CTA bila non labored breathing  Abdomen: Obese, BS pos , soft , NT, ND Extremities:  No pedal  Edema  Dialysis Access: R IJ perm cath, LUA AVF  Pos bruit    OP HD = [facility]  TTS   4h 66min   Hep none 2/2.25 bath  123.5kg  R TDC/ LUA AVF  Hec 1 mcg q hd   Mircera 60  mcg q 2wks (last given 7/07)  hgb 6.0<7.4  Venofer 100mg  load  Last dose 7/11  Acess= R IJ Perm cath/ LUA AVF place 05/16/19  Problem/Plan: 1. Recurrent GI Bleed with symptomatic  Anemia= GI seeing / admit hgb 6.2 >7.2 after 3u prbcs  And 1 more  unit yest / this am hgb 7.2( 6/28=  TFS 9%  Ferritin 205  Will repeat Fe/ TIBC and give next esa a week early on Tues , may help recover from GIB 2. ESRD - HD TTS  Use  NO HEP  Hd today on schedule  K 3.6 yest  Reck lab pre hd now gicen PRBCs  3. Anemia  OF ESRD and GI bleed  With ho Iron Def=  Mircera given 7/07 continue Venofer 100 mg today then weekly 50 mg  On hd / Because of low hgb  Give dose Aranesp 60  next hd  4. Secondary hyperparathyroidism - Iv vit d on hd  and binder as needed  5. HTN/volume - bp stable , vol stable on cXR on admit NAD , post hd wt 126 (BUT BED WT) 2.5 kg >edw ( noted as op edw just incr not obtaining edw prior op hd with bp ok)   low dose Carvedilol  With ho recent nonstemi  Fu bp trend on hd  6. Recent Nonstemi- cardiology seeing , no current cp/sob  7. DM type 2- per admit    Ernest Haber, PA-C [place] [phone] 05/27/2019,2:30 PM  LOS: 2 days   Pt seen, examined and agree w assess/plan as above with additions as indicated.  [facility] 05/27/2019, 3:56 PM     Labs: Basic Metabolic Panel: Recent Labs  Lab 05/25/19 1642 05/27/19 0752  NA 138 138  K 3.6 3.9  CL 98 103  CO2 30 26  GLUCOSE 163* 136*  BUN 46* 41*  CREATININE 4.35* 3.80*  CALCIUM 8.1* 8.1*   Liver Function Tests: Recent Labs  Lab 05/25/19 1642  AST 20  ALT 29  ALKPHOS 51  BILITOT 0.4  PROT 5.7*  ALBUMIN 3.1*   Recent Labs  Lab 05/25/19 1642  LIPASE 34   Recent Labs  Lab 05/25/19 1644  AMMONIA 13   CBC: Recent Labs  Lab 05/25/19 1642  05/26/19 0943  05/26/19 1649 05/27/19 0752  WBC 12.1*  --   --   --  8.3  NEUTROABS 10.0*  --   --   --   --   HGB 6.2*   < > 7.6* 7.5* 7.2*  HCT 19.8*   < > 23.6* 23.2* 23.1*  MCV 97.5  --   --   --  95.1  PLT 149*  --   --   --  139*   < > = values in this interval not displayed.   Cardiac Enzymes: No results for input(s): CKTOTAL, CKMB, CKMBINDEX, TROPONINI in the last 168 hours. CBG: Recent Labs  Lab 05/26/19 1708 05/26/19 2308 05/27/19 0435 05/27/19 0741 05/27/19 1203  GLUCAP 121* 100* 115* 124* 131*    Studies/Results: Dg Chest Portable 1 View  Result Date: 05/25/2019 CLINICAL DATA:  Hypotension. EXAM: PORTABLE CHEST 1 VIEW COMPARISON:  May 16, 2019 FINDINGS: There is a well-positioned tunneled dialysis catheter. The heart size is enlarged but stable from prior study. There is no pneumothorax. The lung volumes are somewhat low. There is mild vascular congestion without overt pulmonary edema. There may be atelectasis at the lung bases. IMPRESSION: No active disease. Electronically Signed   By: Constance Holster M.D.   On: 05/25/2019 16:47   Medications: . ferric gluconate (FERRLECIT/NULECIT) IV 125 mg (05/26/19 2145)  . pantoprozole (PROTONIX) infusion 8 mg/hr  (05/27/19 0512)   . sodium chloride   Intravenous Once  . carvedilol  3.125 mg Oral BID WC  . [START ON 06/05/2019] darbepoetin (ARANESP) injection - DIALYSIS  60 mcg Intravenous Q Tue-HD  . [START ON 05/29/2019] doxercalciferol  1 mcg Intravenous Q T,Th,Sa-HD  . [START ON 05/29/2019] pantoprazole  40 mg Intravenous Q12H

## 2019-05-27 NOTE — Progress Notes (Signed)
Patient's hematocrit was 17.5 reported from lab core. Report was from 3 days ago ( 05/25/2019). Patient already have blood transfusion 2 unit on 05/26/2019. Will continue monitor the patient.

## 2019-05-27 NOTE — Progress Notes (Addendum)
Progress Note  Patient Name: Louis Bradley Date of Encounter: 05/27/2019  Primary Cardiologist: Candee Furbish, MD   Subjective   Ingested capsule endoscopy at 9 AM. No chest pain, no shortness of breath Spoke to wife on phone  Inpatient Medications    Scheduled Meds: . [START ON 06/05/2019] darbepoetin (ARANESP) injection - DIALYSIS  60 mcg Intravenous Q Tue-HD  . [START ON 05/29/2019] doxercalciferol  1 mcg Intravenous Q T,Th,Sa-HD  . [START ON 05/29/2019] pantoprazole  40 mg Intravenous Q12H   Continuous Infusions: . ferric gluconate (FERRLECIT/NULECIT) IV 125 mg (05/26/19 2145)  . pantoprozole (PROTONIX) infusion 8 mg/hr (05/27/19 0512)   PRN Meds: acetaminophen **OR** acetaminophen, ondansetron **OR** ondansetron (ZOFRAN) IV   Vital Signs    Vitals:   05/27/19 0313 05/27/19 0433 05/27/19 0600 05/27/19 0930  BP: (!) 126/56   137/60  Pulse: (!) 45   67  Resp: 20   19  Temp:  98 F (36.7 C)    TempSrc:  Oral    SpO2: 98%   100%  Weight:   57.4 kg   Height:        Intake/Output Summary (Last 24 hours) at 05/27/2019 1036 Last data filed at 05/27/2019 0559 Gross per 24 hour  Intake 300 ml  Output 1545 ml  Net -1245 ml   Last 3 Weights 05/27/2019 05/26/2019 05/26/2019  Weight (lbs) 126 lb 8 oz 277 lb 12.5 oz 279 lb 15.8 oz  Weight (kg) 57.38 kg 126 kg 127 kg      Telemetry    Atrial fibrillation variable heart rate- Personally Reviewed  ECG    Atrial flutter/fib, T wave inversions laterally- Personally Reviewed  Physical Exam   GEN: No acute distress.   Neck: No JVD Cardiac:  Irregularly irregular, no murmurs, rubs, or gallops.  Respiratory: Clear to auscultation bilaterally. GI: Soft, nontender, non-distended  MS: No edema; No deformity. Neuro:  Nonfocal  Psych: Normal affect   Labs    High Sensitivity Troponin:   Recent Labs  Lab 05/10/19 1924 05/10/19 2332 05/12/19 1434 05/26/19 0023 05/26/19 0943  TROPONINIHS 1,542* 2,052* 2,283* 4,342*  5,401*      Cardiac EnzymesNo results for input(s): TROPONINI in the last 168 hours. No results for input(s): TROPIPOC in the last 168 hours.   Chemistry Recent Labs  Lab 05/25/19 1642 05/27/19 0752  NA 138 138  K 3.6 3.9  CL 98 103  CO2 30 26  GLUCOSE 163* 136*  BUN 46* 41*  CREATININE 4.35* 3.80*  CALCIUM 8.1* 8.1*  PROT 5.7*  --   ALBUMIN 3.1*  --   AST 20  --   ALT 29  --   ALKPHOS 51  --   BILITOT 0.4  --   GFRNONAA 13* 15*  GFRAA 15* 17*  ANIONGAP 10 9     Hematology Recent Labs  Lab 05/25/19 1642  05/26/19 0943 05/26/19 1649 05/27/19 0752  WBC 12.1*  --   --   --  8.3  RBC 2.03*  --   --   --  2.43*  HGB 6.2*   < > 7.6* 7.5* 7.2*  HCT 19.8*   < > 23.6* 23.2* 23.1*  MCV 97.5  --   --   --  95.1  MCH 30.5  --   --   --  29.6  MCHC 31.3  --   --   --  31.2  RDW 17.1*  --   --   --  17.6*  PLT 149*  --   --   --  139*   < > = values in this interval not displayed.    BNPNo results for input(s): BNP, PROBNP in the last 168 hours.   DDimer No results for input(s): DDIMER in the last 168 hours.   Radiology    Dg Chest Portable 1 View  Result Date: 05/25/2019 CLINICAL DATA:  Hypotension. EXAM: PORTABLE CHEST 1 VIEW COMPARISON:  May 16, 2019 FINDINGS: There is a well-positioned tunneled dialysis catheter. The heart size is enlarged but stable from prior study. There is no pneumothorax. The lung volumes are somewhat low. There is mild vascular congestion without overt pulmonary edema. There may be atelectasis at the lung bases. IMPRESSION: No active disease. Electronically Signed   By: Constance Holster M.D.   On: 05/25/2019 16:47    Cardiac Studies   Echo from prior admission EF 50%, moderate AS  Patient Profile     73 y.o. male with demand ischemia, troponin 5000 in the setting of marked anemia hemoglobin in the 6 range, GI bleed, melena, capsule endoscopy, obesity, end-stage renal disease new start hemodialysis  Assessment & Plan    Atrial  fibrillation persistent - Flutter noted as well.  Unable to anticoagulate because of bleed.  Discussed this with he and his wife.  This does increase risk of possible thrombotic stroke. -Continue with good rate control.  He was on low-dose Coreg 3125 twice a day.  This was held because of hypotension.  Acute GI bleed - Continues to be his significant issue at this point.  Transfusions have taken place.  Increased bleeding risk with hemodialysis.  Capsule endoscopy, [facility].  IV Protonix. Getting another unit of blood  Elevated troponin - Significantly elevated at 5000 range in the setting of severe anemia.  Discussed with him that he certainly could have underlying significant coronary artery disease.  He is not having any chest pain.  Appreciate Dr. Burt Knack with interventional cardiology consult last admission.  At this time unable to undergo invasive evaluation given his continued bleeding.  It would be nice for [place] to see a few weeks of stable hemoglobin before proceeding with diagnostic angiography.  Discussed with Dr. Burt Knack.  Thankfully, he is not having any anginal symptoms.  I discussed with he and his wife that this does however increase his overall morbidity mortality.  End-stage renal disease - Hemodialysis.  Aortic stenosis  - moderate on ECHO     For questions or updates, please contact [place] Please consult [email or web address] for contact info under        Signed, Candee Furbish, MD  05/27/2019, 10:36 AM

## 2019-05-27 NOTE — Progress Notes (Signed)
PROGRESS NOTE    [name]  VEL:[id number] DOB: July 22, 1946 DOA: 05/25/2019 PCP: Colon Branch, MD   Brief Narrative:  Louis Bradley is a 73 y.o. male with history of chronic kidney disease recently started on hemodialysis during last admission discharged on May 17, 2029 a week ago after being admitted for shortness of breath was found to have acute GI bleed after he was found to have gastritis and duodenitis but no active bleed to EGD that was done on 6/28.  During that hospitalization patient also underwent dialysis catheter placement and dialysis was started had gone for his routine dialysis on Thursday, 05/24/2019 and had labs done which showed a low hemoglobin of 6.2 and was advised to come to the ER.  Patient denied any chest pain shortness of breath nausea vomiting abdominal pain or diarrhea.  During last admission patient also had non-ST elevation MI and was at this time conservatively managed due to patient's acute GI bleed.  In the ER patient was hypotensive with blood pressure in the 25E systolic was given 1 L fluid bolus.  Hemoglobin was 6.2 on-call [facility] gastroenterologist was consulted patient started on Protonix and 3 units of PRBC transfusion ordered.  EKG shows possible A. fib with ST depression in the lateral leads which are comparable to the old EKG. patient did not have any signs or symptoms of ACS.  He had elevated troponin, more than previous 1 so cardiology was consulted by admitting physician.  GI and nephrology has been on board as well.  Consultants:   Nephrology  GI  Cardiology  Procedures:   None  Antimicrobials:   None   Subjective: Patient seen and examined.  Once again he has no complaints.  He has not had any bowel movement since last 24 hours.  Tolerating diet.  He ingested capsule early this morning.  Objective: Vitals:   05/27/19 0313 05/27/19 0433 05/27/19 0600 05/27/19 0930  BP: (!) 126/56   137/60  Pulse: (!) 45   67  Resp: 20   19   Temp:  98 F (36.7 C)    TempSrc:  Oral    SpO2: 98%   100%  Weight:   57.4 kg   Height:        Intake/Output Summary (Last 24 hours) at 05/27/2019 1229 Last data filed at 05/27/2019 0559 Gross per 24 hour  Intake 300 ml  Output 1545 ml  Net -1245 ml   Filed Weights   05/26/19 1935 05/26/19 2245 05/27/19 0600  Weight: 127 kg 126 kg 57.4 kg    Examination: General exam: Appears calm and comfortable  Respiratory system: Clear to auscultation. Respiratory effort normal. Cardiovascular system: S1 & S2 heard, irregularly irregular rate and rhythm, no JVD, murmurs, rubs, gallops or clicks. No pedal edema. Gastrointestinal system: Abdomen is nondistended, soft and nontender. No organomegaly or masses felt. Normal bowel sounds heard. Central nervous system: Alert and oriented. No focal neurological deficits. Extremities: Symmetric 5 x 5 power. Skin: No rashes, lesions or ulcers Psychiatry: Judgement and insight appear normal. Mood & affect appropriate.    Data Reviewed: I have personally reviewed following labs and imaging studies  CBC: Recent Labs  Lab 05/25/19 1642 05/26/19 0023 05/26/19 0943 05/26/19 1649 05/27/19 0752  WBC 12.1*  --   --   --  8.3  NEUTROABS 10.0*  --   --   --   --   HGB 6.2* 6.2* 7.6* 7.5* 7.2*  HCT 19.8* 19.0* 23.6* 23.2*  23.1*  MCV 97.5  --   --   --  95.1  PLT 149*  --   --   --  798*   Basic Metabolic Panel: Recent Labs  Lab 05/25/19 1642 05/27/19 0752  NA 138 138  K 3.6 3.9  CL 98 103  CO2 30 26  GLUCOSE 163* 136*  BUN 46* 41*  CREATININE 4.35* 3.80*  CALCIUM 8.1* 8.1*   GFR: Estimated Creatinine Clearance: 14.1 mL/min (A) (by C-G formula based on SCr of 3.8 mg/dL (H)). Liver Function Tests: Recent Labs  Lab 05/25/19 1642  AST 20  ALT 29  ALKPHOS 51  BILITOT 0.4  PROT 5.7*  ALBUMIN 3.1*   Recent Labs  Lab 05/25/19 1642  LIPASE 34   Recent Labs  Lab 05/25/19 1644  AMMONIA 13   Coagulation Profile: No results for  input(s): INR, PROTIME in the last 168 hours. Cardiac Enzymes: No results for input(s): CKTOTAL, CKMB, CKMBINDEX, TROPONINI in the last 168 hours. BNP (last 3 results) No results for input(s): PROBNP in the last 8760 hours. HbA1C: No results for input(s): HGBA1C in the last 72 hours. CBG: Recent Labs  Lab 05/26/19 1708 05/26/19 2308 05/27/19 0435 05/27/19 0741 05/27/19 1203  GLUCAP 121* 100* 115* 124* 131*   Lipid Profile: No results for input(s): CHOL, HDL, LDLCALC, TRIG, CHOLHDL, LDLDIRECT in the last 72 hours. Thyroid Function Tests: No results for input(s): TSH, T4TOTAL, FREET4, T3FREE, THYROIDAB in the last 72 hours. Anemia Panel: No results for input(s): VITAMINB12, FOLATE, FERRITIN, TIBC, IRON, RETICCTPCT in the last 72 hours. Sepsis Labs: Recent Labs  Lab 05/25/19 1644  LATICACIDVEN 1.3    Recent Results (from the past 240 hour(s))  SARS Coronavirus 2 (CEPHEID - Performed in [facility] hospital lab), Hosp Order     Status: None   Collection Time: 05/25/19  7:11 PM   Specimen: Nasopharyngeal Swab  Result Value Ref Range Status   SARS Coronavirus 2 NEGATIVE NEGATIVE Final    Comment: (NOTE) If result is NEGATIVE SARS-CoV-2 target nucleic acids are NOT DETECTED. The SARS-CoV-2 RNA is generally detectable in upper and lower  respiratory specimens during the acute phase of infection. The lowest  concentration of SARS-CoV-2 viral copies this assay can detect is 250  copies / mL. A negative result does not preclude SARS-CoV-2 infection  and should not be used as the sole basis for treatment or other  patient management decisions.  A negative result may occur with  improper specimen collection / handling, submission of specimen other  than nasopharyngeal swab, presence of viral mutation(s) within the  areas targeted by this assay, and inadequate number of viral copies  (<250 copies / mL). A negative result must be combined with clinical  observations, patient  history, and epidemiological information. If result is POSITIVE SARS-CoV-2 target nucleic acids are DETECTED. The SARS-CoV-2 RNA is generally detectable in upper and lower  respiratory specimens dur ing the acute phase of infection.  Positive  results are indicative of active infection with SARS-CoV-2.  Clinical  correlation with patient history and other diagnostic information is  necessary to determine patient infection status.  Positive results do  not rule out bacterial infection or co-infection with other viruses. If result is PRESUMPTIVE POSTIVE SARS-CoV-2 nucleic acids MAY BE PRESENT.   A presumptive positive result was obtained on the submitted specimen  and confirmed on repeat testing.  While 2019 novel coronavirus  (SARS-CoV-2) nucleic acids may be present in the submitted sample  additional  confirmatory testing may be necessary for epidemiological  and / or clinical management purposes  to differentiate between  SARS-CoV-2 and other Sarbecovirus currently known to infect humans.  If clinically indicated additional testing with an alternate test  methodology [phone]) is advised. The SARS-CoV-2 RNA is generally  detectable in upper and lower respiratory sp ecimens during the acute  phase of infection. The expected result is Negative. Fact Sheet for Patients:  StrictlyIdeas.no Fact Sheet for Healthcare Providers: BankingDealers.co.za This test is not yet approved or cleared by the [place] FDA and has been authorized for detection and/or diagnosis of SARS-CoV-2 by FDA under an Emergency Use Authorization (EUA).  This EUA will remain in effect (meaning this test can be used) for the duration of the COVID-19 declaration under Section 564(b)(1) of the Act, 21 U.S.C. section 360bbb-3(b)(1), unless the authorization is terminated or revoked sooner. Performed at [facility], [place] [address], [place], [place] [address]        Radiology Studies: Dg Chest Portable 1 View  Result Date: 05/25/2019 CLINICAL DATA:  Hypotension. EXAM: PORTABLE CHEST 1 VIEW COMPARISON:  May 16, 2019 FINDINGS: There is a well-positioned tunneled dialysis catheter. The heart size is enlarged but stable from prior study. There is no pneumothorax. The lung volumes are somewhat low. There is mild vascular congestion without overt pulmonary edema. There may be atelectasis at the lung bases. IMPRESSION: No active disease. Electronically Signed   By: Constance Holster M.D.   On: 05/25/2019 16:47    Scheduled Meds: . carvedilol  3.125 mg Oral BID WC  . [START ON 06/05/2019] darbepoetin (ARANESP) injection - DIALYSIS  60 mcg Intravenous Q Tue-HD  . [START ON 05/29/2019] doxercalciferol  1 mcg Intravenous Q T,Th,Sa-HD  . [START ON 05/29/2019] pantoprazole  40 mg Intravenous Q12H   Continuous Infusions: . ferric gluconate (FERRLECIT/NULECIT) IV 125 mg (05/26/19 2145)  . pantoprozole (PROTONIX) infusion 8 mg/hr (05/27/19 0512)     LOS: 2 days   Assessment & Plan:   Principal Problem:   Acute GI bleeding Active Problems:   Chronic renal failure in pediatric patient, stage 4 (severe) ([place])   Diabetes mellitus type 2 with complications (HCC)   Atrial fibrillation ([place])   Pulmonary hypertension ([place])   ESRD (end stage renal disease) ([place])   Symptomatic anemia/acute blood loss anemia/possible upper GI bleed/hypotension: Patient has received total of 3 units of PRBC transfusion so far.  His hemoglobin has remained about 7 and currently 7.2.  With having strong history of cardiac issues and atrial fibrillation with intermittent RVR, I would prefer to keep his hemoglobin above 8 so I will transfuse him 1 more unit today.  He was seen by GI and he is going to capsule endoscopy right now to hunt for possible GI bleed.  Recent non-ST elevation MI/ST depression on EKG/elevated troponin: He has no signs or symptoms of chest pain or shortness of  breath.  This is likely type II MI.  Continue to monitor.  No aspirin or Plavix due to acute active GI bleed.  Per cardiology, he is not a good candidate for cardiac catheterization since he will not be able to tolerate any antiplatelet if he were to require any stents.  Cardiology on board and defer further management to them.  CKD stage V on hemodialysis: Consulted nephrology.  Type 2 diabetes mellitus: Hold oral hypoglycemics.  Start on SSI.  OSA on CPAP: Nightly CPAP.  Atrial fibrillation with RVR: Has been in and out of  RVR.  Not on anticoagulation due to active GI bleed.  Was on carvedilol at home which was held due to hypotension.  Now that his blood pressure has improved so I will give it a try and resume his carvedilol with holding parameters.  Cardiology on board.  Appreciate their help.  DVT prophylaxis: SCD/avoiding heparin products due to active GI bleed. Code Status: Full code Family Communication: Called patient's wife on 05/26/2019.  She had several questions and most of those questions she asked me twice which I answered to the best of my ability at least twice.  She had several more questions which I believed were more appropriate for nephrology and GI.  She wants everything to be done for the patient, she wants full work-up including EGD and colonoscopy and CT scan of all body to find out where the GI bleed is coming from.  She also wants to talk to GI and nephrology to answer more questions.  I see that cardiology has been talking to her daily. Disposition Plan: To be determined   Time spent: 29 minutes   Darliss Cheney, MD [facility] Pager [phone]  If 7PM-7AM, please contact night-coverage [email or web address] Password [email or web address] 05/27/2019, 12:29 PM

## 2019-05-27 NOTE — Progress Notes (Signed)
Pt ingested pill camera at 0915.  Instructions given to pt and nurse.  Pt verbalized understanding.   Vista Lawman, RN

## 2019-05-27 NOTE — Progress Notes (Signed)
Pt placed on nasal CPAP 12 cm H20.  Pt tolerating well at this time.  RT will continue to monitor.

## 2019-05-28 ENCOUNTER — Inpatient Hospital Stay: Payer: Medicare Other | Admitting: Internal Medicine

## 2019-05-28 ENCOUNTER — Encounter (HOSPITAL_COMMUNITY): Payer: Self-pay | Admitting: Gastroenterology

## 2019-05-28 DIAGNOSIS — I4891 Unspecified atrial fibrillation: Secondary | ICD-10-CM

## 2019-05-28 LAB — BASIC METABOLIC PANEL
Anion gap: 9 (ref 5–15)
BUN: 47 mg/dL — ABNORMAL HIGH (ref 8–23)
CO2: 26 mmol/L (ref 22–32)
Calcium: 8 mg/dL — ABNORMAL LOW (ref 8.9–10.3)
Chloride: 102 mmol/L (ref 98–111)
Creatinine, Ser: 4.28 mg/dL — ABNORMAL HIGH (ref 0.61–1.24)
GFR calc Af Amer: 15 mL/min — ABNORMAL LOW (ref >60)
GFR calc non Af Amer: 13 mL/min — ABNORMAL LOW (ref >60)
Glucose, Bld: 119 mg/dL — ABNORMAL HIGH (ref 70–99)
Potassium: 4.1 mmol/L (ref 3.5–5.1)
Sodium: 137 mmol/L (ref 135–145)

## 2019-05-28 LAB — CBC WITH DIFFERENTIAL/PLATELET
Abs Immature Granulocytes: 0.05 10*3/uL (ref 0.00–0.07)
Basophils Absolute: 0 10*3/uL (ref 0.0–0.1)
Basophils Relative: 0 %
Eosinophils Absolute: 0.2 10*3/uL (ref 0.0–0.5)
Eosinophils Relative: 2 %
HCT: 23.2 % — ABNORMAL LOW (ref 39.0–52.0)
Hemoglobin: 7.4 g/dL — ABNORMAL LOW (ref 13.0–17.0)
Immature Granulocytes: 1 %
Lymphocytes Relative: 9 %
Lymphs Abs: 0.8 10*3/uL (ref 0.7–4.0)
MCH: 30.1 pg (ref 26.0–34.0)
MCHC: 31.9 g/dL (ref 30.0–36.0)
MCV: 94.3 fL (ref 80.0–100.0)
Monocytes Absolute: 0.7 10*3/uL (ref 0.1–1.0)
Monocytes Relative: 8 %
Neutro Abs: 7.7 10*3/uL (ref 1.7–7.7)
Neutrophils Relative %: 80 %
Platelets: 138 10*3/uL — ABNORMAL LOW (ref 150–400)
RBC: 2.46 MIL/uL — ABNORMAL LOW (ref 4.22–5.81)
RDW: 17.2 % — ABNORMAL HIGH (ref 11.5–15.5)
WBC: 9.6 10*3/uL (ref 4.0–10.5)
nRBC: 0 % (ref 0.0–0.2)

## 2019-05-28 LAB — TYPE AND SCREEN
ABO/RH(D): O POS
Antibody Screen: NEGATIVE
Unit division: 0
Unit division: 0
Unit division: 0
Unit division: 0

## 2019-05-28 LAB — BPAM RBC
Blood Product Expiration Date: 202008112359
Blood Product Expiration Date: 202008112359
Blood Product Expiration Date: 202008112359
Blood Product Expiration Date: 202008112359
ISSUE DATE / TIME: 202007101932
ISSUE DATE / TIME: 202007110133
ISSUE DATE / TIME: 202007110431
ISSUE DATE / TIME: 202007121333
Unit Type and Rh: 5100
Unit Type and Rh: 5100
Unit Type and Rh: 5100
Unit Type and Rh: 5100

## 2019-05-28 LAB — IRON AND TIBC
Iron: 16 ug/dL — ABNORMAL LOW (ref 45–182)
Saturation Ratios: 6 % — ABNORMAL LOW (ref 17.9–39.5)
TIBC: 279 ug/dL (ref 250–450)
UIBC: 263 ug/dL

## 2019-05-28 LAB — GLUCOSE, CAPILLARY
Glucose-Capillary: 108 mg/dL — ABNORMAL HIGH (ref 70–99)
Glucose-Capillary: 120 mg/dL — ABNORMAL HIGH (ref 70–99)
Glucose-Capillary: 133 mg/dL — ABNORMAL HIGH (ref 70–99)
Glucose-Capillary: 140 mg/dL — ABNORMAL HIGH (ref 70–99)
Glucose-Capillary: 152 mg/dL — ABNORMAL HIGH (ref 70–99)
Glucose-Capillary: 173 mg/dL — ABNORMAL HIGH (ref 70–99)

## 2019-05-28 LAB — PREPARE RBC (CROSSMATCH)

## 2019-05-28 LAB — HEMOGLOBIN AND HEMATOCRIT, BLOOD
HCT: 26.1 % — ABNORMAL LOW (ref 39.0–52.0)
Hemoglobin: 8.3 g/dL — ABNORMAL LOW (ref 13.0–17.0)

## 2019-05-28 MED ORDER — CALCIUM ACETATE (PHOS BINDER) 667 MG PO CAPS
667.0000 mg | ORAL_CAPSULE | Freq: Three times a day (TID) | ORAL | Status: DC
  Administered 2019-05-28 – 2019-06-01 (×7): 667 mg via ORAL
  Filled 2019-05-28 (×8): qty 1

## 2019-05-28 MED ORDER — SODIUM CHLORIDE 0.9% IV SOLUTION: Freq: Once | INTRAVENOUS | Status: DC

## 2019-05-28 MED ORDER — DARBEPOETIN ALFA 150 MCG/0.3ML IJ SOSY
150.0000 ug | PREFILLED_SYRINGE | INTRAMUSCULAR | Status: DC
  Administered 2019-05-29: 16:00:00 150 ug via INTRAVENOUS
  Filled 2019-05-28: qty 0.3

## 2019-05-28 MED ORDER — CHLORHEXIDINE GLUCONATE CLOTH 2 % EX PADS
6.0000 | MEDICATED_PAD | Freq: Every day | CUTANEOUS | Status: DC
  Administered 2019-05-29 – 2019-05-31 (×3): 6 via TOPICAL

## 2019-05-28 MED ORDER — SODIUM CHLORIDE 0.9 % IV SOLN
125.0000 mg | Freq: Every day | INTRAVENOUS | Status: AC
  Administered 2019-05-29 – 2019-06-01 (×4): 125 mg via INTRAVENOUS
  Filled 2019-05-28 (×4): qty 10

## 2019-05-28 NOTE — Progress Notes (Signed)
PROGRESS NOTE    Louis Bradley  YYT:[id number] DOB: [birth year] DOA: 05/25/2019 PCP: Colon Branch, MD   Brief Narrative:  Louis Bradley is a 73 y.o. male with history of chronic kidney disease recently started on hemodialysis during last admission discharged on May 17, 2029 a week ago after being admitted for shortness of breath was found to have acute GI bleed after he was found to have gastritis and duodenitis but no active bleed to EGD that was done on 6/28.  During that hospitalization patient also underwent dialysis catheter placement and dialysis was started had gone for his routine dialysis on Thursday, 05/24/2019 and had labs done which showed a low hemoglobin of 6.2 and was advised to come to the ER.  Patient denied any chest pain shortness of breath nausea vomiting abdominal pain or diarrhea.  During last admission patient also had non-ST elevation MI and was at this time conservatively managed due to patient's acute GI bleed.  In the ER patient was hypotensive with blood pressure in the 27N systolic was given 1 L fluid bolus. Hemoglobin was 6.2 on-call [facility] gastroenterologist was consulted patient started on Protonix and 3 units of PRBC transfusion ordered.  EKG shows possible A. fib with ST depression in the lateral leads which are comparable to the old EKG. patient did not have any signs or symptoms of ACS.  He had elevated troponin, more than previous 1 so cardiology was consulted by admitting physician.  GI and nephrology has been on board as well.  Patient's hemoglobin has remained stable just over 7.  Due to him having intermittent atrial fibrillation with RVR, he was transfused 1 more unit of blood on 05/27/2019 which improved his hemoglobin to 7.8 only to drop again to 7.4 today.  Completed capsule endoscopy which shows possibility of small bowel bleed.  GI planning on possibly push enteroscopy tomorrow.  Consultants:   Nephrology  GI  Cardiology  Procedures:    None  Antimicrobials:   None   Subjective: Patient seen and examined.  He has no complaints.  Has not had any bowel movement in 48 hours.  No nausea.  Tolerating clears.  His wife was on the phone and on speaker when I was in his room.  She asked me couple of questions which were answered.  Objective: Vitals:   05/28/19 0352 05/28/19 0554 05/28/19 0800 05/28/19 1239  BP: (!) 113/36  122/61   Pulse: 75     Resp: 17     Temp: 98.2 F (36.8 C)  99.1 F (37.3 C) 98 F (36.7 C)  TempSrc: Oral  Axillary Oral  SpO2: 100%     Weight:  127.7 kg    Height:        Intake/Output Summary (Last 24 hours) at 05/28/2019 1317 Last data filed at 05/28/2019 1251 Gross per 24 hour  Intake 360 ml  Output --  Net 360 ml   Filed Weights   05/26/19 2245 05/27/19 0600 05/28/19 0554  Weight: 126 kg 57.4 kg 127.7 kg    Examination: General exam: Appears calm and comfortable  Respiratory system: Clear to auscultation. Respiratory effort normal. Cardiovascular system: S1 & S2 heard, irregularly regular rate and rhythm. No JVD, murmurs, rubs, gallops or clicks. No pedal edema. Gastrointestinal system: Abdomen is nondistended, soft and nontender. No organomegaly or masses felt. Normal bowel sounds heard. Central nervous system: Alert and oriented. No focal neurological deficits. Extremities: Symmetric 5 x 5 power. Skin: No rashes, lesions or  ulcers Psychiatry: Judgement and insight appear normal. Mood & affect appropriate.     Data Reviewed: I have personally reviewed following labs and imaging studies  CBC: Recent Labs  Lab 05/25/19 1642  05/26/19 0943 05/26/19 1649 05/27/19 0752 05/27/19 1805 05/28/19 0215  WBC 12.1*  --   --   --  8.3  --  9.6  NEUTROABS 10.0*  --   --   --   --   --  7.7  HGB 6.2*   < > 7.6* 7.5* 7.2* 7.8* 7.4*  HCT 19.8*   < > 23.6* 23.2* 23.1* 24.8* 23.2*  MCV 97.5  --   --   --  95.1  --  94.3  PLT 149*  --   --   --  139*  --  138*   < > = values in this  interval not displayed.   Basic Metabolic Panel: Recent Labs  Lab 05/25/19 1642 05/27/19 0752 05/28/19 0215  NA 138 138 137  K 3.6 3.9 4.1  CL 98 103 102  CO2 30 26 26   GLUCOSE 163* 136* 119*  BUN 46* 41* 47*  CREATININE 4.35* 3.80* 4.28*  CALCIUM 8.1* 8.1* 8.0*   GFR: Estimated Creatinine Clearance: 21.8 mL/min (A) (by C-G formula based on SCr of 4.28 mg/dL (H)). Liver Function Tests: Recent Labs  Lab 05/25/19 1642  AST 20  ALT 29  ALKPHOS 51  BILITOT 0.4  PROT 5.7*  ALBUMIN 3.1*   Recent Labs  Lab 05/25/19 1642  LIPASE 34   Recent Labs  Lab 05/25/19 1644  AMMONIA 13   Coagulation Profile: No results for input(s): INR, PROTIME in the last 168 hours. Cardiac Enzymes: No results for input(s): CKTOTAL, CKMB, CKMBINDEX, TROPONINI in the last 168 hours. BNP (last 3 results) No results for input(s): PROBNP in the last 8760 hours. HbA1C: No results for input(s): HGBA1C in the last 72 hours. CBG: Recent Labs  Lab 05/27/19 2005 05/28/19 0005 05/28/19 0350 05/28/19 0758 05/28/19 1235  GLUCAP 138* 108* 120* 140* 173*   Lipid Profile: No results for input(s): CHOL, HDL, LDLCALC, TRIG, CHOLHDL, LDLDIRECT in the last 72 hours. Thyroid Function Tests: No results for input(s): TSH, T4TOTAL, FREET4, T3FREE, THYROIDAB in the last 72 hours. Anemia Panel: Recent Labs    05/28/19 0215  TIBC 279  IRON 16*   Sepsis Labs: Recent Labs  Lab 05/25/19 1644  LATICACIDVEN 1.3    Recent Results (from the past 240 hour(s))  SARS Coronavirus 2 (CEPHEID - Performed in [place] [facility] hospital lab), Hosp Order     Status: None   Collection Time: 05/25/19  7:11 PM   Specimen: Nasopharyngeal Swab  Result Value Ref Range Status   SARS Coronavirus 2 NEGATIVE NEGATIVE Final    Comment: (NOTE) If result is NEGATIVE SARS-CoV-2 target nucleic acids are NOT DETECTED. The SARS-CoV-2 RNA is generally detectable in upper and lower  respiratory specimens during the acute phase of  infection. The lowest  concentration of SARS-CoV-2 viral copies this assay can detect is 250  copies / mL. A negative result does not preclude SARS-CoV-2 infection  and should not be used as the sole basis for treatment or other  patient management decisions.  A negative result may occur with  improper specimen collection / handling, submission of specimen other  than nasopharyngeal swab, presence of viral mutation(s) within the  areas targeted by this assay, and inadequate number of viral copies  (<250 copies / mL). A negative result must  be combined with clinical  observations, patient history, and epidemiological information. If result is POSITIVE SARS-CoV-2 target nucleic acids are DETECTED. The SARS-CoV-2 RNA is generally detectable in upper and lower  respiratory specimens dur ing the acute phase of infection.  Positive  results are indicative of active infection with SARS-CoV-2.  Clinical  correlation with patient history and other diagnostic information is  necessary to determine patient infection status.  Positive results do  not rule out bacterial infection or co-infection with other viruses. If result is PRESUMPTIVE POSTIVE SARS-CoV-2 nucleic acids MAY BE PRESENT.   A presumptive positive result was obtained on the submitted specimen  and confirmed on repeat testing.  While 2019 novel coronavirus  (SARS-CoV-2) nucleic acids may be present in the submitted sample  additional confirmatory testing may be necessary for epidemiological  and / or clinical management purposes  to differentiate between  SARS-CoV-2 and other Sarbecovirus currently known to infect humans.  If clinically indicated additional testing with an alternate test  methodology [phone]) is advised. The SARS-CoV-2 RNA is generally  detectable in upper and lower respiratory sp ecimens during the acute  phase of infection. The expected result is Negative. Fact Sheet for Patients:   StrictlyIdeas.no Fact Sheet for Healthcare Providers: BankingDealers.co.za This test is not yet approved or cleared by the [place] FDA and has been authorized for detection and/or diagnosis of SARS-CoV-2 by FDA under an Emergency Use Authorization (EUA).  This EUA will remain in effect (meaning this test can be used) for the duration of the COVID-19 declaration under Section 564(b)(1) of the Act, 21 U.S.C. section 360bbb-3(b)(1), unless the authorization is terminated or revoked sooner. Performed at [facility], [place] [address], [place], [place] [address]       Radiology Studies: No results found.  Scheduled Meds:  calcium acetate  667 mg Oral TID WC   carvedilol  3.125 mg Oral BID WC   [START ON 05/29/2019] Chlorhexidine Gluconate Cloth  6 each Topical Q0600   [START ON 05/29/2019] darbepoetin (ARANESP) injection - DIALYSIS  150 mcg Intravenous Q Tue-HD   [START ON 05/29/2019] doxercalciferol  1 mcg Intravenous Q T,Th,Sa-HD   [START ON 05/29/2019] pantoprazole  40 mg Intravenous Q12H   Continuous Infusions:  [START ON 05/29/2019] ferric gluconate (FERRLECIT/NULECIT) IV     pantoprozole (PROTONIX) infusion 8 mg/hr (05/28/19 1144)     LOS: 3 days   Assessment & Plan:   Principal Problem:   Acute GI bleeding Active Problems:   Chronic renal failure in pediatric patient, stage 4 (severe) (HCC)   Diabetes mellitus type 2 with complications ([place])   Atrial fibrillation ([place])   Pulmonary hypertension ([place])   ESRD (end stage renal disease) ([place])   Symptomatic anemia/acute blood loss anemia/possible upper GI bleed/hypotension: Patient has received total of 3 units of PRBC transfusion so far.  His hemoglobin has remained about 7 and currently 7.2.  With having strong history of cardiac issues and atrial fibrillation with intermittent RVR, I would prefer to keep his hemoglobin above 8 and for that reason, he was  transfused fourth unit of PRBC on 05/27/2019 which improved his hemoglobin to 7.8 only to drop to 7.4 today.  Will transfuse him 1 more unit today.  Capsule endoscopy shows possibility of small bowel bleeding.  Per GI note, they are planning on doing push endoscopy tomorrow.  We will keep him n.p.o. from midnight per their recommendations.  Further management per GI.    Recent non-ST elevation MI/ST depression  on EKG/elevated troponin: He has no signs or symptoms of chest pain or shortness of breath.  This is likely type II MI.  Continue to monitor.  No aspirin or Plavix due to acute active GI bleed.  Per cardiology, he is not a good candidate for cardiac catheterization since he will not be able to tolerate any antiplatelet if he were to require any stents.  Cardiology on board and defer further management to them.  CKD stage V on hemodialysis: Consulted nephrology.  Type 2 diabetes mellitus: Hold oral hypoglycemics.  Start on SSI.  OSA on CPAP: Nightly CPAP.  Atrial fibrillation with RVR: Has been in and out of RVR.  Due to improved blood pressure, I resumed his carvedilol yesterday and now his heart rate has remained controlled.  Cardiology has seen him and per their note, they are going to think about switching him to short acting Lopressor.  Will defer further management to cardiology.  DVT prophylaxis: SCD/avoiding heparin products due to active GI bleed. Code Status: Full code Family Communication: Spoke to his wife over the phone on speaker in his presence when I was seeing him.  Answered several questions. Disposition Plan: To be determined   Time spent: 28 minutes   Darliss Cheney, MD [facility] Pager [phone]  If 7PM-7AM, please contact night-coverage [email or web address] Password [email or web address] 05/28/2019, 1:17 PM

## 2019-05-28 NOTE — Progress Notes (Signed)
Subjective:  No new c/o's  Objective Vital signs in last 24 hours: Vitals:   05/28/19 0028 05/28/19 0352 05/28/19 0554 05/28/19 0800  BP:  (!) 113/36  122/61  Pulse: 74 75    Resp: 19 17    Temp:  98.2 F (36.8 C)  99.1 F (37.3 C)  TempSrc:  Oral  Axillary  SpO2: 97% 100%    Weight:   127.7 kg   Height:       Weight change: 0.7 kg  Intake/Output Summary (Last 24 hours) at 05/28/2019 1212 Last data filed at 05/28/2019 0000 Gross per 24 hour  Intake 120 ml  Output -  Net 120 ml   OP HD = [facility] - just started this month 4h 53min Hep none 2/2.25 bath 123.5kg R TDC/ LUA AVF Hec 1 mcg q hd  Mircera 60 mcg q 2wks (last given 7/07) hgb 6.0<7.4  Venofer 100mg  load Last dose 7/11  Acess= R IJ Perm cath/ LUA AVF place 05/16/19   Assessment/ Plan: Pt is a 73 y.o. yo male recent start on dialysis who was admitted on 05/25/2019 with GIB and symptomatic anemia  Assessment/Plan: 1. Recurrent GI Bleed with symptomatic anemia= GI seeing / transfused 1 unit on 7/10. 2 units on 7/11 and 1 unit on 7/12. Had small bowel capsule test as well- fresh bleeding in distal duod vs prox jejunum- rec diagnostic push enteroscopy  2. ESRD - reg treatment days TTS- plan on HD tomorrow on schedule via TDC  3. Anemia- GIB as above - iron low,  repleting.  mircera given 7/7- will give aranesp tomorrow as well  4. Secondary hyperparathyroidism- hectorol ordered - latest phos was 7/2- was OK - was on phoslo as OP , will add  5. HTN/volume- is fine- on low dose coreg   Louis Bradley    Labs: Basic Metabolic Panel: Recent Labs  Lab 05/25/19 1642 05/27/19 0752 05/28/19 0215  NA 138 138 137  K 3.6 3.9 4.1  CL 98 103 102  CO2 30 26 26   GLUCOSE 163* 136* 119*  BUN 46* 41* 47*  CREATININE 4.35* 3.80* 4.28*  CALCIUM 8.1* 8.1* 8.0*   Liver Function Tests: Recent Labs  Lab 05/25/19 1642  AST 20  ALT 29  ALKPHOS 51  BILITOT 0.4  PROT 5.7*  ALBUMIN 3.1*   Recent Labs   Lab 05/25/19 1642  LIPASE 34   Recent Labs  Lab 05/25/19 1644  AMMONIA 13   CBC: Recent Labs  Lab 05/25/19 1642  05/27/19 0752 05/27/19 1805 05/28/19 0215  WBC 12.1*  --  8.3  --  9.6  NEUTROABS 10.0*  --   --   --  7.7  HGB 6.2*   < > 7.2* 7.8* 7.4*  HCT 19.8*   < > 23.1* 24.8* 23.2*  MCV 97.5  --  95.1  --  94.3  PLT 149*  --  139*  --  138*   < > = values in this interval not displayed.   Cardiac Enzymes: No results for input(s): CKTOTAL, CKMB, CKMBINDEX, TROPONINI in the last 168 hours. CBG: Recent Labs  Lab 05/27/19 1709 05/27/19 2005 05/28/19 0005 05/28/19 0350 05/28/19 0758  GLUCAP 161* 138* 108* 120* 140*    Iron Studies:  Recent Labs    05/28/19 0215  IRON 16*  TIBC 279   Studies/Results: No results found. Medications: Infusions: . ferric gluconate (FERRLECIT/NULECIT) IV 125 mg (05/26/19 2145)  . pantoprozole (PROTONIX) infusion 8 mg/hr (  05/28/19 1144)    Scheduled Medications: . carvedilol  3.125 mg Oral BID WC  . [START ON 05/29/2019] darbepoetin (ARANESP) injection - DIALYSIS  60 mcg Intravenous Q Tue-HD  . [START ON 05/29/2019] doxercalciferol  1 mcg Intravenous Q T,Th,Sa-HD  . [START ON 05/29/2019] pantoprazole  40 mg Intravenous Q12H    have reviewed scheduled and prn medications.  Physical Exam: General: NAD Heart: RRR Lungs: moslty clear Abdomen: soft, non tender Extremities: no edema Dialysis Access: TDC but also left upper arm AVF     05/28/2019,12:12 PM  LOS: 3 days

## 2019-05-28 NOTE — Procedures (Signed)
Small bowel pill capsule report.  Indication: 73 year old male with melena and anemia, hemoglobin 6.2 on admission, recent EGD from 05/13/2019 showed gastritis and duodenitis, colonoscopy a few months ago at an outside facility was reported unremarkable as per patient.  He has history of end-stage renal disease with hemodialysis, diabetes, Fournier's gangrene, moderate aortic stenosis, obesity, obstructive sleep apnea on CPAP, hypertension, dyslipidemia, left toe osteomyelitis, recent NSTEMI, pulmonary hypertension and atrial fibrillation not on anticoagulation.  Procedure and findings: After obtaining an informed consent, the patient was advised to swallow a PillCam which was noted in the stomach at 1 minute, into the duodenum at almost 2 minutes.  The study ended at 11 hours and 57 minutes and it is unclear if the PillCam exited into the cecum as lumen is obscured by dark, black fluid interfering with visualization. There is evidence of erythematous mucosa in the stomach and duodenal bulb with likely a clean-based ulcer in the duodenum noted at 1 minute and 54 seconds. Starting at 4 minutes, there is evidence of fresh blood and clots but underlying cause for active/recent GI bleed is not evident, possibly AVM versus ulcer, less likely mass. Fresh blood and clot is noted up to 1 hour and 45 minutes into the study, after which, the lumen is covered with black fluid and visualization is interfered until the end of the study.  Summary and recommendations: Fresh blood and clot noted in small bowel starting at 7 minutes into the study, possibly in distal duodenum versus proximal jejunum.  Underlying mucosal lesion is not evident, possibly AVM or ulcer, less likely a mass. Recommend diagnostic push enteroscopy Keep patient on clear liquid diet and n.p.o. post midnight, PPI twice daily and monitor H&H with the intention to transfuse PRBC if hemoglobin is less than 7 or patient has hemodynamic  instability.   Ronnette Juniper, MD

## 2019-05-28 NOTE — Progress Notes (Signed)
Patient already on NIV on arrival tolerating it well. Nasal mask being used

## 2019-05-28 NOTE — Progress Notes (Signed)
Renal Navigator notified OP HD clinic/SW of patient's admission and negative COVID 19 test result in order to provide continuity of care.  Alphonzo Cruise, [place] Renal Navigator [phone]

## 2019-05-28 NOTE — Progress Notes (Addendum)
Progress Note  Patient Name: Louis Bradley Date of Encounter: 05/28/2019  Primary Cardiologist: Candee Furbish, MD  Subjective   Fortunately asymptomatic. No CP, SOB, palpitations. Resting comfortably on nasal mask CPAP.  Inpatient Medications    Scheduled Meds: . carvedilol  3.125 mg Oral BID WC  . [START ON 05/29/2019] darbepoetin (ARANESP) injection - DIALYSIS  60 mcg Intravenous Q Tue-HD  . [START ON 05/29/2019] doxercalciferol  1 mcg Intravenous Q T,Th,Sa-HD  . [START ON 05/29/2019] pantoprazole  40 mg Intravenous Q12H   Continuous Infusions: . ferric gluconate (FERRLECIT/NULECIT) IV 125 mg (05/26/19 2145)  . pantoprozole (PROTONIX) infusion 8 mg/hr (05/28/19 0050)   PRN Meds: acetaminophen **OR** acetaminophen, ondansetron **OR** ondansetron (ZOFRAN) IV   Vital Signs    Vitals:   05/28/19 0028 05/28/19 0352 05/28/19 0554 05/28/19 0800  BP:  (!) 113/36  122/61  Pulse: 74 75    Resp: 19 17    Temp:  98.2 F (36.8 C)  99.1 F (37.3 C)  TempSrc:  Oral  Axillary  SpO2: 97% 100%    Weight:   127.7 kg   Height:        Intake/Output Summary (Last 24 hours) at 05/28/2019 0831 Last data filed at 05/28/2019 0000 Gross per 24 hour  Intake 120 ml  Output -  Net 120 ml   Last 3 Weights 05/28/2019 05/27/2019 05/26/2019  Weight (lbs) 281 lb 8.4 oz 126 lb 8 oz 277 lb 12.5 oz  Weight (kg) 127.7 kg 57.38 kg 126 kg     Telemetry    Atrial flutter with intermittent RVR - Personally Reviewed  Physical Exam   GEN: obese pale WM in no acute distress. Resting nearly flat in bed without orthopnea HEENT: Normocephalic, atraumatic, sclera non-icteric. Neck: No JVD or bruits. Cardiac: Somewhat regularly, tachycardic, no murmurs, rubs, or gallops.  Radials/DP/PT 1+ and equal bilaterally.  Respiratory: Clear to auscultation bilaterally. Breathing is unlabored. GI: Soft, nontender, non-distended, BS +x 4. MS: no deformity. Extremities: No clubbing or cyanosis. Mild pale LE edema.  Distal pedal pulses are 2+ and equal bilaterally. Neuro:  AAOx3. Follows commands. Psych:  Responds to questions appropriately with a normal affect.  Labs    Chemistry Recent Labs  Lab 05/25/19 1642 05/27/19 0752 05/28/19 0215  NA 138 138 137  K 3.6 3.9 4.1  CL 98 103 102  CO2 30 26 26   GLUCOSE 163* 136* 119*  BUN 46* 41* 47*  CREATININE 4.35* 3.80* 4.28*  CALCIUM 8.1* 8.1* 8.0*  PROT 5.7*  --   --   ALBUMIN 3.1*  --   --   AST 20  --   --   ALT 29  --   --   ALKPHOS 51  --   --   BILITOT 0.4  --   --   GFRNONAA 13* 15* 13*  GFRAA 15* 17* 15*  ANIONGAP 10 9 9      Hematology Recent Labs  Lab 05/25/19 1642  05/27/19 0752 05/27/19 1805 05/28/19 0215  WBC 12.1*  --  8.3  --  9.6  RBC 2.03*  --  2.43*  --  2.46*  HGB 6.2*   < > 7.2* 7.8* 7.4*  HCT 19.8*   < > 23.1* 24.8* 23.2*  MCV 97.5  --  95.1  --  94.3  MCH 30.5  --  29.6  --  30.1  MCHC 31.3  --  31.2  --  31.9  RDW 17.1*  --  17.6*  --  17.2*  PLT 149*  --  139*  --  138*   < > = values in this interval not displayed.    Cardiac EnzymesNo results for input(s): TROPONINI in the last 168 hours. No results for input(s): TROPIPOC in the last 168 hours.   BNPNo results for input(s): BNP, PROBNP in the last 168 hours.   DDimer No results for input(s): DDIMER in the last 168 hours.   Radiology    No results found.  Cardiac Studies   2D echo 05/11/19 IMPRESSIONS    1. The left ventricle has a visually estimated ejection fraction of 50%. The cavity size was mildly dilated. Left ventricular diastolic Doppler parameters are consistent with pseudonormalization. Elevated left ventricular end-diastolic pressure.  Hypokinesis of the apical anterior wall, the apical septal wall, and the true apex.  2. The right ventricle has normal systolic function. The cavity was normal. There is no increase in right ventricular wall thickness.  3. Left atrial size was moderately dilated.  4. There is moderate mitral annular  calcification present. No evidence of mitral valve stenosis. Mild mitral regurgitation.  5. The aortic valve is tricuspid. Moderate calcification of the aortic valve. Moderate stenosis of the aortic valve. Mean gradient 33 mmHg with AVA 1.29 cm^2.  6. The aortic root is normal in size and structure.  7. Right atrial size was mildly dilated.  8. Trivial pericardial effusion is present.  9. The inferior vena cava was dilated in size with <50% respiratory variability. PA systolic pressure 53 mmHg.   Patient Profile     73 y.o. male with recent progression to ESRD with initiation of HD, DM, Fournier gangrene, moderate AS by echo 04/2019, obesity, OSA on CPAP, HTN, HLD, L toe osteomyelitis, anemia, arthritis, recent NSTEMI, acute on chronic anemia and suspected GIB, pulmonary HTN by echo 04/2019, atrial fibrillation dx 04/2019. During his June and early July admissions he had elevated troponin suspicious for NSTEMI but not felt an active candidate for invasive eval given ongoing GIB/anemia issues. EGD 04/2019 showed gastritis/duodenitis without acute bleeding. Had had colonoscopy 6-8 months prior so this was not pursued. He was also noted to have new paroxysms of atrial fib, asymptomatic - no anticoag given GIB.  He was readmitted 05/25/2019 with hypotension and recurrent anemia requiring repeat transfusion. Found to have hsTroponin >5000. Also with persistent atrial fib and paroxysmal atrial flutter as well. Capsule endo under way.  Assessment & Plan    1. Recurrent symptomatic anemia with hypotension and GI bleeding - continues to be significant issue at this point requiring numerous transfusions to remain hemodynamically stable. Increased bleeding risk noted with HD. Capsule endoscopy under way and patient on Protonix. Despite transfusion, last Hgb remains in the 7's.  2. Persistent atrial fib/flutter - rate controlled presently but with intermittent tachycardia, likely driven by underlying physiologic  stressors. Will discuss with MD whether we should consider changing carvedilol to metoprolol to address HR without impacting BP as much. Not a candidate for anticoag at this point given bleeding issues. Continue CPAP for OSA.   3. Elevated troponin (recent recurrent NSTEMIs) - certainly concerning for underlying CAD but with numerous physiologic stressors. Not presently a candidate for invasive ischemic eval due to inability to use antiplatelets. We would recommend to see several weeks of stable hemoglobin before proceeding with diagnostic angiography. LV function relatively preserved by last echo. However, given degree of troponin elevation will review with MD whether repeat limited echo to assess stability of LV function would be  worthwhile. Would not presently change management however.  4. ESRD - now on HD per renal.  5. Aortic stenosis - at least moderate by echocardiogram. Follow clinically for now.  For questions or updates, please contact [place] Please consult [email or web address] for contact info under Cardiology/STEMI.  Signed, Charlie Pitter, PA-C 05/28/2019, 8:31 AM    ---------------------------------------------------------------------------------------------   History and all data above reviewed.  Patient examined.  I agree with the findings as above.  Louis Bradley is feeling well with no specific cardiovascular concerns today.  Constitutional: No acute distress ENMT: normal dentition, moist mucous membranes Cardiovascular: regular rhythm, normal rate, no murmurs. S1 and S2 normal. Radial pulses normal bilaterally. No jugular venous distention.  Respiratory: clear to auscultation bilaterally GI : normal bowel sounds, soft and nontender. No distention.   MSK: extremities warm, well perfused. Trace edema.  NEURO: grossly nonfocal exam, moves all extremities. PSYCH: alert and oriented x 3, normal mood and affect.   All available labs, radiology testing, previous records  reviewed. Agree with documented assessment and plan of my colleague as stated above with the following additions or changes:  Principal Problem:   Acute GI bleeding Active Problems:   Chronic renal failure in pediatric patient, stage 4 (severe) ([place])   Diabetes mellitus type 2 with complications ([place])   Atrial fibrillation ([place])   Pulmonary hypertension ([place])   ESRD (end stage renal disease) ([place])    Plan: agree with above. Will await further information from capsule study. Can consider transition to metoprolol for rate control instead of carvedilol. He is tolerating carvedilol well today.   Time Spent Directly with Patient:  I have spent a total of 25 minutes with the patient reviewing hospital notes, telemetry, EKGs, labs and examining the patient as well as establishing an assessment and plan that was discussed personally with the patient.  > 50% of time was spent in direct patient care.  Length of Stay:  LOS: 3 days   Elouise Munroe, MD [facility] 5:14 PM  05/28/2019

## 2019-05-28 NOTE — Care Management Important Message (Signed)
Important Message  Patient Details  Name: Louis Bradley MRN: [id number] Date of Birth: [birth year]   Medicare Important Message Given:  Yes     Miah Boye [address] 05/28/2019, 4:41 PM

## 2019-05-28 NOTE — Evaluation (Signed)
Physical Therapy Evaluation Patient Details Name: Louis Bradley MRN: [id number] DOB: 15-Mar-1946 Today's Date: 05/28/2019   History of Present Illness  Pt is a 73 y.o. male admitted 05/25/19 with hypotension and low hemoglobin; worked up for recurrent GIB; also with persistent afib/flutter. Of note, pt recently discharged 05/18/19 after admission with GIB, NSTEMI, PNA, UTI. PMH includes ESRD, HTN, DM2, afib.    Clinical Impression  Pt presents with an overall decrease in functional mobility secondary to above. PTA, pt independent with mobility, had started first session with HHPT since recent hospital d/c. Today, pt ambulatory with intermittent min guard due to balance deficits. HR 109-145 with mobility; pt easily fatigued. Pt would benefit from continued acute PT services to maximize functional mobility and independence prior to d/c with continued HHPT services.     Follow Up Recommendations Home health PT;Supervision for mobility/OOB    Equipment Recommendations  None recommended by PT    Recommendations for Other Services       Precautions / Restrictions Precautions Precautions: Fall;Other (comment) Precaution Comments: Watch HR - tachycardic Restrictions Weight Bearing Restrictions: No      Mobility  Bed Mobility Overal bed mobility: Modified Independent             General bed mobility comments: HOB elevated  Transfers Overall transfer level: Needs assistance Equipment used: None Transfers: Sit to/from Stand Sit to Stand: Independent         General transfer comment: supervision for lines and safety  Ambulation/Gait Ambulation/Gait assistance: Min guard;Supervision Gait Distance (Feet): 80 Feet Assistive device: IV Pole Gait Pattern/deviations: Step-through pattern;Decreased stride length;Drifts right/left Gait velocity: Decreased   General Gait Details: Ambulated laps in room with IV pole and intermittent min guard for balance due to pt feeling unsteady;  intermittently reaching to furniture for added support. Reports first time up walking since admission. HR up to 145; pt fatigued  Stairs            Wheelchair Mobility    Modified Rankin (Stroke Patients Only)       Balance Overall balance assessment: Needs assistance Sitting-balance support: Feet supported Sitting balance-Leahy Scale: Good       Standing balance-Leahy Scale: Fair                               Pertinent Vitals/Pain Pain Assessment: Faces Faces Pain Scale: Hurts a little bit Pain Location: R shoulder with ROM Pain Descriptors / Indicators: Discomfort Pain Intervention(s): Monitored during session    Home Living Family/patient expects to be discharged to:: Private residence Living Arrangements: Spouse/significant other Available Help at Discharge: Family;Available 24 hours/day Type of Home: House Home Access: Level entry     Home Layout: One level Home Equipment: Transport planner;Shower seat - built in      Prior Function Level of Independence: Independent         Comments: scooter occasionally for long distance     Hand Dominance   Dominant Hand: Right    Extremity/Trunk Assessment   Upper Extremity Assessment Upper Extremity Assessment: RUE deficits/detail RUE Deficits / Details: Reports shoulder pain since fall when he missed sitting on toilet at hospital last admission; able to perform shoulder rolls/shrugs, abd/flex limited due to pain. Wrist/elbow/hand WFL    Lower Extremity Assessment Lower Extremity Assessment: Generalized weakness(h/o toe amputations)    Cervical / Trunk Assessment Cervical / Trunk Assessment: Other exceptions Cervical / Trunk Exceptions: rounded shoulders  Communication  Communication: No difficulties  Cognition Arousal/Alertness: Awake/alert Behavior During Therapy: WFL for tasks assessed/performed Overall Cognitive Status: Within Functional Limits for tasks assessed                                         General Comments      Exercises     Assessment/Plan    PT Assessment Patient needs continued PT services  PT Problem List Decreased mobility;Decreased activity tolerance;Decreased balance;Decreased knowledge of use of DME;Decreased strength       PT Treatment Interventions DME instruction;Therapeutic activities;Gait training;Therapeutic exercise;Patient/family education;Balance training;Functional mobility training;Stair training    PT Goals (Current goals can be found in the Care Plan section)  Acute Rehab PT Goals Patient Stated Goal: return home to reading and travel PT Goal Formulation: With patient Time For Goal Achievement: 06/11/19 Potential to Achieve Goals: Good    Frequency Min 3X/week   Barriers to discharge        Co-evaluation               AM-PAC PT "6 Clicks" Mobility  Outcome Measure Help needed turning from your back to your side while in a flat bed without using bedrails?: None Help needed moving from lying on your back to sitting on the side of a flat bed without using bedrails?: None Help needed moving to and from a bed to a chair (including a wheelchair)?: A Little Help needed standing up from a chair using your arms (e.g., wheelchair or bedside chair)?: A Little Help needed to walk in hospital room?: A Little Help needed climbing 3-5 steps with a railing? : A Little 6 Click Score: 20    End of Session Equipment Utilized During Treatment: Gait belt Activity Tolerance: Patient tolerated treatment well Patient left: in bed;with call bell/phone within reach Nurse Communication: Mobility status PT Visit Diagnosis: Other abnormalities of gait and mobility (R26.89)    Time: 1001-1019 PT Time Calculation (min) (ACUTE ONLY): 18 min   Charges:   PT Evaluation $PT Eval Moderate Complexity: [place], PT, DPT Acute Rehabilitation Services  Pager [phone] Office [place] 05/28/2019, 10:54 AM

## 2019-05-28 NOTE — Progress Notes (Signed)
Spoke with MD Pahwani regarding whether or not PRBC's should be administered. I did let him know GI doctor said in note to give blood if Hgb below 7.0, but MD Pahwani did still want it hung today with a Hgb of 7.4.

## 2019-05-29 ENCOUNTER — Encounter (HOSPITAL_COMMUNITY)
Admission: EM | Disposition: A | Discharge status: Home Health | Payer: Self-pay | Source: Home / Self Care | Attending: Family Medicine

## 2019-05-29 ENCOUNTER — Inpatient Hospital Stay (HOSPITAL_COMMUNITY): Payer: Medicare Other

## 2019-05-29 ENCOUNTER — Encounter (HOSPITAL_COMMUNITY): Payer: Self-pay | Admitting: *Deleted

## 2019-05-29 ENCOUNTER — Ambulatory Visit: Payer: Medicare Other | Admitting: Cardiology

## 2019-05-29 ENCOUNTER — Inpatient Hospital Stay (HOSPITAL_COMMUNITY): Payer: Medicare Other | Admitting: Anesthesiology

## 2019-05-29 HISTORY — PX: ENTEROSCOPY: SHX5533

## 2019-05-29 HISTORY — PX: BIOPSY: SHX5522

## 2019-05-29 LAB — GLUCOSE, CAPILLARY
Glucose-Capillary: 158 mg/dL — ABNORMAL HIGH (ref 70–99)
Glucose-Capillary: 124 mg/dL — ABNORMAL HIGH (ref 70–99)
Glucose-Capillary: 138 mg/dL — ABNORMAL HIGH (ref 70–99)
Glucose-Capillary: 126 mg/dL — ABNORMAL HIGH (ref 70–99)
Glucose-Capillary: 107 mg/dL — ABNORMAL HIGH (ref 70–99)
Glucose-Capillary: 160 mg/dL — ABNORMAL HIGH (ref 70–99)

## 2019-05-29 LAB — HEMOGLOBIN AND HEMATOCRIT, BLOOD
HCT: 24.9 % — ABNORMAL LOW (ref 39.0–52.0)
Hemoglobin: 8.1 g/dL — ABNORMAL LOW (ref 13.0–17.0)

## 2019-05-29 SURGERY — ENTEROSCOPY
Anesthesia: Monitor Anesthesia Care

## 2019-05-29 MED ORDER — IOHEXOL 300 MG/ML  SOLN
100.0000 mL | Freq: Once | INTRAMUSCULAR | Status: AC | PRN
  Administered 2019-05-29: 100 mL via INTRAVENOUS

## 2019-05-29 MED ORDER — HEPARIN SODIUM (PORCINE) 1000 UNIT/ML IJ SOLN
INTRAMUSCULAR | Status: AC
  Administered 2019-05-29: 3400 [IU]
  Filled 2019-05-29: qty 4

## 2019-05-29 MED ORDER — SODIUM CHLORIDE 0.9 % IV SOLN
INTRAVENOUS | Status: DC | PRN
  Administered 2019-05-29: 25 ug/min via INTRAVENOUS

## 2019-05-29 MED ORDER — PROPOFOL 500 MG/50ML IV EMUL
INTRAVENOUS | Status: DC | PRN
  Administered 2019-05-29: 100 ug/kg/min via INTRAVENOUS

## 2019-05-29 MED ORDER — DOXERCALCIFEROL 4 MCG/2ML IV SOLN
INTRAVENOUS | Status: AC
  Administered 2019-05-29: 1 ug via INTRAVENOUS
  Filled 2019-05-29: qty 2

## 2019-05-29 MED ORDER — SODIUM CHLORIDE 0.9 % IV SOLN
INTRAVENOUS | Status: DC
  Administered 2019-05-29: 08:00:00 via INTRAVENOUS

## 2019-05-29 MED ORDER — PROPOFOL 10 MG/ML IV BOLUS
INTRAVENOUS | Status: DC | PRN
  Administered 2019-05-29 (×3): 10 mg via INTRAVENOUS

## 2019-05-29 MED ORDER — DARBEPOETIN ALFA 150 MCG/0.3ML IJ SOSY
PREFILLED_SYRINGE | INTRAMUSCULAR | Status: AC
  Administered 2019-05-29: 150 ug via INTRAVENOUS
  Filled 2019-05-29: qty 0.3

## 2019-05-29 NOTE — H&P (Signed)
Patient in endo for endoscopy to evaluate ugi bleed.  PE no change IMP ugi bleed, positive capsule study Plan EGD with enteroscopy.

## 2019-05-29 NOTE — Transfer of Care (Signed)
Immediate Anesthesia Transfer of Care Note  Patient: Louis Bradley  Procedure(s) Performed: ENTEROSCOPY (N/A ) BIOPSY  Patient Location: Endoscopy Unit  Anesthesia Type:MAC  Level of Consciousness: awake and alert   Airway & Oxygen Therapy: Patient Spontanous Breathing and Patient connected to nasal cannula oxygen  Post-op Assessment: Report given to RN and Post -op Vital signs reviewed and stable  Post vital signs: Reviewed and stable  Last Vitals:  Vitals Value Taken Time  BP 105/47 05/29/19 0949  Temp    Pulse 82 05/29/19 0949  Resp 21 05/29/19 0949  SpO2 99 % 05/29/19 0949    Last Pain:  Vitals:   05/29/19 0945  TempSrc:   PainSc: 0-No pain         Complications: No apparent anesthesia complications

## 2019-05-29 NOTE — Anesthesia Preprocedure Evaluation (Addendum)
Anesthesia Evaluation  Patient identified by MRN, date of birth, ID band Patient awake    Reviewed: Allergy & Precautions, NPO status , Patient's Chart, lab work & pertinent test results  Airway Mallampati: III  TM Distance: >3 FB Neck ROM: Full    Dental  (+) Teeth Intact, Dental Advisory Given   Pulmonary sleep apnea and Continuous Positive Airway Pressure Ventilation , former smoker,    breath sounds clear to auscultation       Cardiovascular hypertension, Pt. on home beta blockers + Past MI  + Valvular Problems/Murmurs AS  Rhythm:Regular Rate:Normal + Systolic murmurs    Neuro/Psych negative neurological ROS     GI/Hepatic   Endo/Other  diabetes, Type 2, Oral Hypoglycemic Agents  Renal/GU ESRF and DialysisRenal disease     Musculoskeletal  (+) Arthritis ,   Abdominal Normal abdominal exam  (+)   Peds  Hematology   Anesthesia Other Findings   Reproductive/Obstetrics                            Anesthesia Physical Anesthesia Plan  ASA: III  Anesthesia Plan: MAC   Post-op Pain Management:    Induction: Intravenous  PONV Risk Score and Plan: 0 and Propofol infusion  Airway Management Planned: Natural Airway and Simple Face Mask  Additional Equipment: None  Intra-op Plan:   Post-operative Plan:   Informed Consent: I have reviewed the patients History and Physical, chart, labs and discussed the procedure including the risks, benefits and alternatives for the proposed anesthesia with the patient or authorized representative who has indicated his/her understanding and acceptance.       Plan Discussed with: CRNA  Anesthesia Plan Comments:        Anesthesia Quick Evaluation

## 2019-05-29 NOTE — Progress Notes (Addendum)
PROGRESS NOTE    Louis Bradley  LKG:[id number] DOB: Mar 18, 1946 DOA: 05/25/2019 PCP: Colon Branch, MD   Brief Narrative:  Louis Bradley is a 73 y.o. male with history of chronic kidney disease recently started on hemodialysis during last admission discharged on May 17, 2029 a week ago after being admitted for shortness of breath was found to have acute GI bleed after he was found to have gastritis and duodenitis but no active bleed to EGD that was done on 6/28.  During that hospitalization patient also underwent dialysis catheter placement and dialysis was started had gone for his routine dialysis on Thursday, 05/24/2019 and had labs done which showed a low hemoglobin of 6.2 and was advised to come to the ER.  Patient denied any chest pain shortness of breath nausea vomiting abdominal pain or diarrhea.  During last admission patient also had non-ST elevation MI and was at this time conservatively managed due to patient's acute GI bleed.  In the ER patient was hypotensive with blood pressure in the 66Y systolic was given 1 L fluid bolus. Hemoglobin was 6.2 on-call [facility] gastroenterologist was consulted patient started on Protonix and 3 units of PRBC transfusion ordered.  EKG shows possible A. fib with ST depression in the lateral leads which are comparable to the old EKG. patient did not have any signs or symptoms of ACS.  He had elevated troponin, more than previous 1 so cardiology was consulted by admitting physician.  GI and nephrology has been on board as well.  Patient's hemoglobin has remained stable just over 7.  Due to him having intermittent atrial fibrillation with RVR, he was transfused 1 more unit of blood on 05/27/2019 which improved his hemoglobin to 7.8 only to drop again to 7.4 today.  Completed capsule endoscopy which shows possibility of small bowel bleed.  Underwent EGD with push enteroscopy today and was found to be having duodenal mass and duodenal nodule with some erythema in the  stomach and some duodenopathy.  Consultants:   Nephrology  GI  Cardiology  Procedures:   None  Antimicrobials:   None   Subjective: Patient seen and examined after he returned from an EGD.  He was still slightly groggy.  He tells me that GI is going to a CT of the abdomen pelvis.  He had no complaint.  Objective: Vitals:   05/29/19 0949 05/29/19 0954 05/29/19 1005 05/29/19 1104  BP: (!) 105/47 101/75 (!) 135/49 (!) 124/52  Pulse: 82 95 85 82  Resp: (!) 21 (!) 23 19 17   Temp:      TempSrc:      SpO2: 99% 96% 97% 99%  Weight:      Height:        Intake/Output Summary (Last 24 hours) at 05/29/2019 1141 Last data filed at 05/29/2019 0943 Gross per 24 hour  Intake 872 ml  Output --  Net 872 ml   Filed Weights   05/26/19 2245 05/27/19 0600 05/28/19 0554  Weight: 126 kg 57.4 kg 127.7 kg    Examination: General exam: Appears calm and comfortable  Respiratory system: Clear to auscultation. Respiratory effort normal. Cardiovascular system: S1 & S2 heard, RRR. No JVD, murmurs, rubs, gallops or clicks. No pedal edema. Gastrointestinal system: Abdomen is nondistended, soft and nontender. No organomegaly or masses felt. Normal bowel sounds heard. Central nervous system: Alert and oriented. No focal neurological deficits. Extremities: Symmetric 5 x 5 power. Skin: No rashes, lesions or ulcers Psychiatry: Judgement and insight appear  normal. Mood & affect appropriate.   Data Reviewed: I have personally reviewed following labs and imaging studies  CBC: Recent Labs  Lab 05/25/19 1642  05/26/19 1649 05/27/19 0752 05/27/19 1805 05/28/19 0215 05/28/19 1829  WBC 12.1*  --   --  8.3  --  9.6  --   NEUTROABS 10.0*  --   --   --   --  7.7  --   HGB 6.2*   < > 7.5* 7.2* 7.8* 7.4* 8.3*  HCT 19.8*   < > 23.2* 23.1* 24.8* 23.2* 26.1*  MCV 97.5  --   --  95.1  --  94.3  --   PLT 149*  --   --  139*  --  138*  --    < > = values in this interval not displayed.   Basic  Metabolic Panel: Recent Labs  Lab 05/25/19 1642 05/27/19 0752 05/28/19 0215  NA 138 138 137  K 3.6 3.9 4.1  CL 98 103 102  CO2 30 26 26   GLUCOSE 163* 136* 119*  BUN 46* 41* 47*  CREATININE 4.35* 3.80* 4.28*  CALCIUM 8.1* 8.1* 8.0*   GFR: Estimated Creatinine Clearance: 21.8 mL/min (A) (by C-G formula based on SCr of 4.28 mg/dL (H)). Liver Function Tests: Recent Labs  Lab 05/25/19 1642  AST 20  ALT 29  ALKPHOS 51  BILITOT 0.4  PROT 5.7*  ALBUMIN 3.1*   Recent Labs  Lab 05/25/19 1642  LIPASE 34   Recent Labs  Lab 05/25/19 1644  AMMONIA 13   Coagulation Profile: No results for input(s): INR, PROTIME in the last 168 hours. Cardiac Enzymes: No results for input(s): CKTOTAL, CKMB, CKMBINDEX, TROPONINI in the last 168 hours. BNP (last 3 results) No results for input(s): PROBNP in the last 8760 hours. HbA1C: No results for input(s): HGBA1C in the last 72 hours. CBG: Recent Labs  Lab 05/28/19 1724 05/28/19 2041 05/29/19 0135 05/29/19 0446 05/29/19 0809  GLUCAP 133* 152* 158* 124* 138*   Lipid Profile: No results for input(s): CHOL, HDL, LDLCALC, TRIG, CHOLHDL, LDLDIRECT in the last 72 hours. Thyroid Function Tests: No results for input(s): TSH, T4TOTAL, FREET4, T3FREE, THYROIDAB in the last 72 hours. Anemia Panel: Recent Labs    05/28/19 0215  TIBC 279  IRON 16*   Sepsis Labs: Recent Labs  Lab 05/25/19 1644  LATICACIDVEN 1.3    Recent Results (from the past 240 hour(s))  SARS Coronavirus 2 (CEPHEID - Performed in [place] hospital lab), Hosp Order     Status: None   Collection Time: 05/25/19  7:11 PM   Specimen: Nasopharyngeal Swab  Result Value Ref Range Status   SARS Coronavirus 2 NEGATIVE NEGATIVE Final    Comment: (NOTE) If result is NEGATIVE SARS-CoV-2 target nucleic acids are NOT DETECTED. The SARS-CoV-2 RNA is generally detectable in upper and lower  respiratory specimens during the acute phase of infection. The lowest    concentration of SARS-CoV-2 viral copies this assay can detect is 250  copies / mL. A negative result does not preclude SARS-CoV-2 infection  and should not be used as the sole basis for treatment or other  patient management decisions.  A negative result may occur with  improper specimen collection / handling, submission of specimen other  than nasopharyngeal swab, presence of viral mutation(s) within the  areas targeted by this assay, and inadequate number of viral copies  (<250 copies / mL). A negative result must be combined with clinical  observations, patient  history, and epidemiological information. If result is POSITIVE SARS-CoV-2 target nucleic acids are DETECTED. The SARS-CoV-2 RNA is generally detectable in upper and lower  respiratory specimens dur ing the acute phase of infection.  Positive  results are indicative of active infection with SARS-CoV-2.  Clinical  correlation with patient history and other diagnostic information is  necessary to determine patient infection status.  Positive results do  not rule out bacterial infection or co-infection with other viruses. If result is PRESUMPTIVE POSTIVE SARS-CoV-2 nucleic acids MAY BE PRESENT.   A presumptive positive result was obtained on the submitted specimen  and confirmed on repeat testing.  While 2019 novel coronavirus  (SARS-CoV-2) nucleic acids may be present in the submitted sample  additional confirmatory testing may be necessary for epidemiological  and / or clinical management purposes  to differentiate between  SARS-CoV-2 and other Sarbecovirus currently known to infect humans.  If clinically indicated additional testing with an alternate test  methodology [phone]) is advised. The SARS-CoV-2 RNA is generally  detectable in upper and lower respiratory sp ecimens during the acute  phase of infection. The expected result is Negative. Fact Sheet for Patients:  StrictlyIdeas.no Fact Sheet  for Healthcare Providers: BankingDealers.co.za This test is not yet approved or cleared by the [place] FDA and has been authorized for detection and/or diagnosis of SARS-CoV-2 by FDA under an Emergency Use Authorization (EUA).  This EUA will remain in effect (meaning this test can be used) for the duration of the COVID-19 declaration under Section 564(b)(1) of the Act, 21 U.S.C. section 360bbb-3(b)(1), unless the authorization is terminated or revoked sooner. Performed at [facility], [place] [address], [place], [place] [address]       Radiology Studies: No results found.  Scheduled Meds:  sodium chloride   Intravenous Once   sodium chloride   Intravenous Once   calcium acetate  667 mg Oral TID WC   carvedilol  3.125 mg Oral BID WC   Chlorhexidine Gluconate Cloth  6 each Topical Q0600   darbepoetin (ARANESP) injection - DIALYSIS  150 mcg Intravenous Q Tue-HD   doxercalciferol  1 mcg Intravenous Q T,Th,Sa-HD   pantoprazole  40 mg Intravenous Q12H   Continuous Infusions:  ferric gluconate (FERRLECIT/NULECIT) IV 125 mg (05/29/19 1121)     LOS: 4 days   Assessment & Plan:   Principal Problem:   Acute GI bleeding Active Problems:   Chronic renal failure in pediatric patient, stage 4 (severe) (HCC)   Diabetes mellitus type 2 with complications ([place])   Atrial fibrillation ([place])   Pulmonary hypertension ([place])   ESRD (end stage renal disease) ([place])   Symptomatic anemia/acute blood loss anemia/possible upper GI bleed/hypotension: Patient has received total of 4 units of PRBC transfusion so far with the last one on 05/28/2019 when his hemoglobin was 7.4.  Current hemoglobin 8.2.  My goal is to keep hemoglobin over 8 due to intermittent atrial fibrillation with RVR. He underwent capsule endoscopy which showed possibility of small bowel bleeding as a follow-up, he underwent EGD with push enteroscopy which shows erythematous gastropathy and  duodenopathy and duodenal mass.  GI planning on doing CT abdomen today.  Monitor H&H closely.  Continue Protonix twice daily.  Recent non-ST elevation MI/ST depression on EKG/elevated troponin: He has no signs or symptoms of chest pain or shortness of breath.  This is likely type II MI.  Continue to monitor.  No aspirin or Plavix due to acute active GI bleed.  Per cardiology,  he is not a good candidate for cardiac catheterization since he will not be able to tolerate any antiplatelet if he were to require any stents.  Cardiology on board and defer further management to them.  CKD stage V on hemodialysis: Nephrology on board.  Type 2 diabetes mellitus: Hold oral hypoglycemics.  Start on SSI.  Blood sugar control.  OSA on CPAP: Nightly CPAP.  Atrial fibrillation with RVR: Has been in and out of RVR.  Due to improved blood pressure, I resumed his carvedilol yesterday and now his heart rate has remained controlled.  Cardiology has seen him and per their note, they are going to think about switching him to short acting Lopressor.  Will defer further management to cardiology.  DVT prophylaxis: SCD/avoiding heparin products due to active GI bleed. Code Status: Full code Family Communication: Spoke to his wife over the phone on speaker in his presence on 05/28/2019 When I was seeing him.  Answered several questions.  Will defer to GI to call her directly to answer her questions. Disposition Plan: To be determined   Time spent: 27 minutes   Darliss Cheney, MD [facility] Pager [phone]  If 7PM-7AM, please contact night-coverage [email or web address] Password [facility] 05/29/2019, 11:41 AM

## 2019-05-29 NOTE — Progress Notes (Signed)
Pt's wife updated with POC. All questions/concerns addressed. Will continue to monitor.   Pt's daughter also updated after wife's call. Questions/concerns addressed.

## 2019-05-29 NOTE — Progress Notes (Signed)
Chart reviewed. Patient in endoscopy this morning.   No change from cardiovascular standpoint. Blood pressure acceptable overnight. Rates controlled.   Cardiology will follow along as needed awaiting further results of GI bleeding workup.

## 2019-05-29 NOTE — Progress Notes (Signed)
Patient had 100 mls Omni 300 IV extravasation in right antecubital. Dr Ardeen Garland assessed pt, IV was removed, and verbal instruction given to pt. RN, Sherlynn Stalls, notified by phone. Extravasation orders placed and RN alerted. Per Dr Ardeen Garland, ordering MD should consider delaying repeat scan until day of next dialysis.  Will contact ordering MD to notify.

## 2019-05-29 NOTE — Progress Notes (Signed)
Patient transported to CT Lab at 2200.  Pt returned to the floor at about 2300.  Kim from Radiology called to notify that CT scan was not done due to IV extravasation.  She notified that they will delay to repeat the CT scan on next dialysis day.

## 2019-05-29 NOTE — Anesthesia Postprocedure Evaluation (Signed)
Anesthesia Post Note  Patient: Louis Bradley  Procedure(s) Performed: ENTEROSCOPY (N/A ) BIOPSY     Patient location during evaluation: PACU Anesthesia Type: MAC Level of consciousness: awake and alert Pain management: pain level controlled Vital Signs Assessment: post-procedure vital signs reviewed and stable Respiratory status: spontaneous breathing, nonlabored ventilation, respiratory function stable and patient connected to nasal cannula oxygen Cardiovascular status: stable and blood pressure returned to baseline Postop Assessment: no apparent nausea or vomiting Anesthetic complications: no    Last Vitals:  Vitals:   05/29/19 0949 05/29/19 0954  BP: (!) 105/47 101/75  Pulse: 82 95  Resp: (!) 21 (!) 23  Temp:    SpO2: 99% 96%    Last Pain:  Vitals:   05/29/19 0954  TempSrc:   PainSc: 0-No pain                 Effie Berkshire

## 2019-05-29 NOTE — Procedures (Signed)
Patient was seen on dialysis and the procedure was supervised.  BFR 350  Via TDC BP is  124/52.   Patient appears to be tolerating treatment well  Louis Meckel 05/29/2019

## 2019-05-29 NOTE — Op Note (Signed)
[facility] Patient Name: Louis Bradley Procedure Date : 05/29/2019 MRN: [id number] Attending MD: Wonda Horner , MD Date of Birth: 1945/11/25 CSN: [id number] Age: 73 Admit Type: Inpatient Procedure:                Small bowel enteroscopy Indications:              GI bleeding source not documented by previous video                            capsule endoscopy Providers:                Wonda Horner, MD, Carlyn Reichert, RN, Cherylynn Ridges, Technician, Judeth Cornfield, CRNA Referring MD:              Medicines:                Propofol per Anesthesia Complications:            No immediate complications. Estimated Blood Loss:     Estimated blood loss was minimal. Procedure:                Pre-Anesthesia Assessment:                           - Prior to the procedure, a History and Physical                            was performed, and patient medications and                            allergies were reviewed. The patient's tolerance of                            previous anesthesia was also reviewed. The risks                            and benefits of the procedure and the sedation                            options and risks were discussed with the patient.                            All questions were answered, and informed consent                            was obtained. Prior Anticoagulants: The patient has                            taken no previous anticoagulant or antiplatelet                            agents. ASA Grade Assessment: III - A patient with  severe systemic disease. After reviewing the risks                            and benefits, the patient was deemed in                            satisfactory condition to undergo the procedure.                           After obtaining informed consent, the endoscope was                            passed under direct vision. Throughout the   procedure, the patient's blood pressure, pulse, and                            oxygen saturations were monitored continuously. The                            [id number]-PH190L ([id number]) Olympus ultra slim colonoscope                            was introduced through the mouth and advanced to                            the proximal jejunum. The [id number]-[id number] ([id number])                            Olympus duodenoscope was introduced through the and                            advanced to the. The small bowel enteroscopy was                            accomplished without difficulty. The patient                            tolerated the procedure well. Scope In: Scope Out: Findings:      The examined esophagus was normal.      Patchy erythematous mucosa was found in the stomach.      Erythematous mucosa without active bleeding and with no stigmata of       bleeding was found in the duodenal bulb.      There was no evidence of significant pathology in the entire examined       portion of jejunum.      A single 10 mm nodule was found in the second portion of the duodenum.       Biopsies were taken with a cold forceps for histology.      A medium-sized mass with no bleeding was found in the second portion of       the duodenum. Biopsies were taken with a cold forceps for histology.       Rule out malignancy. Impression:               - Normal esophagus.                           -  Erythematous mucosa in the stomach.                           - Erythematous duodenopathy.                           - The examined portion of the jejunum was normal.                           - Duodenal nodule(s). Biopsied.                           - Duodenal mass. Biopsied. Recommendation:            Procedure Code(s):        --- Professional ---                           [phone], Small intestinal endoscopy, enteroscopy                            beyond second portion of duodenum, not including                             ileum; with biopsy, single or multiple Diagnosis Code(s):        --- Professional ---                           K31.89, Other diseases of stomach and duodenum                           K92.2, Gastrointestinal hemorrhage, unspecified CPT copyright 2019 American Medical Association. All rights reserved. The codes documented in this report are preliminary and upon coder review may  be revised to meet current compliance requirements. Wonda Horner, MD 05/29/2019 10:09:46 AM This report has been signed electronically. Number of Addenda: 0

## 2019-05-29 NOTE — Progress Notes (Signed)
Subjective:  Seen in HD-  Had enteroscopy and masses were ID's biopsies taken  Objective Vital signs in last 24 hours: Vitals:   05/29/19 0949 05/29/19 0954 05/29/19 1005 05/29/19 1104  BP: (!) 105/47 101/75 (!) 135/49 (!) 124/52  Pulse: 82 95 85 82  Resp: (!) 21 (!) 23 19 17   Temp:      TempSrc:      SpO2: 99% 96% 97% 99%  Weight:      Height:       Weight change:   Intake/Output Summary (Last 24 hours) at 05/29/2019 1230 Last data filed at 05/29/2019 2595 Gross per 24 hour  Intake 872 ml  Output -  Net 872 ml   OP HD = [facility] - just started this month 4h 1min Hep none 2/2.25 bath 123.5kg R TDC/ LUA AVF Hec 1 mcg q hd  Mircera 60 mcg q 2wks (last given 7/07) hgb 6.0<7.4  Venofer 100mg  load Last dose 7/11  Acess= R IJ Perm cath/ LUA AVF place 05/16/19   Assessment/ Plan: Pt is a 73 y.o. yo male recent start on dialysis who was admitted on 05/25/2019 with GIB and symptomatic anemia  Assessment/Plan: 1. Recurrent GI Bleed with symptomatic anemia= GI seeing / transfused 1 unit on 7/10. 2 units on 7/11 and 1 unit on 7/12, 2 units on 7/13. Had small bowel capsule test as well- fresh bleeding in distal duod vs prox jejunum- rec diagnostic enteroscopy- masses found, biopsy taken   2. ESRD - reg treatment days TTS- plan on HD today on schedule via TDC  3. Anemia- GIB as above - iron low,  repleting.  mircera given 7/7- will give aranesp as well  4. Secondary hyperparathyroidism- hectorol ordered - latest phos was 7/2- was OK - was on phoslo as OP , will add  5. HTN/volume- is fine- on low dose coreg   Louis Meckel    Labs: Basic Metabolic Panel: Recent Labs  Lab 05/25/19 1642 05/27/19 0752 05/28/19 0215  NA 138 138 137  K 3.6 3.9 4.1  CL 98 103 102  CO2 30 26 26   GLUCOSE 163* 136* 119*  BUN 46* 41* 47*  CREATININE 4.35* 3.80* 4.28*  CALCIUM 8.1* 8.1* 8.0*   Liver Function Tests: Recent Labs  Lab 05/25/19 1642  AST 20  ALT 29  ALKPHOS 51   BILITOT 0.4  PROT 5.7*  ALBUMIN 3.1*   Recent Labs  Lab 05/25/19 1642  LIPASE 34   Recent Labs  Lab 05/25/19 1644  AMMONIA 13   CBC: Recent Labs  Lab 05/25/19 1642  05/27/19 0752 05/27/19 1805 05/28/19 0215 05/28/19 1829  WBC 12.1*  --  8.3  --  9.6  --   NEUTROABS 10.0*  --   --   --  7.7  --   HGB 6.2*   < > 7.2* 7.8* 7.4* 8.3*  HCT 19.8*   < > 23.1* 24.8* 23.2* 26.1*  MCV 97.5  --  95.1  --  94.3  --   PLT 149*  --  139*  --  138*  --    < > = values in this interval not displayed.   Cardiac Enzymes: No results for input(s): CKTOTAL, CKMB, CKMBINDEX, TROPONINI in the last 168 hours. CBG: Recent Labs  Lab 05/28/19 2041 05/29/19 0135 05/29/19 0446 05/29/19 0809 05/29/19 1156  GLUCAP 152* 158* 124* 138* 126*    Iron Studies:  Recent Labs    05/28/19 0215  IRON  16*  TIBC 279   Studies/Results: No results found. Medications: Infusions: . ferric gluconate (FERRLECIT/NULECIT) IV 125 mg (05/29/19 1121)    Scheduled Medications: . sodium chloride   Intravenous Once  . sodium chloride   Intravenous Once  . calcium acetate  667 mg Oral TID WC  . carvedilol  3.125 mg Oral BID WC  . Chlorhexidine Gluconate Cloth  6 each Topical Q0600  . darbepoetin (ARANESP) injection - DIALYSIS  150 mcg Intravenous Q Tue-HD  . doxercalciferol  1 mcg Intravenous Q T,Th,Sa-HD  . pantoprazole  40 mg Intravenous Q12H    have reviewed scheduled and prn medications.  Physical Exam: General: NAD Heart: RRR Lungs: moslty clear Abdomen: soft, non tender Extremities: no edema Dialysis Access: TDC but also left upper arm AVF maturing    05/29/2019,12:30 PM  LOS: 4 days

## 2019-05-29 NOTE — Anesthesia Procedure Notes (Signed)
Procedure Name: MAC Date/Time: 05/29/2019 8:51 AM Performed by: Wilburn Cornelia, CRNA Pre-anesthesia Checklist: Patient identified, Emergency Drugs available, Suction available, Patient being monitored and Timeout performed Patient Re-evaluated:Patient Re-evaluated prior to induction Oxygen Delivery Method: Nasal cannula Placement Confirmation: positive ETCO2 and breath sounds checked- equal and bilateral Dental Injury: Teeth and Oropharynx as per pre-operative assessment

## 2019-05-29 NOTE — Progress Notes (Signed)
PT made two attempts to see pt, who was initially lethargic after EGD and then gone to HD.  Try again at another time as pt can allow.   05/29/19 1300  PT Visit Information  Last PT Received On 05/29/19  Reason Eval/Treat Not Completed Other (comment)    Mee Hives, PT MS Acute Rehab Dept. Number: [place] [facility] [place]

## 2019-05-30 ENCOUNTER — Encounter (HOSPITAL_COMMUNITY): Payer: Self-pay | Admitting: Gastroenterology

## 2019-05-30 ENCOUNTER — Telehealth: Payer: Medicare Other | Admitting: Cardiology

## 2019-05-30 LAB — GLUCOSE, CAPILLARY
Glucose-Capillary: 116 mg/dL — ABNORMAL HIGH (ref 70–99)
Glucose-Capillary: 130 mg/dL — ABNORMAL HIGH (ref 70–99)
Glucose-Capillary: 136 mg/dL — ABNORMAL HIGH (ref 70–99)
Glucose-Capillary: 146 mg/dL — ABNORMAL HIGH (ref 70–99)
Glucose-Capillary: 176 mg/dL — ABNORMAL HIGH (ref 70–99)

## 2019-05-30 LAB — COMPREHENSIVE METABOLIC PANEL
ALT: 37 U/L (ref 0–44)
AST: 19 U/L (ref 15–41)
Albumin: 2.7 g/dL — ABNORMAL LOW (ref 3.5–5.0)
Alkaline Phosphatase: 63 U/L (ref 38–126)
Anion gap: 10 (ref 5–15)
BUN: 27 mg/dL — ABNORMAL HIGH (ref 8–23)
CO2: 27 mmol/L (ref 22–32)
Calcium: 7.9 mg/dL — ABNORMAL LOW (ref 8.9–10.3)
Chloride: 100 mmol/L (ref 98–111)
Creatinine, Ser: 3.42 mg/dL — ABNORMAL HIGH (ref 0.61–1.24)
GFR calc Af Amer: 19 mL/min — ABNORMAL LOW (ref >60)
GFR calc non Af Amer: 17 mL/min — ABNORMAL LOW (ref >60)
Glucose, Bld: 133 mg/dL — ABNORMAL HIGH (ref 70–99)
Potassium: 3.9 mmol/L (ref 3.5–5.1)
Sodium: 137 mmol/L (ref 135–145)
Total Bilirubin: 1 mg/dL (ref 0.3–1.2)
Total Protein: 5.4 g/dL — ABNORMAL LOW (ref 6.5–8.1)

## 2019-05-30 LAB — PREPARE RBC (CROSSMATCH)

## 2019-05-30 LAB — CBC WITH DIFFERENTIAL/PLATELET
Abs Immature Granulocytes: 0.03 10*3/uL (ref 0.00–0.07)
Basophils Absolute: 0 10*3/uL (ref 0.0–0.1)
Basophils Relative: 0 %
Eosinophils Absolute: 0.1 10*3/uL (ref 0.0–0.5)
Eosinophils Relative: 2 %
HCT: 24.3 % — ABNORMAL LOW (ref 39.0–52.0)
Hemoglobin: 7.6 g/dL — ABNORMAL LOW (ref 13.0–17.0)
Immature Granulocytes: 0 %
Lymphocytes Relative: 9 %
Lymphs Abs: 0.6 10*3/uL — ABNORMAL LOW (ref 0.7–4.0)
MCH: 30.2 pg (ref 26.0–34.0)
MCHC: 31.3 g/dL (ref 30.0–36.0)
MCV: 96.4 fL (ref 80.0–100.0)
Monocytes Absolute: 0.6 10*3/uL (ref 0.1–1.0)
Monocytes Relative: 8 %
Neutro Abs: 5.4 10*3/uL (ref 1.7–7.7)
Neutrophils Relative %: 81 %
Platelets: 141 10*3/uL — ABNORMAL LOW (ref 150–400)
RBC: 2.52 MIL/uL — ABNORMAL LOW (ref 4.22–5.81)
RDW: 16.2 % — ABNORMAL HIGH (ref 11.5–15.5)
WBC: 6.8 10*3/uL (ref 4.0–10.5)
nRBC: 0 % (ref 0.0–0.2)

## 2019-05-30 MED ORDER — SODIUM CHLORIDE 0.9% IV SOLUTION
Freq: Once | INTRAVENOUS | Status: AC
  Administered 2019-05-30: 19:00:00 via INTRAVENOUS

## 2019-05-30 MED ORDER — CHLORHEXIDINE GLUCONATE CLOTH 2 % EX PADS: 6.0000 | MEDICATED_PAD | Freq: Every day | CUTANEOUS | Status: DC

## 2019-05-30 MED ORDER — INSULIN ASPART 100 UNIT/ML ~~LOC~~ SOLN
0.0000 [IU] | Freq: Three times a day (TID) | SUBCUTANEOUS | Status: DC
  Administered 2019-05-30: 18:00:00 1 [IU] via SUBCUTANEOUS
  Administered 2019-05-31: 17:00:00 2 [IU] via SUBCUTANEOUS
  Administered 2019-05-31 – 2019-06-01 (×2): 1 [IU] via SUBCUTANEOUS

## 2019-05-30 NOTE — Progress Notes (Signed)
PROGRESS NOTE    Louis Bradley  GHW:[id number] DOB: August 25, 1946 DOA: 05/25/2019 PCP: Colon Branch, MD     Brief Narrative:  Louis Bradley is a 73 y.o.malewithhistory of chronic kidney disease recently started on hemodialysis during last admission discharged on May 17, 2029 a week ago after being admitted for shortness of breath was found to have acute GI bleed after he was found to have gastritis and duodenitis but no active bleed to EGD that was done on 6/28.  During that hospitalization patient also underwent dialysis catheter placement and dialysis was started had gone for his routine dialysis on Thursday, 05/24/2019 and had labs done which showed a low hemoglobin of 6.2 and was advised to come to the ER. Patient denied any chest pain shortness of breath nausea vomiting abdominal pain or diarrhea. During last admission patient also had non-ST elevation MI and was at this time conservatively managed due to patient's acute GI bleed.  In the ER patient was hypotensive with blood pressure in the 78L systolic was given 1 L fluid bolus. Hemoglobin was 6.2 on-call [facility] gastroenterologist was consulted patient started on Protonix and 3 units of PRBC transfusion ordered. EKG shows possible A. fib with ST depression in the lateral leads which are comparable to the old EKG. Patient did not have any signs or symptoms of ACS.  He had elevated troponin, more than previous 1 so cardiology was consulted by admitting physician.  GI and nephrology has been on board as well.  Patient's hemoglobin has remained stable just over 7.  Due to him having intermittent atrial fibrillation with RVR, he was transfused 1 more unit of blood on 05/27/2019 which improved his hemoglobin to 7.8 only to drop again to 7.4.  Completed capsule endoscopy which shows possibility of small bowel bleed.  Underwent EGD with push enteroscopy and was found to be having duodenal mass and duodenal nodule with some erythema in the stomach and  some duodenopathy.  New events last 24 hours / Subjective: Has not had a bowel movement today.  No other new complaints.  Assessment & Plan:   Principal Problem:   Acute GI bleeding Active Problems:   Chronic renal failure in pediatric patient, stage 4 (severe) (HCC)   Diabetes mellitus type 2 with complications (HCC)   Atrial fibrillation (HCC)   Pulmonary hypertension (HCC)   ESRD (end stage renal disease) (HCC)   Symptomatic anemia and acute blood loss anemia secondary to upper GI bleed -Patient has received total of 5 units of pRBC  transfusion so far  -Transfuse 1 additional unit pRBC today, goal hemoglobin greater than 8 due to recent NSTEMI, A. fib  -Status post small bowel enteroscopy 7/14 which revealed erythematous gastropathy and duodenopathy and duodenal mass -surgical pathology pending  -CT abdomen pelvis, unfortunately all of IV contrast extravasation into right upper arm.  No visible duodenal mass shown -Protonix twice daily -IV iron  Recent non-ST elevation MI -He has no signs or symptoms of chest pain or shortness of breath.  This is likely type II MI.  No aspirin or Plavix due to acute active GI bleed.  Per cardiology, he is not a good candidate for cardiac catheterization since he will not be able to tolerate any antiplatelet if he were to require any stents.  Cardiology following   ESRD on hemodialysis -Nephrology following, dialysis TThS  Type 2 diabetes mellitus -Sliding-scale insulin  OSA on CPAP -Nightly CPAP  Paroxysmal atrial fibrillation with RVR -Continue Coreg  DVT prophylaxis: SCD Code Status: Full code Family Communication: None Disposition Plan: Pending further work-up and treatment plan   Consultants:   Nephrology  GI  Cardiology  Procedures:   Small bowel enteroscopy 7/14, Dr. Penelope Coop  Antimicrobials:  Anti-infectives (From admission, onward)   None        Objective: Vitals:   05/29/19 2028 05/30/19 0500  05/30/19 0519 05/30/19 1231  BP: 109/77  (!) 109/41 (!) 138/56  Pulse: (!) 102  65 72  Resp:    18  Temp: 98.4 F (36.9 C)  98.4 F (36.9 C) 98.4 F (36.9 C)  TempSrc: Oral  Oral Oral  SpO2: 97%  97% 97%  Weight:  126.4 kg    Height:        Intake/Output Summary (Last 24 hours) at 05/30/2019 1456 Last data filed at 05/30/2019 0539 Gross per 24 hour  Intake 120 ml  Output 2520 ml  Net -2400 ml   Filed Weights   05/29/19 1200 05/29/19 1642 05/30/19 0500  Weight: 128 kg 126.5 kg 126.4 kg    Examination:  General exam: Appears calm and comfortable  Respiratory system: Clear to auscultation. Respiratory effort normal. Cardiovascular system: S1 & S2 heard, RRR. No JVD, murmurs, rubs, gallops or clicks. No pedal edema. Gastrointestinal system: Abdomen is nondistended, soft and nontender. No organomegaly or masses felt. Normal bowel sounds heard. Central nervous system: Alert and oriented. No focal neurological deficits. Extremities: Symmetric 5 x 5 power. Skin: No rashes, lesions or ulcers Psychiatry: Judgement and insight appear normal. Mood & affect appropriate.   Data Reviewed: I have personally reviewed following labs and imaging studies  CBC: Recent Labs  Lab 05/25/19 1642  05/27/19 0752 05/27/19 1805 05/28/19 0215 05/28/19 1829 05/29/19 1829 05/30/19 0235  WBC 12.1*  --  8.3  --  9.6  --   --  6.8  NEUTROABS 10.0*  --   --   --  7.7  --   --  5.4  HGB 6.2*   < > 7.2* 7.8* 7.4* 8.3* 8.1* 7.6*  HCT 19.8*   < > 23.1* 24.8* 23.2* 26.1* 24.9* 24.3*  MCV 97.5  --  95.1  --  94.3  --   --  96.4  PLT 149*  --  139*  --  138*  --   --  141*   < > = values in this interval not displayed.   Basic Metabolic Panel: Recent Labs  Lab 05/25/19 1642 05/27/19 0752 05/28/19 0215 05/30/19 0235  NA 138 138 137 137  K 3.6 3.9 4.1 3.9  CL 98 103 102 100  CO2 30 26 26 27   GLUCOSE 163* 136* 119* 133*  BUN 46* 41* 47* 27*  CREATININE 4.35* 3.80* 4.28* 3.42*  CALCIUM 8.1* 8.1*  8.0* 7.9*   GFR: Estimated Creatinine Clearance: 27.2 mL/min (A) (by C-G formula based on SCr of 3.42 mg/dL (H)). Liver Function Tests: Recent Labs  Lab 05/25/19 1642 05/30/19 0235  AST 20 19  ALT 29 37  ALKPHOS 51 63  BILITOT 0.4 1.0  PROT 5.7* 5.4*  ALBUMIN 3.1* 2.7*   Recent Labs  Lab 05/25/19 1642  LIPASE 34   Recent Labs  Lab 05/25/19 1644  AMMONIA 13   Coagulation Profile: No results for input(s): INR, PROTIME in the last 168 hours. Cardiac Enzymes: No results for input(s): CKTOTAL, CKMB, CKMBINDEX, TROPONINI in the last 168 hours. BNP (last 3 results) No results for input(s): PROBNP in the last 8760 hours. HbA1C:  No results for input(s): HGBA1C in the last 72 hours. CBG: Recent Labs  Lab 05/29/19 2026 05/30/19 0015 05/30/19 0452 05/30/19 0801 05/30/19 1229  GLUCAP 160* 136* 116* 130* 176*   Lipid Profile: No results for input(s): CHOL, HDL, LDLCALC, TRIG, CHOLHDL, LDLDIRECT in the last 72 hours. Thyroid Function Tests: No results for input(s): TSH, T4TOTAL, FREET4, T3FREE, THYROIDAB in the last 72 hours. Anemia Panel: Recent Labs    05/28/19 0215  TIBC 279  IRON 16*   Sepsis Labs: Recent Labs  Lab 05/25/19 1644  LATICACIDVEN 1.3    Recent Results (from the past 240 hour(s))  SARS Coronavirus 2 (CEPHEID - Performed in [place] hospital lab), Hosp Order     Status: None   Collection Time: 05/25/19  7:11 PM   Specimen: Nasopharyngeal Swab  Result Value Ref Range Status   SARS Coronavirus 2 NEGATIVE NEGATIVE Final    Comment: (NOTE) If result is NEGATIVE SARS-CoV-2 target nucleic acids are NOT DETECTED. The SARS-CoV-2 RNA is generally detectable in upper and lower  respiratory specimens during the acute phase of infection. The lowest  concentration of SARS-CoV-2 viral copies this assay can detect is 250  copies / mL. A negative result does not preclude SARS-CoV-2 infection  and should not be used as the sole basis for treatment or other   patient management decisions.  A negative result may occur with  improper specimen collection / handling, submission of specimen other  than nasopharyngeal swab, presence of viral mutation(s) within the  areas targeted by this assay, and inadequate number of viral copies  (<250 copies / mL). A negative result must be combined with clinical  observations, patient history, and epidemiological information. If result is POSITIVE SARS-CoV-2 target nucleic acids are DETECTED. The SARS-CoV-2 RNA is generally detectable in upper and lower  respiratory specimens dur ing the acute phase of infection.  Positive  results are indicative of active infection with SARS-CoV-2.  Clinical  correlation with patient history and other diagnostic information is  necessary to determine patient infection status.  Positive results do  not rule out bacterial infection or co-infection with other viruses. If result is PRESUMPTIVE POSTIVE SARS-CoV-2 nucleic acids MAY BE PRESENT.   A presumptive positive result was obtained on the submitted specimen  and confirmed on repeat testing.  While 2019 novel coronavirus  (SARS-CoV-2) nucleic acids may be present in the submitted sample  additional confirmatory testing may be necessary for epidemiological  and / or clinical management purposes  to differentiate between  SARS-CoV-2 and other Sarbecovirus currently known to infect humans.  If clinically indicated additional testing with an alternate test  methodology [phone]) is advised. The SARS-CoV-2 RNA is generally  detectable in upper and lower respiratory sp ecimens during the acute  phase of infection. The expected result is Negative. Fact Sheet for Patients:  StrictlyIdeas.no Fact Sheet for Healthcare Providers: BankingDealers.co.za This test is not yet approved or cleared by the [place] FDA and has been authorized for detection and/or diagnosis of SARS-CoV-2  by FDA under an Emergency Use Authorization (EUA).  This EUA will remain in effect (meaning this test can be used) for the duration of the COVID-19 declaration under Section 564(b)(1) of the Act, 21 U.S.C. section 360bbb-3(b)(1), unless the authorization is terminated or revoked sooner. Performed at [facility], [place] [address], [place], [place] [address]       Radiology Studies: Ct Abdomen Pelvis W Contrast  Result Date: 05/29/2019 CLINICAL DATA:  GI bleed.  Mass in 2nd portion of duodenum. EXAM: CT ABDOMEN AND PELVIS WITH CONTRAST TECHNIQUE: Multidetector CT imaging of the abdomen and pelvis was performed using the standard protocol following bolus administration of intravenous contrast. CONTRAST:  100 mL OMNIPAQUE IOHEXOL 300 MG/ML SOLN. Nearly all of the 100 mL of contrast extravasated in the patient's right upper arm. The patient was assessed and was experiencing mild discomfort in the area. However, the patient's pulses, motor strength and sensation remain intact. COMPARISON:  05/14/2019 FINDINGS: Lower chest: Previously seen bilateral effusions nearly completely resolved. Trace effusions remain. Minimal dependent bibasilar atelectasis. Valvular calcifications. Heart is normal size. Hepatobiliary: No focal hepatic abnormality. Gallbladder unremarkable. Pancreas: No focal abnormality or ductal dilatation. Spleen: No focal abnormality.  Normal size. Adrenals/Urinary Tract: Multiple right side renal cysts, the largest in the right lower pole measuring up to 6.6 cm. No hydronephrosis. Severe left side hydronephrosis is stable and likely chronic with near complete cortical loss/atrophy. This is likely secondary to chronic UPJ obstruction and is unchanged. No adrenal mass. Urinary bladder unremarkable. Stomach/Bowel: Stomach, large and small bowel grossly unremarkable. No visible duodenal mass. Vascular/Lymphatic: Aortic atherosclerosis. No enlarged abdominal or pelvic lymph nodes. Reproductive:  No visible focal abnormality. Other: No free fluid or free air. Left lower quadrant spigelian hernia noted containing small bowel loops. No evidence of bowel obstruction. This is stable. Musculoskeletal: No acute bony abnormality. IMPRESSION: Contrast extravasation of nearly all 100 mL of contrast into the right upper arm. Patient was experiencing mild discomfort in the area. Pulses or sensation were not compromised, and orders for elevation and cold compresses were placed. The Radiology Physician assistant will follow-up with the patient. Severe left hydronephrosis on a chronic basis with overlying complete cortical atrophy/loss, likely chronic UPJ obstruction. Findings are stable. Right renal cysts are stable. No visible duodenal mass. Left lower quadrant spigelian hernia containing small bowel loops. No evidence of obstruction. Aortic atherosclerosis. Electronically Signed   By: Rolm Baptise M.D.   On: 05/29/2019 23:22      Scheduled Meds:  sodium chloride   Intravenous Once   calcium acetate  667 mg Oral TID WC   carvedilol  3.125 mg Oral BID WC   Chlorhexidine Gluconate Cloth  6 each Topical Q0600   darbepoetin (ARANESP) injection - DIALYSIS  150 mcg Intravenous Q Tue-HD   doxercalciferol  1 mcg Intravenous Q T,Th,Sa-HD   pantoprazole  40 mg Intravenous Q12H   Continuous Infusions:  ferric gluconate (FERRLECIT/NULECIT) IV 125 mg (05/30/19 0930)     LOS: 5 days     Time spent: 35 minutes   Dessa Phi, DO [facility] [email or web address] 05/30/2019, 2:56 PM

## 2019-05-30 NOTE — Progress Notes (Signed)
CT scan without any GI lesions noted.  Awaiting pathology from duodenal biopsies. Hgb 7.6

## 2019-05-30 NOTE — Plan of Care (Signed)
°  Problem: Clinical Measurements: °Goal: Will remain free from infection °Outcome: Progressing °Goal: Cardiovascular complication will be avoided °Outcome: Progressing °  °Problem: Coping: °Goal: Level of anxiety will decrease °Outcome: Progressing °  °Problem: Safety: °Goal: Ability to remain free from injury will improve °Outcome: Progressing °  °Problem: Skin Integrity: °Goal: Risk for impaired skin integrity will decrease °Outcome: Progressing °  °

## 2019-05-30 NOTE — Progress Notes (Signed)
Patient ID: Louis Bradley, male   DOB: 12-30-1945, 73 y.o.   MRN: [id number]   Right antecubital area 100 cc CT contrast extravasation last night Dr Ardeen Garland examined pt initially  Extrav orders in place  Upon exam today: 2x2 cm palpable hardened are beneath skin Tiny blister at skin inferior to IV site Blister intact  Area is sl tender to pt No redness No swelling FROM Sensation intact Good pulse   orders in for elevation Ice pak bandaid to blister site  We will follow few days

## 2019-05-30 NOTE — Progress Notes (Signed)
IV has been removed at CT Lab.  Ice pack applied to extravasation  site for one hour per order.

## 2019-05-30 NOTE — Progress Notes (Signed)
PT Cancellation Note  Patient Details Name: Louis Bradley MRN: [id number] DOB: 06-Sep-1946   Cancelled Treatment:    Reason Eval/Treat Not Completed: Fatigue/lethargy limiting ability to participate;Patient declined, no reason specified. Pt politely but firmly declining treatment. He reports he is too fatigued to participate and needs to keep his R UE elevated and on ice. Fatigue may be due to hgb levels at 7.6 this am. Will check back as time allows.  Louis Bradley, PTA Pager [phone] Acute Rehab  Allena Katz 05/30/2019, 2:29 PM

## 2019-05-30 NOTE — Progress Notes (Signed)
Subjective:  Had HD yest- UF 3000 tolerated fine.  Then had extravasation of IV contrast- CT not done.  Starting to get frustrated with the whole process    Objective Vital signs in last 24 hours: Vitals:   05/29/19 1804 05/29/19 2028 05/30/19 0500 05/30/19 0519  BP: (!) 101/45 109/77  (!) 109/41  Pulse: 80 (!) 102  65  Resp:      Temp:  98.4 F (36.9 C)  98.4 F (36.9 C)  TempSrc:  Oral  Oral  SpO2:  97%  97%  Weight:   126.4 kg   Height:       Weight change:   Intake/Output Summary (Last 24 hours) at 05/30/2019 1121 Last data filed at 05/30/2019 0539 Gross per 24 hour  Intake 120 ml  Output 2520 ml  Net -2400 ml   OP HD = [facility] - just started this month 4h 54min Hep none 2/2.25 bath 123.5kg R TDC/ LUA AVF Hec 1 mcg q hd  Mircera 60 mcg q 2wks (last given 7/07) hgb 6.0<7.4  Venofer 100mg  load Last dose 7/11  Acess= R IJ Perm cath/ LUA AVF place 05/16/19   Assessment/ Plan: Pt is a 73 y.o. yo male recent start on dialysis who was admitted on 05/25/2019 with GIB and symptomatic anemia  Assessment/Plan: 1. Recurrent GI Bleed with symptomatic anemia= GI seeing / transfused 1 unit on 7/10. 2 units on 7/11 ,  1 unit on 7/12, 2 units on 7/13. Had small bowel capsule test as well- fresh bleeding in distal duod vs prox jejunum- rec diagnostic enteroscopy- masses found, biopsy taken but still bleeding   2. ESRD - reg treatment days TTS- plan on HD tomorrow on schedule via TDC - amy need more blood 3. Anemia- GIB as above - iron low,  repleting.  On ESA as well.  Likely will need more blood with HD tomorrow 4. Secondary hyperparathyroidism- hectorol ordered - latest phos was 7/2- was OK - was on phoslo as OP , will add  5. HTN/volume- is fine- on low dose coreg   Louis Bradley    Labs: Basic Metabolic Panel: Recent Labs  Lab 05/27/19 0752 05/28/19 0215 05/30/19 0235  NA 138 137 137  K 3.9 4.1 3.9  CL 103 102 100  CO2 26 26 27   GLUCOSE 136* 119*  133*  BUN 41* 47* 27*  CREATININE 3.80* 4.28* 3.42*  CALCIUM 8.1* 8.0* 7.9*   Liver Function Tests: Recent Labs  Lab 05/25/19 1642 05/30/19 0235  AST 20 19  ALT 29 37  ALKPHOS 51 63  BILITOT 0.4 1.0  PROT 5.7* 5.4*  ALBUMIN 3.1* 2.7*   Recent Labs  Lab 05/25/19 1642  LIPASE 34   Recent Labs  Lab 05/25/19 1644  AMMONIA 13   CBC: Recent Labs  Lab 05/25/19 1642  05/27/19 0752  05/28/19 0215 05/28/19 1829 05/29/19 1829 05/30/19 0235  WBC 12.1*  --  8.3  --  9.6  --   --  6.8  NEUTROABS 10.0*  --   --   --  7.7  --   --  5.4  HGB 6.2*   < > 7.2*   < > 7.4* 8.3* 8.1* 7.6*  HCT 19.8*   < > 23.1*   < > 23.2* 26.1* 24.9* 24.3*  MCV 97.5  --  95.1  --  94.3  --   --  96.4  PLT 149*  --  139*  --  138*  --   --  141*   < > = values in this interval not displayed.   Cardiac Enzymes: No results for input(s): CKTOTAL, CKMB, CKMBINDEX, TROPONINI in the last 168 hours. CBG: Recent Labs  Lab 05/29/19 1654 05/29/19 2026 05/30/19 0015 05/30/19 0452 05/30/19 0801  GLUCAP 107* 160* 136* 116* 130*    Iron Studies:  Recent Labs    05/28/19 0215  IRON 16*  TIBC 279   Studies/Results: Ct Abdomen Pelvis W Contrast  Result Date: 05/29/2019 CLINICAL DATA:  GI bleed.  Mass in 2nd portion of duodenum. EXAM: CT ABDOMEN AND PELVIS WITH CONTRAST TECHNIQUE: Multidetector CT imaging of the abdomen and pelvis was performed using the standard protocol following bolus administration of intravenous contrast. CONTRAST:  100 mL OMNIPAQUE IOHEXOL 300 MG/ML SOLN. Nearly all of the 100 mL of contrast extravasated in the patient's right upper arm. The patient was assessed and was experiencing mild discomfort in the area. However, the patient's pulses, motor strength and sensation remain intact. COMPARISON:  05/14/2019 FINDINGS: Lower chest: Previously seen bilateral effusions nearly completely resolved. Trace effusions remain. Minimal dependent bibasilar atelectasis. Valvular calcifications.  Heart is normal size. Hepatobiliary: No focal hepatic abnormality. Gallbladder unremarkable. Pancreas: No focal abnormality or ductal dilatation. Spleen: No focal abnormality.  Normal size. Adrenals/Urinary Tract: Multiple right side renal cysts, the largest in the right lower pole measuring up to 6.6 cm. No hydronephrosis. Severe left side hydronephrosis is stable and likely chronic with near complete cortical loss/atrophy. This is likely secondary to chronic UPJ obstruction and is unchanged. No adrenal mass. Urinary bladder unremarkable. Stomach/Bowel: Stomach, large and small bowel grossly unremarkable. No visible duodenal mass. Vascular/Lymphatic: Aortic atherosclerosis. No enlarged abdominal or pelvic lymph nodes. Reproductive: No visible focal abnormality. Other: No free fluid or free air. Left lower quadrant spigelian hernia noted containing small bowel loops. No evidence of bowel obstruction. This is stable. Musculoskeletal: No acute bony abnormality. IMPRESSION: Contrast extravasation of nearly all 100 mL of contrast into the right upper arm. Patient was experiencing mild discomfort in the area. Pulses or sensation were not compromised, and orders for elevation and cold compresses were placed. The Radiology Physician assistant will follow-up with the patient. Severe left hydronephrosis on a chronic basis with overlying complete cortical atrophy/loss, likely chronic UPJ obstruction. Findings are stable. Right renal cysts are stable. No visible duodenal mass. Left lower quadrant spigelian hernia containing small bowel loops. No evidence of obstruction. Aortic atherosclerosis. Electronically Signed   By: Rolm Baptise M.D.   On: 05/29/2019 23:22   Medications: Infusions: . ferric gluconate (FERRLECIT/NULECIT) IV 125 mg (05/30/19 0930)    Scheduled Medications: . sodium chloride   Intravenous Once  . sodium chloride   Intravenous Once  . calcium acetate  667 mg Oral TID WC  . carvedilol  3.125 mg Oral  BID WC  . Chlorhexidine Gluconate Cloth  6 each Topical Q0600  . darbepoetin (ARANESP) injection - DIALYSIS  150 mcg Intravenous Q Tue-HD  . doxercalciferol  1 mcg Intravenous Q T,Th,Sa-HD  . pantoprazole  40 mg Intravenous Q12H    have reviewed scheduled and prn medications.  Physical Exam: General: NAD Heart: RRR Lungs: moslty clear Abdomen: soft, non tender Extremities: no edema Dialysis Access: TDC but also left upper arm AVF maturing    05/30/2019,11:21 AM  LOS: 5 days

## 2019-05-31 LAB — TYPE AND SCREEN
ABO/RH(D): O POS
Antibody Screen: NEGATIVE
Unit division: 0
Unit division: 0

## 2019-05-31 LAB — BASIC METABOLIC PANEL
Anion gap: 14 (ref 5–15)
BUN: 38 mg/dL — ABNORMAL HIGH (ref 8–23)
CO2: 22 mmol/L (ref 22–32)
Calcium: 8 mg/dL — ABNORMAL LOW (ref 8.9–10.3)
Chloride: 100 mmol/L (ref 98–111)
Creatinine, Ser: 4.64 mg/dL — ABNORMAL HIGH (ref 0.61–1.24)
GFR calc Af Amer: 13 mL/min — ABNORMAL LOW (ref >60)
GFR calc non Af Amer: 12 mL/min — ABNORMAL LOW (ref >60)
Glucose, Bld: 129 mg/dL — ABNORMAL HIGH (ref 70–99)
Potassium: 4.1 mmol/L (ref 3.5–5.1)
Sodium: 136 mmol/L (ref 135–145)

## 2019-05-31 LAB — CBC
HCT: 24.7 % — ABNORMAL LOW (ref 39.0–52.0)
Hemoglobin: 7.9 g/dL — ABNORMAL LOW (ref 13.0–17.0)
MCH: 30.3 pg (ref 26.0–34.0)
MCHC: 32 g/dL (ref 30.0–36.0)
MCV: 94.6 fL (ref 80.0–100.0)
Platelets: 139 10*3/uL — ABNORMAL LOW (ref 150–400)
RBC: 2.61 MIL/uL — ABNORMAL LOW (ref 4.22–5.81)
RDW: 15.9 % — ABNORMAL HIGH (ref 11.5–15.5)
WBC: 6.5 10*3/uL (ref 4.0–10.5)
nRBC: 0 % (ref 0.0–0.2)

## 2019-05-31 LAB — BPAM RBC
Blood Product Expiration Date: 202008122359
Blood Product Expiration Date: 202008152359
ISSUE DATE / TIME: 202007131430
ISSUE DATE / TIME: 202007151823
Unit Type and Rh: 5100
Unit Type and Rh: 5100

## 2019-05-31 LAB — GLUCOSE, CAPILLARY
Glucose-Capillary: 123 mg/dL — ABNORMAL HIGH (ref 70–99)
Glucose-Capillary: 128 mg/dL — ABNORMAL HIGH (ref 70–99)
Glucose-Capillary: 131 mg/dL — ABNORMAL HIGH (ref 70–99)
Glucose-Capillary: 194 mg/dL — ABNORMAL HIGH (ref 70–99)
Glucose-Capillary: 201 mg/dL — ABNORMAL HIGH (ref 70–99)

## 2019-05-31 MED ORDER — DOXERCALCIFEROL 4 MCG/2ML IV SOLN
INTRAVENOUS | Status: AC
  Administered 2019-05-31: 1 ug via INTRAVENOUS
  Filled 2019-05-31: qty 2

## 2019-05-31 MED ORDER — HEPARIN SODIUM (PORCINE) 1000 UNIT/ML IJ SOLN
INTRAMUSCULAR | Status: AC
  Filled 2019-05-31: qty 4

## 2019-05-31 MED ORDER — PRAVASTATIN SODIUM 40 MG PO TABS
40.0000 mg | ORAL_TABLET | Freq: Every day | ORAL | Status: DC
  Administered 2019-05-31: 21:00:00 40 mg via ORAL
  Filled 2019-05-31: qty 1

## 2019-05-31 NOTE — Progress Notes (Signed)
PROGRESS NOTE    Louis Bradley  KPT:[id number] DOB: Sep 06, 1946 DOA: 05/25/2019 PCP: Colon Branch, MD     Brief Narrative:  Louis Bradley is a 73 y.o.malewithhistory of chronic kidney disease recently started on hemodialysis during last admission discharged on May 17, 2029 a week ago after being admitted for shortness of breath was found to have acute GI bleed after he was found to have gastritis and duodenitis but no active bleed to EGD that was done on 6/28.  During that hospitalization patient also underwent dialysis catheter placement and dialysis was started had gone for his routine dialysis on Thursday, 05/24/2019 and had labs done which showed a low hemoglobin of 6.2 and was advised to come to the ER. Patient denied any chest pain shortness of breath nausea vomiting abdominal pain or diarrhea. During last admission patient also had non-ST elevation MI and was at this time conservatively managed due to patient's acute GI bleed.  In the ER patient was hypotensive with blood pressure in the 17G systolic was given 1 L fluid bolus. Hemoglobin was 6.2 on-call [facility] gastroenterologist was consulted patient started on Protonix and 3 units of PRBC transfusion ordered. EKG shows possible A. fib with ST depression in the lateral leads which are comparable to the old EKG. Patient did not have any signs or symptoms of ACS.  He had elevated troponin, more than previous 1 so cardiology was consulted by admitting physician.  GI and nephrology has been on board as well.  Patient's hemoglobin has remained stable just over 7.  Due to him having intermittent atrial fibrillation with RVR, he was transfused 1 more unit of blood on 05/27/2019 which improved his hemoglobin to 7.8 only to drop again to 7.4.  Completed capsule endoscopy which shows possibility of small bowel bleed.  Underwent EGD with push enteroscopy and was found to be having duodenal mass and duodenal nodule with some erythema in the stomach and  some duodenopathy.  New events last 24 hours / Subjective: Just completed dialysis, no physical complaints, denies any pain  Assessment & Plan:   Principal Problem:   Acute GI bleeding Active Problems:   Chronic renal failure in pediatric patient, stage 4 (severe) (HCC)   Diabetes mellitus type 2 with complications (HCC)   Atrial fibrillation (HCC)   Pulmonary hypertension (HCC)   ESRD (end stage renal disease) (HCC)   Symptomatic anemia and acute blood loss anemia secondary to upper GI bleed -Patient has received total of 6 units of pRBC  transfusion so far  -Status post small bowel enteroscopy 7/14 which revealed erythematous gastropathy and duodenopathy and duodenal mass -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  No visible duodenal mass shown -Protonix twice daily -IV iron -Monitor for further signs of bleeding  -Hgb 7.9 this morning, will hold off on transfusion today and monitor Hgb   Recent non-ST elevation MI -He has no signs or symptoms of chest pain or shortness of breath.  This is likely type II MI.  No aspirin or Plavix due to acute active GI bleed.  Per cardiology, he is not a good candidate for cardiac catheterization since he will not be able to tolerate any antiplatelet if he were to require any stents.  Cardiology signed off 7/16, will need to contact cardiology at time of patient's discharge they can arrange close follow-up  ESRD on hemodialysis -Nephrology  following, dialysis TTh[facility]  Type 2 diabetes mellitus -Sliding-scale insulin  OSA on CPAP -Nightly CPAP  Paroxysmal atrial fibrillation with RVR -Continue Coreg  HLD -Continue pravachol    DVT prophylaxis: SCD Code Status: Full code Family Communication: None Disposition Plan: Pending stabilization of Hgb and monitor bleeding.    Consultants:    Nephrology  GI  Cardiology  Procedures:   Small bowel enteroscopy 7/14, Dr. Penelope Coop  Antimicrobials:  Anti-infectives (From admission, onward)   None       Objective: Vitals:   05/31/19 0930 05/31/19 1000 05/31/19 1030 05/31/19 1101  BP: (!) 122/53 (!) 124/54 115/66 (!) 124/56  Pulse: 64 65 65 63  Resp: (!) 21 19 19 15   Temp:    98.6 F (37 C)  TempSrc:    Oral  SpO2:    96%  Weight:    124.5 kg  Height:        Intake/Output Summary (Last 24 hours) at 05/31/2019 1311 Last data filed at 05/31/2019 1101 Gross per 24 hour  Intake 740 ml  Output 2500 ml  Net -1760 ml   Filed Weights   05/31/19 0518 05/31/19 0654 05/31/19 1101  Weight: 127.2 kg 127.1 kg 124.5 kg    Examination: General exam: Appears calm and comfortable  Respiratory system: Clear to auscultation. Respiratory effort normal. Cardiovascular system: S1 & S2 heard, RRR. No JVD, murmurs, rubs, gallops or clicks. No pedal edema. Gastrointestinal system: Abdomen is nondistended, soft and nontender. No organomegaly or masses felt. Normal bowel sounds heard. Central nervous system: Alert and oriented. No focal neurological deficits. Extremities: Symmetric 5 x 5 power. Skin: No rashes, lesions or ulcers Psychiatry: Judgement and insight appear normal. Mood & affect appropriate.   Data Reviewed: I have personally reviewed following labs and imaging studies  CBC: Recent Labs  Lab 05/25/19 1642  05/27/19 0752  05/28/19 0215 05/28/19 1829 05/29/19 1829 05/30/19 0235 05/31/19 0347  WBC 12.1*  --  8.3  --  9.6  --   --  6.8 6.5  NEUTROABS 10.0*  --   --   --  7.7  --   --  5.4  --   HGB 6.2*   < > 7.2*   < > 7.4* 8.3* 8.1* 7.6* 7.9*  HCT 19.8*   < > 23.1*   < > 23.2* 26.1* 24.9* 24.3* 24.7*  MCV 97.5  --  95.1  --  94.3  --   --  96.4 94.6  PLT 149*  --  139*  --  138*  --   --  141* 139*   < > = values in this interval not displayed.   Basic Metabolic Panel: Recent Labs  Lab 05/25/19 1642  05/27/19 0752 05/28/19 0215 05/30/19 0235 05/31/19 0226  NA 138 138 137 137 136  K 3.6 3.9 4.1 3.9 4.1  CL 98 103 102 100 100  CO2 30 26 26 27 22   GLUCOSE 163* 136* 119* 133* 129*  BUN 46* 41* 47* 27* 38*  CREATININE 4.35* 3.80* 4.28* 3.42* 4.64*  CALCIUM 8.1* 8.1* 8.0* 7.9* 8.0*   GFR: Estimated Creatinine Clearance: 19.9 mL/min (A) (by C-G formula based on SCr of 4.64 mg/dL (H)). Liver Function Tests: Recent Labs  Lab 05/25/19 1642 05/30/19 0235  AST 20 19  ALT 29 37  ALKPHOS 51 63  BILITOT 0.4 1.0  PROT 5.7* 5.4*  ALBUMIN 3.1* 2.7*   Recent Labs  Lab 05/25/19 1642  LIPASE 34   Recent Labs  Lab 05/25/19 1644  AMMONIA 13   Coagulation Profile: No results for input(s): INR, PROTIME in the last 168 hours. Cardiac Enzymes: No results for input(s): CKTOTAL, CKMB, CKMBINDEX, TROPONINI in the last 168 hours. BNP (last 3 results) No results for input(s): PROBNP in the last 8760 hours. HbA1C: No results for input(s): HGBA1C in the last 72 hours. CBG: Recent Labs  Lab 05/30/19 1229 05/30/19 1710 05/31/19 0007 05/31/19 0514 05/31/19 1204  GLUCAP 176* 146* 123* 131* 128*   Lipid Profile: No results for input(s): CHOL, HDL, LDLCALC, TRIG, CHOLHDL, LDLDIRECT in the last 72 hours. Thyroid Function Tests: No results for input(s): TSH, T4TOTAL, FREET4, T3FREE, THYROIDAB in the last 72 hours. Anemia Panel: No results for input(s): VITAMINB12, FOLATE, FERRITIN, TIBC, IRON, RETICCTPCT in the last 72 hours. Sepsis Labs: Recent Labs  Lab 05/25/19 1644  LATICACIDVEN 1.3    Recent Results (from the past 240 hour(s))  SARS Coronavirus 2 (CEPHEID - Performed in [facility] hospital lab), Hosp Order     Status: None   Collection Time: 05/25/19  7:11 PM   Specimen: Nasopharyngeal Swab  Result Value Ref Range Status   SARS Coronavirus 2 NEGATIVE NEGATIVE Final    Comment: (NOTE) If result is NEGATIVE SARS-CoV-2 target nucleic acids are NOT DETECTED. The SARS-CoV-2  RNA is generally detectable in upper and lower  respiratory specimens during the acute phase of infection. The lowest  concentration of SARS-CoV-2 viral copies this assay can detect is 250  copies / mL. A negative result does not preclude SARS-CoV-2 infection  and should not be used as the sole basis for treatment or other  patient management decisions.  A negative result may occur with  improper specimen collection / handling, submission of specimen other  than nasopharyngeal swab, presence of viral mutation(s) within the  areas targeted by this assay, and inadequate number of viral copies  (<250 copies / mL). A negative result must be combined with clinical  observations, patient history, and epidemiological information. If result is POSITIVE SARS-CoV-2 target nucleic acids are DETECTED. The SARS-CoV-2 RNA is generally detectable in upper and lower  respiratory specimens dur ing the acute phase of infection.  Positive  results are indicative of active infection with SARS-CoV-2.  Clinical  correlation with patient history and other diagnostic information is  necessary to determine patient infection status.  Positive results do  not rule out bacterial infection or co-infection with other viruses. If result is PRESUMPTIVE POSTIVE SARS-CoV-2 nucleic acids MAY BE PRESENT.   A presumptive positive result was obtained on the submitted specimen  and confirmed on repeat testing.  While 2019 novel coronavirus  (SARS-CoV-2) nucleic acids may be present in the submitted sample  additional confirmatory testing may be necessary for epidemiological  and / or clinical management purposes  to differentiate between  SARS-CoV-2 and other Sarbecovirus currently known to infect humans.  If clinically indicated additional testing with an alternate test  methodology [phone]) is advised. The SARS-CoV-2 RNA is generally  detectable in upper and lower respiratory sp ecimens during the acute  phase of  infection. The expected result is Negative. Fact Sheet for Patients:  StrictlyIdeas.no Fact Sheet for Healthcare Providers: BankingDealers.co.za This test is not yet approved or cleared by the [place] FDA and has been authorized for detection and/or diagnosis of SARS-CoV-2 by FDA under an Emergency Use Authorization (EUA).  This EUA will remain in effect (meaning this test can be used) for the duration of the COVID-19 declaration under Section  564(b)(1) of the Act, 21 U.S.C. section 360bbb-3(b)(1), unless the authorization is terminated or revoked sooner. Performed at [facility], [place] [address] [place], [place] [address]       Radiology Studies: Ct Abdomen Pelvis W Contrast  Result Date: 05/29/2019 CLINICAL DATA:  GI bleed.  Mass in 2nd portion of duodenum. EXAM: CT ABDOMEN AND PELVIS WITH CONTRAST TECHNIQUE: Multidetector CT imaging of the abdomen and pelvis was performed using the standard protocol following bolus administration of intravenous contrast. CONTRAST:  100 mL OMNIPAQUE IOHEXOL 300 MG/ML SOLN. Nearly all of the 100 mL of contrast extravasated in the patient's right upper arm. The patient was assessed and was experiencing mild discomfort in the area. However, the patient's pulses, motor strength and sensation remain intact. COMPARISON:  05/14/2019 FINDINGS: Lower chest: Previously seen bilateral effusions nearly completely resolved. Trace effusions remain. Minimal dependent bibasilar atelectasis. Valvular calcifications. Heart is normal size. Hepatobiliary: No focal hepatic abnormality. Gallbladder unremarkable. Pancreas: No focal abnormality or ductal dilatation. Spleen: No focal abnormality.  Normal size. Adrenals/Urinary Tract: Multiple right side renal cysts, the largest in the right lower pole measuring up to 6.6 cm. No hydronephrosis. Severe left side hydronephrosis is stable and likely chronic with near complete  cortical loss/atrophy. This is likely secondary to chronic UPJ obstruction and is unchanged. No adrenal mass. Urinary bladder unremarkable. Stomach/Bowel: Stomach, large and small bowel grossly unremarkable. No visible duodenal mass. Vascular/Lymphatic: Aortic atherosclerosis. No enlarged abdominal or pelvic lymph nodes. Reproductive: No visible focal abnormality. Other: No free fluid or free air. Left lower quadrant spigelian hernia noted containing small bowel loops. No evidence of bowel obstruction. This is stable. Musculoskeletal: No acute bony abnormality. IMPRESSION: Contrast extravasation of nearly all 100 mL of contrast into the right upper arm. Patient was experiencing mild discomfort in the area. Pulses or sensation were not compromised, and orders for elevation and cold compresses were placed. The Radiology Physician assistant will follow-up with the patient. Severe left hydronephrosis on a chronic basis with overlying complete cortical atrophy/loss, likely chronic UPJ obstruction. Findings are stable. Right renal cysts are stable. No visible duodenal mass. Left lower quadrant spigelian hernia containing small bowel loops. No evidence of obstruction. Aortic atherosclerosis. Electronically Signed   By: Rolm Baptise M.D.   On: 05/29/2019 23:22      Scheduled Meds:  calcium acetate  667 mg Oral TID WC   carvedilol  3.125 mg Oral BID WC   Chlorhexidine Gluconate Cloth  6 each Topical Q0600   darbepoetin (ARANESP) injection - DIALYSIS  150 mcg Intravenous Q Tue-HD   doxercalciferol  1 mcg Intravenous Q T,Th,Sa-HD   heparin       insulin aspart  0-9 Units Subcutaneous TID WC   pantoprazole  40 mg Intravenous Q12H   Continuous Infusions:  ferric gluconate (FERRLECIT/NULECIT) IV Stopped (05/31/19 1115)     LOS: 6 days     Time spent: 25 minutes   Dessa Phi, DO [facility] [email or web address] 05/31/2019, 1:11 PM

## 2019-05-31 NOTE — Progress Notes (Signed)
Patient ID: Louis Bradley, male   DOB: Aug 18, 1946, 73 y.o.   MRN: [id number]   Rt antecubital space extravasation in CT 7/14 pm See prev note  Examination today shows  No redness at skin site Antecubital area still has 2x2 cm palpable place Tender slightly FROM No swelling  Tiny blister inferior to antecubital site is clean and dry Healing skin No new blisters noted NT to pt  Rec: warm compress at this point Continue elevation  Call if need [place]

## 2019-05-31 NOTE — Progress Notes (Signed)
   Patient denies any CP/SOB, seen in dialysis. Rhythm appears NSR with occasional ectopy. Discovery of duodenal mass noted. At this point recommend supportive care with close OP cardiology follow-up after discharge, per discussion with Dr. Margaretann Loveless.  [facility] will sign off.   Medication Recommendations:  Continue BB; consider resumption of statin when appropriate. Not presently on blood thinners due to recurrent GIB/ABL anemia.  Other recommendations (labs, testing, etc):  Will need close GI f/u as well as cardiology Follow up as an outpatient:  Please call us when patient is discharged so that we can arrange close f/u (timing of DC presently unclear).  Manasi Dishon PA-C

## 2019-05-31 NOTE — Progress Notes (Signed)
PT Cancellation Note  Patient Details Name: Louis Bradley MRN: [id number] DOB: [birth year]   Cancelled Treatment:      Therapy tried to see patient in the morning and he was at dialysis. In the afternoon her reported he had just walked and was fatigued. Therapy will follow up on 05/31/2019.    Carney Living 05/31/2019, 3:48 PM

## 2019-05-31 NOTE — Progress Notes (Signed)
The patient was seen today in dialysis.  I reviewed the duodenal biopsies with him.  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  I believe these are the sources of bleeding that the patient has had.  Fortunately we are not dealing with malignancy.  In reviewing bleeding related to Brunner's gland hyperplasia this is a fairly rare event.  At this time I would treat with PPI therapy and watch for any signs of further bleeding.  There have been reports of resection of these lesions either endoscopically or surgically in the event of ongoing bleeding.  Hopefully we will not have to pursue these avenues.  I discussed the situation with the patient and also called and talked to his wife and updated her.

## 2019-05-31 NOTE — TOC Initial Note (Signed)
Transition of Care [facility]) - Initial/Assessment Note    Patient Details  Name: Louis Bradley MRN: [id number] Date of Birth: May 30, 1946  [facility] CM/SW Contact:    Benard Halsted, LCSW Phone Number: 05/31/2019, 1:44 PM  Clinical Narrative:                 Patient is set up with [facility] home health services and will resume PT/OT at discharge.   Expected Discharge Plan: [place] Barriers to Discharge: No Barriers Identified   Patient Goals and CMS Choice Patient states their goals for this hospitalization and ongoing recovery are:: Return home CMS Medicare.gov Compare Post Acute Care list provided to:: (Already set up with [facility])    Expected Discharge Plan and Services Expected Discharge Plan: [place] In-house Referral: NA Discharge Planning Services: CM Consult Post Acute Care Choice: [place] arrangements for the past 2 months: Apartment                 DME Arranged: N/A         HH Arranged: PT, OT HH Agency: [facility] Date [place]: 05/30/19 Time [place]: 1343 Representative spoke with at [place]: Dorian Pod  Prior Living Arrangements/Services Living arrangements for the past 2 months: Apartment Lives with:: Spouse Patient language and need for interpreter reviewed:: Yes Do you feel safe going back to the place where you live?: Yes      Need for Family Participation in Patient Care: No (Comment) Care giver support system in place?: Yes (comment) Current home services: DME Criminal Activity/Legal Involvement Pertinent to Current Situation/Hospitalization: No - Comment as needed  Activities of Daily Living Home Assistive Devices/Equipment: CPAP, CBG Meter ADL Screening (condition at time of admission) Patient's cognitive ability adequate to safely complete daily activities?: Yes Is the patient deaf or have difficulty hearing?: No Does the patient have difficulty seeing,  even when wearing glasses/contacts?: No Does the patient have difficulty concentrating, remembering, or making decisions?: No Patient able to express need for assistance with ADLs?: Yes Does the patient have difficulty dressing or bathing?: No Independently performs ADLs?: Yes (appropriate for developmental age) Communication: Independent Dressing (OT): Independent Grooming: Independent Feeding: Independent Bathing: Independent In/Out Bed: Independent Walks in Home: Independent Does the patient have difficulty walking or climbing stairs?: Yes Weakness of Legs: Both Weakness of Arms/Hands: None  Permission Sought/Granted Permission sought to share information with : Family Supports    Share Information with NAME: Audrie Gallus [phone]           Emotional Assessment Appearance:: Appears stated age Attitude/Demeanor/Rapport: Engaged Affect (typically observed): Accepting Orientation: : Oriented to Self, Oriented to Place, Oriented to  Time, Oriented to Situation Alcohol / Substance Use: Not Applicable Psych Involvement: No (comment)  Admission diagnosis:  abnormal lab Patient Active Problem List   Diagnosis Date Noted  . Acute GI bleeding 05/25/2019  . ESRD (end stage renal disease) ([place]) 05/25/2019  . Atrial fibrillation ([place]) 05/14/2019  . Volume overload 05/14/2019  . Pulmonary hypertension ([place]) 05/14/2019  . Hypotension 05/12/2019  . Pneumonia 05/12/2019  . Acute respiratory failure ([place]) 05/12/2019  . Sepsis ([place]) 05/12/2019  . Acute lower UTI 05/12/2019  . GI bleed 05/10/2019  . Acute blood loss anemia 05/10/2019  . Amputated toe, left ([place]) 05/02/2019  . Onychomycosis 05/02/2019  . Venous insufficiency of both lower extremities 05/02/2019  . Hyperlipidemia associated with type 2 diabetes mellitus ([place]) 04/26/2019  .  Osteomyelitis of third toe of left foot ([place]) 04/17/2019  . Osteomyelitis of second toe of left foot ([place]) 04/17/2019  . Bilateral primary  osteoarthritis of knee 04/17/2019  . PCP NOTES >>>>>>>>>>>>>>> 03/01/2019  . Aortic stenosis 12/18/2018  . Iron deficiency anemia 09/09/2018  . S/P colostomy ([place]) 09/05/2016  . Vitamin D deficiency 04/26/2016  . Chronic renal failure in pediatric patient, stage 4 (severe) ([place]) 04/09/2015  . Diabetes mellitus type 2 with complications ([place]) 88/71/9597  . Essential hypertension 04/08/2015  . Hyponatremia 04/08/2015  . Hyperkalemia 05/22/2012   PCP:  Colon Branch, MD Pharmacy:   [id number] - [place], [place] [address] Phone: [phone] Fax: [place], [place] - [address] [place] [address] Phone: [phone] Fax: [phone]     Social Determinants of Health (SDOH) Interventions    Readmission Risk Interventions Readmission Risk Prevention Plan 05/17/2019  Transportation Screening Complete  PCP or Specialist Appt within 3-5 Days Complete  HRI or [place] Complete  Social Work Consult for [place] Planning/Counseling Complete  Palliative Care Screening Not Applicable  Medication Review Press photographer) Complete

## 2019-05-31 NOTE — Procedures (Signed)
Patient was seen on dialysis and the procedure was supervised.  BFR 400  Via TDC BP is  128/57.   Patient appears to be tolerating treatment well  Louis Meckel 05/31/2019

## 2019-05-31 NOTE — Progress Notes (Signed)
Subjective:  Seen on HD-  CT did not show masses- was non contrasted- no biopsy results yet- hgb up to 7.9 after one unit last night    Objective Vital signs in last 24 hours: Vitals:   05/31/19 0518 05/31/19 0654 05/31/19 0700 05/31/19 0730  BP:  (!) 121/58 (!) 118/48 (!) 128/54  Pulse:  63 63 64  Resp:  19 19 19   Temp:  98.6 F (37 C)    TempSrc:  Oral    SpO2:  100%    Weight: 127.2 kg 127.1 kg    Height:       Weight change: -0.8 kg  Intake/Output Summary (Last 24 hours) at 05/31/2019 0857 Last data filed at 05/31/2019 0500 Gross per 24 hour  Intake 1100 ml  Output -  Net 1100 ml   OP HD = [facility] - just started this month 4h 87min Hep none 2/2.25 bath 123.5kg R TDC/ LUA AVF Hec 1 mcg q hd  Mircera 60 mcg q 2wks (last given 7/07) hgb 6.0<7.4  Venofer 100mg  load Last dose 7/11  Acess= R IJ Perm cath/ LUA AVF place 05/16/19   Assessment/ Plan: Pt is a 73 y.o. yo male recent start on dialysis who was admitted on 05/25/2019 with GIB and symptomatic anemia  Assessment/Plan: 1. Recurrent GI Bleed with symptomatic anemia= GI seeing / transfused 1 unit on 7/10. 2 units on 7/11 ,  1 unit on 7/12, 2 units on 7/13, one on 7/15. Had small bowel capsule test as well- fresh bleeding in distal duod vs prox jejunum- rec diagnostic enteroscopy- masses found, biopsy taken- pending but still bleeding.  Attempt again for contrast CT   2. ESRD - reg treatment days TTS- plan on HD today on schedule via [facility] -  3. Anemia- GIB as above - iron low,  repleting.  On ESA as well.  Transfusing fairly regularly  4. Secondary hyperparathyroidism- hectorol ordered - latest phos was 7/2- was OK - was on phoslo as OP , will add  5. HTN/volume- is fine- on low dose coreg   Louis Meckel    Labs: Basic Metabolic Panel: Recent Labs  Lab 05/28/19 0215 05/30/19 0235 05/31/19 0226  NA 137 137 136  K 4.1 3.9 4.1  CL 102 100 100  CO2 26 27 22   GLUCOSE 119* 133* 129*  BUN 47*  27* 38*  CREATININE 4.28* 3.42* 4.64*  CALCIUM 8.0* 7.9* 8.0*   Liver Function Tests: Recent Labs  Lab 05/25/19 1642 05/30/19 0235  AST 20 19  ALT 29 37  ALKPHOS 51 63  BILITOT 0.4 1.0  PROT 5.7* 5.4*  ALBUMIN 3.1* 2.7*   Recent Labs  Lab 05/25/19 1642  LIPASE 34   Recent Labs  Lab 05/25/19 1644  AMMONIA 13   CBC: Recent Labs  Lab 05/25/19 1642  05/27/19 0752  05/28/19 0215  05/29/19 1829 05/30/19 0235 05/31/19 0347  WBC 12.1*  --  8.3  --  9.6  --   --  6.8 6.5  NEUTROABS 10.0*  --   --   --  7.7  --   --  5.4  --   HGB 6.2*   < > 7.2*   < > 7.4*   < > 8.1* 7.6* 7.9*  HCT 19.8*   < > 23.1*   < > 23.2*   < > 24.9* 24.3* 24.7*  MCV 97.5  --  95.1  --  94.3  --   --  96.4 94.6  PLT 149*  --  139*  --  138*  --   --  141* 139*   < > = values in this interval not displayed.   Cardiac Enzymes: No results for input(s): CKTOTAL, CKMB, CKMBINDEX, TROPONINI in the last 168 hours. CBG: Recent Labs  Lab 05/30/19 0801 05/30/19 1229 05/30/19 1710 05/31/19 0007 05/31/19 0514  GLUCAP 130* 176* 146* 123* 131*    Iron Studies:  No results for input(s): IRON, TIBC, TRANSFERRIN, FERRITIN in the last 72 hours. Studies/Results: Ct Abdomen Pelvis W Contrast  Result Date: 05/29/2019 CLINICAL DATA:  GI bleed.  Mass in 2nd portion of duodenum. EXAM: CT ABDOMEN AND PELVIS WITH CONTRAST TECHNIQUE: Multidetector CT imaging of the abdomen and pelvis was performed using the standard protocol following bolus administration of intravenous contrast. CONTRAST:  100 mL OMNIPAQUE IOHEXOL 300 MG/ML SOLN. Nearly all of the 100 mL of contrast extravasated in the patient's right upper arm. The patient was assessed and was experiencing mild discomfort in the area. However, the patient's pulses, motor strength and sensation remain intact. COMPARISON:  05/14/2019 FINDINGS: Lower chest: Previously seen bilateral effusions nearly completely resolved. Trace effusions remain. Minimal dependent  bibasilar atelectasis. Valvular calcifications. Heart is normal size. Hepatobiliary: No focal hepatic abnormality. Gallbladder unremarkable. Pancreas: No focal abnormality or ductal dilatation. Spleen: No focal abnormality.  Normal size. Adrenals/Urinary Tract: Multiple right side renal cysts, the largest in the right lower pole measuring up to 6.6 cm. No hydronephrosis. Severe left side hydronephrosis is stable and likely chronic with near complete cortical loss/atrophy. This is likely secondary to chronic UPJ obstruction and is unchanged. No adrenal mass. Urinary bladder unremarkable. Stomach/Bowel: Stomach, large and small bowel grossly unremarkable. No visible duodenal mass. Vascular/Lymphatic: Aortic atherosclerosis. No enlarged abdominal or pelvic lymph nodes. Reproductive: No visible focal abnormality. Other: No free fluid or free air. Left lower quadrant spigelian hernia noted containing small bowel loops. No evidence of bowel obstruction. This is stable. Musculoskeletal: No acute bony abnormality. IMPRESSION: Contrast extravasation of nearly all 100 mL of contrast into the right upper arm. Patient was experiencing mild discomfort in the area. Pulses or sensation were not compromised, and orders for elevation and cold compresses were placed. The Radiology Physician assistant will follow-up with the patient. Severe left hydronephrosis on a chronic basis with overlying complete cortical atrophy/loss, likely chronic UPJ obstruction. Findings are stable. Right renal cysts are stable. No visible duodenal mass. Left lower quadrant spigelian hernia containing small bowel loops. No evidence of obstruction. Aortic atherosclerosis. Electronically Signed   By: Rolm Baptise M.D.   On: 05/29/2019 23:22   Medications: Infusions: . ferric gluconate (FERRLECIT/NULECIT) IV 125 mg (05/30/19 0930)    Scheduled Medications: . calcium acetate  667 mg Oral TID WC  . carvedilol  3.125 mg Oral BID WC  . Chlorhexidine  Gluconate Cloth  6 each Topical Q0600  . darbepoetin (ARANESP) injection - DIALYSIS  150 mcg Intravenous Q Tue-HD  . doxercalciferol  1 mcg Intravenous Q T,Th,Sa-HD  . insulin aspart  0-9 Units Subcutaneous TID WC  . pantoprazole  40 mg Intravenous Q12H    have reviewed scheduled and prn medications.  Physical Exam: General: NAD Heart: RRR Lungs: moslty clear Abdomen: soft, non tender Extremities: no edema Dialysis Access: TDC but also left upper arm AVF maturing    05/31/2019,8:57 AM  LOS: 6 days

## 2019-06-01 LAB — BASIC METABOLIC PANEL
Anion gap: 10 (ref 5–15)
BUN: 28 mg/dL — ABNORMAL HIGH (ref 8–23)
CO2: 27 mmol/L (ref 22–32)
Calcium: 8 mg/dL — ABNORMAL LOW (ref 8.9–10.3)
Chloride: 99 mmol/L (ref 98–111)
Creatinine, Ser: 4.16 mg/dL — ABNORMAL HIGH (ref 0.61–1.24)
GFR calc Af Amer: 15 mL/min — ABNORMAL LOW (ref >60)
GFR calc non Af Amer: 13 mL/min — ABNORMAL LOW (ref >60)
Glucose, Bld: 158 mg/dL — ABNORMAL HIGH (ref 70–99)
Potassium: 3.5 mmol/L (ref 3.5–5.1)
Sodium: 136 mmol/L (ref 135–145)

## 2019-06-01 LAB — CBC
HCT: 24.8 % — ABNORMAL LOW (ref 39.0–52.0)
Hemoglobin: 7.7 g/dL — ABNORMAL LOW (ref 13.0–17.0)
MCH: 29.7 pg (ref 26.0–34.0)
MCHC: 31 g/dL (ref 30.0–36.0)
MCV: 95.8 fL (ref 80.0–100.0)
Platelets: 159 10*3/uL (ref 150–400)
RBC: 2.59 MIL/uL — ABNORMAL LOW (ref 4.22–5.81)
RDW: 15.8 % — ABNORMAL HIGH (ref 11.5–15.5)
WBC: 7.4 10*3/uL (ref 4.0–10.5)
nRBC: 0 % (ref 0.0–0.2)

## 2019-06-01 LAB — GLUCOSE, CAPILLARY
Glucose-Capillary: 122 mg/dL — ABNORMAL HIGH (ref 70–99)
Glucose-Capillary: 142 mg/dL — ABNORMAL HIGH (ref 70–99)
Glucose-Capillary: 143 mg/dL — ABNORMAL HIGH (ref 70–99)
Glucose-Capillary: 198 mg/dL — ABNORMAL HIGH (ref 70–99)

## 2019-06-01 MED ORDER — FERROUS SULFATE 325 (65 FE) MG PO TBEC: 325.0000 mg | DELAYED_RELEASE_TABLET | Freq: Two times a day (BID) | ORAL | 2 refills | Status: DC

## 2019-06-01 NOTE — Progress Notes (Signed)
Renal Navigator notified OP HD clinic/SW of patient's discharge today to provide continuity of care.  Alphonzo Cruise, [place] Renal Navigator [phone]

## 2019-06-01 NOTE — Discharge Summary (Addendum)
Physician Discharge Summary  Va Broadwell Kernan PFX:[id number] DOB: Jan 31, 1946 DOA: 05/25/2019  PCP: Colon Branch, MD  Admit date: 05/25/2019 Discharge date: 06/01/2019  Admitted From: Home Disposition:  Home  Recommendations for Outpatient Follow-up:  1. Follow up with PCP in 1 week 2. Follow up with GI Dr. Oletta Lamas in 2-3 weeks 3. Follow up with cardiology as scheduled on 06/21/2019  4. Continue HD TThSa 5. Please obtain CBC at HD visits, biweekly  Home Health: PT OT   Discharge Condition: Stable CODE STATUS: Full  Diet recommendation: Renal diet   Brief/Interim Summary: Louis Bradley is a 73 y.o.malewithhistory of chronic kidney disease recently started on hemodialysis during last admission discharged on May 17, 2029 a week ago after being admitted for shortness of breath was found to have acute GI bleed after he was found to have gastritis and duodenitis but no active bleed to EGD that was done on 6/28. During that hospitalization patient also underwent dialysis catheter placement and dialysis was started had gone for his routine dialysis on Thursday, 05/24/2019 and had labs done which showed a low hemoglobin of 6.2 and was advised to come to the ER. Patient denied any chest pain shortness of breath nausea vomiting abdominal pain or diarrhea. During last admission patient also had non-ST elevation MI and was at this time conservatively managed due to patient's acute GI bleed.  In the ER patient was hypotensive with blood pressure in the 32D systolic was given 1 L fluid bolus. Hemoglobin was 6.2 on-call [facility] gastroenterologist was consulted patient started on Protonix and 3 units of PRBC transfusion ordered. EKG shows possible A. fib with ST depression in the lateral leads which are comparable to the old EKG. Patient did not have any signs or symptoms of ACS. He had elevated troponin, more than previous 1 so cardiology was consulted by admitting physician. GI and nephrology has been on  board as well. Patient's hemoglobin has remained stable just over 7. Due to him having intermittent atrial fibrillation with RVR, he was transfused 1 more unit of blood on 05/27/2019 which improved his hemoglobin to 7.8 only to drop again to 7.4. Completed capsule endoscopy which shows possibility of small bowel bleed. Underwent EGD with push enteroscopy and was found to be having duodenal mass andduodenal nodule with some erythema in the stomach and some duodenopathy. 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  No visible duodenal mass shown -Protonix twice daily -IV iron given -Hgb 7.7 this morning with no further signs of active GI bleeding  Recent non-ST elevation MI -He has no signs or symptoms of chest pain or shortness of breath. This is likely type II MI. No aspirin or Plavix due to acute active GI bleed. Per cardiology, he is not a good candidate for  cardiac catheterization since he will not be able to tolerate any antiplatelet if he were to require any stents.  Cardiology signed off 7/16, will need close outpatient cardiology follow-up  ESRD on hemodialysis -Nephrology following, dialysis  TThS  Type 2 diabetes mellitus -Sliding-scale insulin  OSA on CPAP -Nightly CPAP  Paroxysmal atrial fibrillation with RVR -Continue Coreg  HLD -Continue pravachol   Discharge Instructions  Discharge Instructions    Call MD for:   Complete by: As directed    Any further signs of GI bleeding   Call MD for:  difficulty breathing, headache or visual disturbances   Complete by: As directed    Call MD for:  extreme fatigue   Complete by: As directed    Call MD for:  hives   Complete by: As directed    Call MD for:  persistant dizziness or light-headedness   Complete by: As directed    Call MD for:  persistant nausea and vomiting   Complete by: As directed    Call MD for:  severe uncontrolled pain   Complete by: As directed    Call MD for:  temperature >100.4   Complete by: As directed    Diet - low sodium heart healthy   Complete by: As directed    Discharge instructions   Complete by: As directed    You were cared for by a hospitalist during your hospital stay. If you have any questions about your discharge medications or the care you received while you were in the hospital after you are discharged, you can call the unit and ask to speak with the hospitalist on call if the hospitalist that took care of you is not available. Once you are discharged, your primary care physician will handle any further medical issues. Please note that NO REFILLS for any discharge medications will be authorized once you are discharged, as it is imperative that you return to your primary care physician (or establish a relationship with a primary care physician if you do not have one) for your aftercare needs so that they can reassess your need for medications and monitor your lab values.   Increase activity slowly   Complete by: As directed      Allergies as of 06/01/2019   No Known Allergies     Medication List    TAKE these medications   calcitRIOL 0.25 MCG capsule Commonly known as:  ROCALTROL Take 0.25 mcg by mouth every Monday, Wednesday, and Friday.   calcium acetate 667 MG capsule Commonly known as: PHOSLO Take 1 capsule (667 mg total) by mouth 3 (three) times daily with meals.   carvedilol 3.125 MG tablet Commonly known as: COREG Take 1 tablet (3.125 mg total) by mouth 2 (two) times daily with a meal.   ferrous sulfate 325 (65 FE) MG EC tablet Take 1 tablet (325 mg total) by mouth 2 (two) times daily.   glipiZIDE 5 MG tablet Commonly known as: GLUCOTROL Take 2 tablets (10 mg total) by mouth 2 (two) times daily.   nicotine polacrilex 4 MG lozenge Commonly known as: COMMIT Take 4 mg by mouth as needed for smoking cessation.   pantoprazole 40 MG tablet Commonly known as: PROTONIX Take 1 tablet (40 mg total) by mouth 2 (two) times daily.   pravastatin 40 MG tablet Commonly known as: PRAVACHOL Take 1 tablet (40 mg total) by mouth at bedtime.   PROBIOTIC DAILY PO Take 1 tablet by mouth every Monday, Wednesday, and Friday.  Follow-up Information    [facility].   Specialty: Home Health Services Why: [place] [facility] (PT/OT).  Contact information: [address] [place] [address] [phone]        Colon Branch, MD. Schedule an appointment as soon as possible for a visit in 1 week(s).   Specialty: Internal Medicine Contact information: [address] [place] [address] [phone]        Laurence Spates, MD. Schedule an appointment as soon as possible for a visit in 2 week(s).   Specialty: Gastroenterology Contact information: [address] [place] [address] [phone]          No Known Allergies  Consultations:  Nephrology  GI  Cardiology   Procedures/Studies: Ct Abdomen Pelvis W Contrast  Result Date: 05/29/2019 CLINICAL DATA:  GI bleed.  Mass in 2nd portion of duodenum. EXAM: CT ABDOMEN AND PELVIS WITH CONTRAST TECHNIQUE:  Multidetector CT imaging of the abdomen and pelvis was performed using the standard protocol following bolus administration of intravenous contrast. CONTRAST:  100 mL OMNIPAQUE IOHEXOL 300 MG/ML SOLN. Nearly all of the 100 mL of contrast extravasated in the patient's right upper arm. The patient was assessed and was experiencing mild discomfort in the area. However, the patient's pulses, motor strength and sensation remain intact. COMPARISON:  05/14/2019 FINDINGS: Lower chest: Previously seen bilateral effusions nearly completely resolved. Trace effusions remain. Minimal dependent bibasilar atelectasis. Valvular calcifications. Heart is normal size. Hepatobiliary: No focal hepatic abnormality. Gallbladder unremarkable. Pancreas: No focal abnormality or ductal dilatation. Spleen: No focal abnormality.  Normal size. Adrenals/Urinary Tract: Multiple right side renal cysts, the largest in the right lower pole measuring up to 6.6 cm. No hydronephrosis. Severe left side hydronephrosis is stable and likely chronic with near complete cortical loss/atrophy. This is likely secondary to chronic UPJ obstruction and is unchanged. No adrenal mass. Urinary bladder unremarkable. Stomach/Bowel: Stomach, large and small bowel grossly unremarkable. No visible duodenal mass. Vascular/Lymphatic: Aortic atherosclerosis. No enlarged abdominal or pelvic lymph nodes. Reproductive: No visible focal abnormality. Other: No free fluid or free air. Left lower quadrant spigelian hernia noted containing small bowel loops. No evidence of bowel obstruction. This is stable. Musculoskeletal: No acute bony abnormality. IMPRESSION: Contrast extravasation of nearly all 100 mL of contrast into the right upper arm. Patient was experiencing mild discomfort in the area. Pulses or sensation were not compromised, and orders for elevation and cold compresses were placed. The Radiology Physician assistant will follow-up with the patient. Severe left  hydronephrosis on a chronic basis with overlying complete cortical atrophy/loss, likely chronic UPJ obstruction. Findings are stable. Right renal cysts are stable. No visible duodenal mass. Left lower quadrant spigelian hernia containing small bowel loops. No evidence of obstruction. Aortic atherosclerosis. Electronically Signed   By: Rolm Baptise M.D.   On: 05/29/2019 23:22   Small bowel enteroscopy 7/14 Impression:               - Normal esophagus.                           - Erythematous mucosa in the stomach.                           - Erythematous duodenopathy.                           -  The examined portion of the jejunum was normal.                           - Duodenal nodule(s). Biopsied.                           - Duodenal mass. Biopsied.   Discharge Exam: Vitals:   05/31/19 2111 06/01/19 0602  BP: (!) 135/48 (!) 139/54  Pulse: 62 (!) 59  Resp: 20 20  Temp: 98.3 F (36.8 C) 97.9 F (36.6 C)  SpO2: 96% 100%     General: Pt is alert, awake, not in acute distress Cardiovascular: Bradycardic, rate 50s, S1/S2 +, no rubs, no gallops Respiratory: CTA bilaterally, no wheezing, no rhonchi Abdominal: Soft, NT, ND, bowel sounds + Extremities: no edema, no cyanosis    The results of significant diagnostics from this hospitalization (including imaging, microbiology, ancillary and laboratory) are listed below for reference.     Microbiology: Recent Results (from the past 240 hour(s))  SARS Coronavirus 2 (CEPHEID - Performed in [facility] hospital lab), Hosp Order     Status: None   Collection Time: 05/25/19  7:11 PM   Specimen: Nasopharyngeal Swab  Result Value Ref Range Status   SARS Coronavirus 2 NEGATIVE NEGATIVE Final    Comment: (NOTE) If result is NEGATIVE SARS-CoV-2 target nucleic acids are NOT DETECTED. The SARS-CoV-2 RNA is generally detectable in upper and lower  respiratory specimens during the acute phase of infection. The lowest  concentration of SARS-CoV-2  viral copies this assay can detect is 250  copies / mL. A negative result does not preclude SARS-CoV-2 infection  and should not be used as the sole basis for treatment or other  patient management decisions.  A negative result may occur with  improper specimen collection / handling, submission of specimen other  than nasopharyngeal swab, presence of viral mutation(s) within the  areas targeted by this assay, and inadequate number of viral copies  (<250 copies / mL). A negative result must be combined with clinical  observations, patient history, and epidemiological information. If result is POSITIVE SARS-CoV-2 target nucleic acids are DETECTED. The SARS-CoV-2 RNA is generally detectable in upper and lower  respiratory specimens dur ing the acute phase of infection.  Positive  results are indicative of active infection with SARS-CoV-2.  Clinical  correlation with patient history and other diagnostic information is  necessary to determine patient infection status.  Positive results do  not rule out bacterial infection or co-infection with other viruses. If result is PRESUMPTIVE POSTIVE SARS-CoV-2 nucleic acids MAY BE PRESENT.   A presumptive positive result was obtained on the submitted specimen  and confirmed on repeat testing.  While 2019 novel coronavirus  (SARS-CoV-2) nucleic acids may be present in the submitted sample  additional confirmatory testing may be necessary for epidemiological  and / or clinical management purposes  to differentiate between  SARS-CoV-2 and other Sarbecovirus currently known to infect humans.  If clinically indicated additional testing with an alternate test  methodology [phone]) is advised. The SARS-CoV-2 RNA is generally  detectable in upper and lower respiratory sp ecimens during the acute  phase of infection. The expected result is Negative. Fact Sheet for Patients:  StrictlyIdeas.no Fact Sheet for Healthcare  Providers: BankingDealers.co.za This test is not yet approved or cleared by the [place] FDA and has been authorized for detection and/or diagnosis of SARS-CoV-2 by FDA under  an Emergency Use Authorization (EUA).  This EUA will remain in effect (meaning this test can be used) for the duration of the COVID-19 declaration under Section 564(b)(1) of the Act, 21 U.S.C. section 360bbb-3(b)(1), unless the authorization is terminated or revoked sooner. Performed at [facility], [place] [address], [place], [place] [address]      Labs: BNP (last 3 results) Recent Labs    05/10/19 1924 05/12/19 1832  BNP 1,144.8* 6,759.1*   Basic Metabolic Panel: Recent Labs  Lab 05/27/19 0752 05/28/19 0215 05/30/19 0235 05/31/19 0226 06/01/19 0353  NA 138 137 137 136 136  K 3.9 4.1 3.9 4.1 3.5  CL 103 102 100 100 99  CO2 26 26 27 22 27   GLUCOSE 136* 119* 133* 129* 158*  BUN 41* 47* 27* 38* 28*  CREATININE 3.80* 4.28* 3.42* 4.64* 4.16*  CALCIUM 8.1* 8.0* 7.9* 8.0* 8.0*   Liver Function Tests: Recent Labs  Lab 05/25/19 1642 05/30/19 0235  AST 20 19  ALT 29 37  ALKPHOS 51 63  BILITOT 0.4 1.0  PROT 5.7* 5.4*  ALBUMIN 3.1* 2.7*   Recent Labs  Lab 05/25/19 1642  LIPASE 34   Recent Labs  Lab 05/25/19 1644  AMMONIA 13   CBC: Recent Labs  Lab 05/25/19 1642  05/27/19 0752  05/28/19 0215 05/28/19 1829 05/29/19 1829 05/30/19 0235 05/31/19 0347 06/01/19 0353  WBC 12.1*  --  8.3  --  9.6  --   --  6.8 6.5 7.4  NEUTROABS 10.0*  --   --   --  7.7  --   --  5.4  --   --   HGB 6.2*   < > 7.2*   < > 7.4* 8.3* 8.1* 7.6* 7.9* 7.7*  HCT 19.8*   < > 23.1*   < > 23.2* 26.1* 24.9* 24.3* 24.7* 24.8*  MCV 97.5  --  95.1  --  94.3  --   --  96.4 94.6 95.8  PLT 149*  --  139*  --  138*  --   --  141* 139* 159   < > = values in this interval not displayed.   Cardiac Enzymes: No results for input(s): CKTOTAL, CKMB, CKMBINDEX, TROPONINI in the last 168  hours. BNP: Invalid input(s): POCBNP CBG: Recent Labs  Lab 05/31/19 2042 06/01/19 0003 06/01/19 0421 06/01/19 0749 06/01/19 1155  GLUCAP 201* 198* 142* 122* 143*   D-Dimer No results for input(s): DDIMER in the last 72 hours. Hgb A1c No results for input(s): HGBA1C in the last 72 hours. Lipid Profile No results for input(s): CHOL, HDL, LDLCALC, TRIG, CHOLHDL, LDLDIRECT in the last 72 hours. Thyroid function studies No results for input(s): TSH, T4TOTAL, T3FREE, THYROIDAB in the last 72 hours.  Invalid input(s): FREET3 Anemia work up No results for input(s): VITAMINB12, FOLATE, FERRITIN, TIBC, IRON, RETICCTPCT in the last 72 hours. Urinalysis    Component Value Date/Time   COLORURINE YELLOW 05/12/2019 1600   APPEARANCEUR HAZY (A) 05/12/2019 1600   LABSPEC 1.013 05/12/2019 1600   PHURINE 5.0 05/12/2019 1600   GLUCOSEU 50 (A) 05/12/2019 1600   HGBUR SMALL (A) 05/12/2019 1600   BILIRUBINUR NEGATIVE 05/12/2019 1600   KETONESUR NEGATIVE 05/12/2019 1600   PROTEINUR 100 (A) 05/12/2019 1600   NITRITE NEGATIVE 05/12/2019 1600   LEUKOCYTESUR LARGE (A) 05/12/2019 1600   Sepsis Labs Invalid input(s): PROCALCITONIN,  WBC,  LACTICIDVEN Microbiology Recent Results (from the past 240 hour(s))  SARS Coronavirus 2 (CEPHEID - Performed in  [facility] hospital lab), Hosp Order     Status: None   Collection Time: 05/25/19  7:11 PM   Specimen: Nasopharyngeal Swab  Result Value Ref Range Status   SARS Coronavirus 2 NEGATIVE NEGATIVE Final    Comment: (NOTE) If result is NEGATIVE SARS-CoV-2 target nucleic acids are NOT DETECTED. The SARS-CoV-2 RNA is generally detectable in upper and lower  respiratory specimens during the acute phase of infection. The lowest  concentration of SARS-CoV-2 viral copies this assay can detect is 250  copies / mL. A negative result does not preclude SARS-CoV-2 infection  and should not be used as the sole basis for treatment or other  patient management  decisions.  A negative result may occur with  improper specimen collection / handling, submission of specimen other  than nasopharyngeal swab, presence of viral mutation(s) within the  areas targeted by this assay, and inadequate number of viral copies  (<250 copies / mL). A negative result must be combined with clinical  observations, patient history, and epidemiological information. If result is POSITIVE SARS-CoV-2 target nucleic acids are DETECTED. The SARS-CoV-2 RNA is generally detectable in upper and lower  respiratory specimens dur ing the acute phase of infection.  Positive  results are indicative of active infection with SARS-CoV-2.  Clinical  correlation with patient history and other diagnostic information is  necessary to determine patient infection status.  Positive results do  not rule out bacterial infection or co-infection with other viruses. If result is PRESUMPTIVE POSTIVE SARS-CoV-2 nucleic acids MAY BE PRESENT.   A presumptive positive result was obtained on the submitted specimen  and confirmed on repeat testing.  While 2019 novel coronavirus  (SARS-CoV-2) nucleic acids may be present in the submitted sample  additional confirmatory testing may be necessary for epidemiological  and / or clinical management purposes  to differentiate between  SARS-CoV-2 and other Sarbecovirus currently known to infect humans.  If clinically indicated additional testing with an alternate test  methodology [phone]) is advised. The SARS-CoV-2 RNA is generally  detectable in upper and lower respiratory sp ecimens during the acute  phase of infection. The expected result is Negative. Fact Sheet for Patients:  StrictlyIdeas.no Fact Sheet for Healthcare Providers: BankingDealers.co.za This test is not yet approved or cleared by the [place] FDA and has been authorized for detection and/or diagnosis of SARS-CoV-2 by FDA under an  Emergency Use Authorization (EUA).  This EUA will remain in effect (meaning this test can be used) for the duration of the COVID-19 declaration under Section 564(b)(1) of the Act, 21 U.S.C. section 360bbb-3(b)(1), unless the authorization is terminated or revoked sooner. Performed at [facility], [place] [address] [place], [place] [address]        Patient was seen and examined on the day of discharge and was found to be in stable condition. Time coordinating discharge: 25 minutes including assessment and coordination of care, as well as examination of the patient.   SIGNED:  Dessa Phi, DO [facility] [email or web address] 06/01/2019, 12:03 PM

## 2019-06-01 NOTE — Progress Notes (Signed)
Resting comfortably. Hgb 7.7. Nothing further to add today. See my note yesterday.

## 2019-06-01 NOTE — Progress Notes (Signed)
Subjective:  HD yest- removed 2500 tolerated well.  hgb low but stable-  Duodenal mass was benign- plan is for PPI and monitor for cont bleeding but due to be discharged today   Objective Vital signs in last 24 hours: Vitals:   05/31/19 1101 05/31/19 1356 05/31/19 2111 06/01/19 0602  BP: (!) 124/56 (!) 134/53 (!) 135/48 (!) 139/54  Pulse: 63 64 62 (!) 59  Resp: 15 18 20 20   Temp: 98.6 F (37 C) 98.3 F (36.8 C) 98.3 F (36.8 C) 97.9 F (36.6 C)  TempSrc: Oral Oral Oral Oral  SpO2: 96% 96% 96% 100%  Weight: 124.5 kg   126.5 kg  Height:       Weight change: -2.7 kg  Intake/Output Summary (Last 24 hours) at 06/01/2019 1054 Last data filed at 05/31/2019 1101 Gross per 24 hour  Intake -  Output 2500 ml  Net -2500 ml   OP HD = [facility] - just started this month 4h 57min Hep none 2/2.25 bath 123.5kg R TDC/ LUA AVF Hec 1 mcg q hd  Mircera 60 mcg q 2wks (last given 7/07) hgb 6.0<7.4  Venofer 100mg  load Last dose 7/11  Acess= R IJ Perm cath/ LUA AVF place 05/16/19   Assessment/ Plan: Pt is a 73 y.o. yo male recent start on dialysis who was admitted on 05/25/2019 with GIB and symptomatic anemia  Assessment/Plan: 1. Recurrent GI Bleed with symptomatic anemia= GI seeing / transfused 1 unit on 7/10. 2 units on 7/11 ,  1 unit on 7/12, 2 units on 7/13, one on 7/15. Had small bowel capsule test as well- fresh bleeding in distal duod vs prox jejunum- rec diagnostic enteroscopy- masses found, biopsy taken= benign-   2. ESRD - reg treatment days TTS- plan on HD tomorrow on schedule via [facility] 3. Anemia- GIB as above - iron low,  repleting.  On ESA as well.  Transfusing fairly regularly.  To go home, we will watch hgb weekly at the center- max meds and hope he will not need further transfusions  4. Secondary hyperparathyroidism- hectorol ordered - latest phos was 7/2- was OK - was on phoslo as OP ,  added  5. HTN/volume- is fine- on low dose coreg   Louis Meckel    Labs: Basic Metabolic Panel: Recent Labs  Lab 05/30/19 0235 05/31/19 0226 06/01/19 0353  NA 137 136 136  K 3.9 4.1 3.5  CL 100 100 99  CO2 27 22 27   GLUCOSE 133* 129* 158*  BUN 27* 38* 28*  CREATININE 3.42* 4.64* 4.16*  CALCIUM 7.9* 8.0* 8.0*   Liver Function Tests: Recent Labs  Lab 05/25/19 1642 05/30/19 0235  AST 20 19  ALT 29 37  ALKPHOS 51 63  BILITOT 0.4 1.0  PROT 5.7* 5.4*  ALBUMIN 3.1* 2.7*   Recent Labs  Lab 05/25/19 1642  LIPASE 34   Recent Labs  Lab 05/25/19 1644  AMMONIA 13   CBC: Recent Labs  Lab 05/25/19 1642  05/27/19 0752  05/28/19 0215  05/30/19 0235 05/31/19 0347 06/01/19 0353  WBC 12.1*  --  8.3  --  9.6  --  6.8 6.5 7.4  NEUTROABS 10.0*  --   --   --  7.7  --  5.4  --   --   HGB 6.2*   < > 7.2*   < > 7.4*   < > 7.6* 7.9* 7.7*  HCT 19.8*   < > 23.1*   < >  23.2*   < > 24.3* 24.7* 24.8*  MCV 97.5  --  95.1  --  94.3  --  96.4 94.6 95.8  PLT 149*  --  139*  --  138*  --  141* 139* 159   < > = values in this interval not displayed.   Cardiac Enzymes: No results for input(s): CKTOTAL, CKMB, CKMBINDEX, TROPONINI in the last 168 hours. CBG: Recent Labs  Lab 05/31/19 1652 05/31/19 2042 06/01/19 0003 06/01/19 0421 06/01/19 0749  GLUCAP 194* 201* 198* 142* 122*    Iron Studies:  No results for input(s): IRON, TIBC, TRANSFERRIN, FERRITIN in the last 72 hours. Studies/Results: No results found. Medications: Infusions: . ferric gluconate (FERRLECIT/NULECIT) IV 125 mg (06/01/19 0959)    Scheduled Medications: . calcium acetate  667 mg Oral TID WC  . carvedilol  3.125 mg Oral BID WC  . Chlorhexidine Gluconate Cloth  6 each Topical Q0600  . darbepoetin (ARANESP) injection - DIALYSIS  150 mcg Intravenous Q Tue-HD  . doxercalciferol  1 mcg Intravenous Q T,Th,Sa-HD  . insulin aspart  0-9 Units Subcutaneous TID WC  . pantoprazole  40 mg Intravenous Q12H  . pravastatin  40 mg Oral QHS    have reviewed scheduled and  prn medications.  Physical Exam: General: NAD Heart: RRR Lungs: moslty clear Abdomen: soft, non tender Extremities: no edema Dialysis Access: TDC but also left upper arm AVF maturing    06/01/2019,10:54 AM  LOS: 7 days

## 2019-06-01 NOTE — Progress Notes (Signed)
Pt placed self on CPAP dream station for the night on his home setting of 12 with no oxygen bled into the system. Pt respiratory status is stable at this time. RT will continue to monitor.

## 2019-06-01 NOTE — Progress Notes (Signed)
Mariea Stable Beranek to be D/C'd home health per MD order. Discussed with the patient and all questions fully answered. Went over discharge instructions with daughter as well. VVS, Skin clean, dry and intact without evidence of skin break down, no evidence of skin tears noted.  IV catheter discontinued intact. Site without signs and symptoms of complications. Dressing and pressure applied.  An After Visit Summary was printed and given to the patient.  Patient escorted via [place], and D/C home via private auto.  Melonie Florida  06/01/2019 12:52 PM

## 2019-06-04 ENCOUNTER — Telehealth: Payer: Self-pay | Admitting: *Deleted

## 2019-06-04 NOTE — Telephone Encounter (Signed)
Pt wife answered phone. States that pt is doing well and  is aware that he needs to schedule appt. Reports pt will call back at his convenience to schedule and/or do TCM call.

## 2019-06-04 NOTE — Telephone Encounter (Signed)
thx

## 2019-06-08 ENCOUNTER — Other Ambulatory Visit: Payer: Self-pay

## 2019-06-08 ENCOUNTER — Ambulatory Visit (INDEPENDENT_AMBULATORY_CARE_PROVIDER_SITE_OTHER): Payer: Medicare Other | Admitting: Internal Medicine

## 2019-06-08 DIAGNOSIS — E118 Type 2 diabetes mellitus with unspecified complications: Secondary | ICD-10-CM | POA: Diagnosis not present

## 2019-06-08 DIAGNOSIS — Z992 Dependence on renal dialysis: Secondary | ICD-10-CM

## 2019-06-08 DIAGNOSIS — N186 End stage renal disease: Secondary | ICD-10-CM | POA: Diagnosis not present

## 2019-06-08 DIAGNOSIS — D62 Acute posthemorrhagic anemia: Secondary | ICD-10-CM

## 2019-06-08 DIAGNOSIS — K922 Gastrointestinal hemorrhage, unspecified: Secondary | ICD-10-CM | POA: Diagnosis not present

## 2019-06-08 MED ORDER — DICLOFENAC SODIUM 1 % TD CREA: 1.0000 "application " | TOPICAL_CREAM | Freq: Two times a day (BID) | TRANSDERMAL | 3 refills | Status: AC | PRN

## 2019-06-08 NOTE — Progress Notes (Addendum)
Subjective:    Patient ID: Louis Bradley, male    DOB: 1946-04-09, 73 y.o.   MRN: [id number]  DOS:  06/08/2019 Type of visit - description: Attempted  to make this a video visit, due to technical difficulties from the patient side it was not possible  thus we proceeded with a Virtual Visit via Telephone    I connected with@   by telephone and verified that I am speaking with the correct person using two identifiers.  THIS ENCOUNTER IS A VIRTUAL VISIT DUE TO COVID-19 - PATIENT WAS NOT SEEN IN THE OFFICE. PATIENT HAS CONSENTED TO VIRTUAL VISIT / TELEMEDICINE VISIT   Location of patient: home  Location of provider: office  I discussed the limitations, risks, security and privacy concerns of performing an evaluation and management service by telephone and the availability of in person appointments. I also discussed with the patient that there may be a patient responsible charge related to this service. The patient expressed understanding and agreed to proceed.   History of Present Illness:    Since the last office visit, he went to the ER twice, was admitted twice.  Upon the initial admission on 05/13/2019, the diagnosis was acute respiratory failure, non-STEMI, anemia due to GI bleed, iron deficiency, suspected pneumonia.  EGD was done on 05/13/2019, + gastritis and duodenitis. Also dialysis catheter was placed.  He was readmitted 05/25/2019 and discharged a week later. He was admitted due to hypotension, Hg  was 6.2, had 7 PRBCs transfusion, small bowel enteroscopy 05/29/2019 showing erythematous gastropathy and no adenopathy, + duodenal mass, biopsy showing Brunner's gland hyperplasia. CT abdomen no visible duodenal mass.  He is on hemodialysis TThS    Most recent labs: 06/01/2019: Potassium 3.5, creatinine 4.1.  Hemoglobin 7.7.   Review of Systems With the patient, since he left the hospital he is doing hemodialysis. He feels very well. Hemoglobin is increasing CBGs are 90-110.  Denies fever chills No chest pain, difficulty breathing or lower extremity edema. Appetite is good No nausea, vomiting, diarrhea.  No blood in the stools. Has severe arthritis and knee pain, wonders about topical diclofenac.   Past Medical History:  Diagnosis Date  . Anemia   . Anesthesia complication    General anesthesia makes me feel foggy for long periods of time  . Arthritis   . CKD (chronic kidney disease) stage V requiring chronic dialysis ([place])    Stage 4-5  . Complication of anesthesia    slow to come out of anesthesia - a lot of confusion   . Diabetes ([place])   . Fournier gangrene   . GI bleed   . Heart murmur    Born with, no problems  . History of blood transfusion    x2  . Hyperlipidemia   . Hypertension   . Moderate aortic stenosis   . NSTEMI (non-ST elevated myocardial infarction) ([place])   . Obesity   . OSA on CPAP   . Osteomyelitis ([place]) 2016   L toe  . Pulmonary hypertension ([place])   . Renal atrophy, left   . Vitamin D deficiency     Past Surgical History:  Procedure Laterality Date  . AMPUTATION TOE Left 04/20/2019   Procedure: AMPUTATION LEFT SECOND AND THIRD TOES;  Surgeon: Newt Minion, MD;  Location: [facility];  Service: Orthopedics;  Laterality: Left;  . AV FISTULA PLACEMENT Left 05/16/2019   Procedure: ARTERIOVENOUS (AV) FISTULA CREATION;  Surgeon: Serafina Mitchell, MD;  Location: [facility];  Service:  Vascular;  Laterality: Left;  . BIOPSY  05/13/2019   Procedure: BIOPSY;  Surgeon: Laurence Spates, MD;  Location: [facility];  Service: Endoscopy;;  . BIOPSY  05/29/2019   Procedure: BIOPSY;  Surgeon: Wonda Horner, MD;  Location[facility] ENDOSCOPY;  Service: Endoscopy;;  . COLONOSCOPY    . COLOSTOMY  03/2015  . COLOSTOMY REVERSAL    . ENTEROSCOPY N/A 05/29/2019   Procedure: ENTEROSCOPY;  Surgeon: Wonda Horner, MD;  Location: [facility] ENDOSCOPY;  Service: Endoscopy;  Laterality: N/A;  . ESOPHAGOGASTRODUODENOSCOPY N/A 05/13/2019   Procedure: ESOPHAGOGASTRODUODENOSCOPY  (EGD);  Surgeon: Laurence Spates, MD;  Location: [facility] ENDOSCOPY;  Service: Endoscopy;  Laterality: N/A;  . GIVENS CAPSULE STUDY N/A 05/27/2019   Procedure: GIVENS CAPSULE STUDY;  Surgeon: Wonda Horner, MD;  Location: [facility] ENDOSCOPY;  Service: Endoscopy;  Laterality: N/A;  . INSERTION OF DIALYSIS CATHETER Right 05/16/2019   Procedure: INSERTION OF DIALYSIS CATHETER;  Surgeon: Serafina Mitchell, MD;  Location: [facility] OR;  Service: Vascular;  Laterality: Right;  . IR THORACENTESIS ASP PLEURAL SPACE W/IMG GUIDE  05/15/2019  . TOE AMPUTATION Left 2016   Left big toe amputated due to MRSA infection    Social History   Socioeconomic History  . Marital status: Married    Spouse name: Not on file  . Number of children: 2  . Years of education: Not on file  . Highest education level: Not on file  Occupational History  . Occupation: retired-owned a Environmental manager   . Occupation: retired- Theatre manager  Social Needs  . Financial resource strain: Not on file  . Food insecurity    Worry: Not on file    Inability: Not on file  . Transportation needs    Medical: Not on file    Non-medical: Not on file  Tobacco Use  . Smoking status: Former Research scientist (life sciences)  . Smokeless tobacco: Never Used  . Tobacco comment: quit 12/2018 (1 ppd)  Substance and Sexual Activity  . Alcohol use: Yes    Comment: social  . Drug use: Never  . Sexual activity: Not on file  Lifestyle  . Physical activity    Days per week: Not on file    Minutes per session: Not on file  . Stress: Not on file  Relationships  . Social Herbalist on phone: Not on file    Gets together: Not on file    Attends religious service: Not on file    Active member of club or organization: Not on file    Attends meetings of clubs or organizations: Not on file    Relationship status: Not on file  . Intimate partner violence    Fear of current or ex partner: Not on file    Emotionally abused: Not on file    Physically abused: Not on file    Forced  sexual activity: Not on file  Other Topics Concern  . Not on file  Social History Narrative   Patient and his wife moved from [place] on 02/14/2021 live with his daughter      Allergies as of 06/08/2019   No Known Allergies     Medication List       Accurate as of June 08, 2019 11:59 PM. If you have any questions, ask your nurse or doctor.        STOP taking these medications   ferrous sulfate 325 (65 FE) MG EC tablet Stopped by: Kathlene November, MD     TAKE these medications  calcitRIOL 0.25 MCG capsule Commonly known as: ROCALTROL Take 0.25 mcg by mouth every Monday, Wednesday, and Friday.   calcium acetate 667 MG capsule Commonly known as: PHOSLO Take 1 capsule (667 mg total) by mouth 3 (three) times daily with meals.   carvedilol 3.125 MG tablet Commonly known as: COREG Take 1 tablet (3.125 mg total) by mouth 2 (two) times daily with a meal.   Diclofenac Sodium 1 % Crea Place 1 application onto the skin 2 (two) times daily as needed. Started by: Kathlene November, MD   glipiZIDE 5 MG tablet Commonly known as: GLUCOTROL Take 2 tablets (10 mg total) by mouth 2 (two) times daily.   nicotine polacrilex 4 MG lozenge Commonly known as: COMMIT Take 4 mg by mouth as needed for smoking cessation.   pantoprazole 40 MG tablet Commonly known as: PROTONIX Take 1 tablet (40 mg total) by mouth 2 (two) times daily.   pravastatin 40 MG tablet Commonly known as: PRAVACHOL Take 1 tablet (40 mg total) by mouth at bedtime.   PROBIOTIC DAILY PO Take 1 tablet by mouth every Monday, Wednesday, and Friday.           Objective:   Physical Exam There were no vitals taken for this visit. This is a virtual phone visit, alert oriented x3, he sounded well.    Assessment    Assessment (new patient/2020, referred by his daughter) DM HTN Hyperlipidemia CKD: HD fistula planned ~ 11/2018, cancelled Vitamin D deficiency Aortic valve stenosis: Next echo 2020 OSA, on CPAP H/o osteomyelitis  status post left toe amputation H/o gangrene, buttocks, had temporarily a colostomy Former smoker, quit 12-2018 Anemia:  -Seen at the ER with fatigue, hemoglobin of 8.6 08-2018: 2 PRBCs, had a EGD and colonoscopy. -Admitted7/08/2019, hemoglobin 6.2, 7 PRBCs.  Capsule endoscopy, + duodenal mass, status post small bowel endoscopy, see D/C  summary.   PLAN: Non-STEMI: Treated conservatively, currently asymptomatic GI bleed, anemia: Status post EGD and  a enteroscopy, was found  gastropathy, and a duodenal mass felt to be Brunner's gland hyperplasia. Currently asymptomatic, on PPIs twice a day, has a follow-up set up with GI already. Hemoglobin when he left the hospital 7.7, he was told yesterday at the hemodialysis center hemoglobin was 9.4.  On iron parenteral when he does hemodialysis. Reviewed and summarized the work-up, see  above.  CKD: On hemodialysis Tuesday Thursdays and Saturdays. DM: On glimepiride, CBGs 90, 110, warned about low sugars.  If symptoms or sugars drop below 80 we will have to decrease or stop glimepiride and let me know.  He is aware. DJD: Complains of knee pain, likely DJD, wonders about diclofenac 1%, that is okay, will send a prescription. Preventive care: Recommend a flu shot, patient is strongly declined it, benefits discussed. RTC 4 months      I discussed the assessment and treatment plan with the patient. The patient was provided an opportunity to ask questions and all were answered. The patient agreed with the plan and demonstrated an understanding of the instructions.   The patient was advised to call back or seek an in-person evaluation if the symptoms worsen or if the condition fails to improve as anticipated.  I provided 35 minutes of non-face-to-face time during this encounter.  Kathlene November, MD

## 2019-06-10 NOTE — Assessment & Plan Note (Signed)
Non-STEMI: Treated conservatively, currently asymptomatic GI bleed, anemia: Status post EGD and  a enteroscopy, was found  gastropathy, and a duodenal mass felt to be Brunner's gland hyperplasia. Currently asymptomatic, on PPIs twice a day, has a follow-up set up with GI already. Hemoglobin when he left the hospital 7.7, he was told yesterday at the hemodialysis center hemoglobin was 9.4.  On iron parenteral when he does hemodialysis. Reviewed and summarized the work-up, see  above.  CKD: On hemodialysis Tuesday Thursdays and Saturdays. DM: On glimepiride, CBGs 90, 110, warned about low sugars.  If symptoms or sugars drop below 80 we will have to decrease or stop glimepiride and let me know.  He is aware. DJD: Complains of knee pain, likely DJD, wonders about diclofenac 1%, that is okay, will send a prescription. Preventive care: Recommend a flu shot, patient is strongly declined it, benefits discussed. RTC 4 months

## 2019-06-13 ENCOUNTER — Telehealth: Payer: Self-pay | Admitting: *Deleted

## 2019-06-13 LAB — FUNGAL ORGANISM REFLEX

## 2019-06-13 LAB — FUNGUS CULTURE WITH STAIN

## 2019-06-13 LAB — FUNGUS CULTURE RESULT

## 2019-06-13 NOTE — Telephone Encounter (Signed)
Patient's wife said that patient wanted to get an appointment with Dr. Burt Knack on [address] instead so we canceled the appointment with Dr. Margaretann Loveless. Told patient that I would reach out to Dr. Margaretann Loveless and Dr. Burt Knack about that switch and that someone would call about appointment with Dr. Burt Knack.

## 2019-06-14 ENCOUNTER — Telehealth: Payer: Self-pay

## 2019-06-14 DIAGNOSIS — Z7984 Long term (current) use of oral hypoglycemic drugs: Secondary | ICD-10-CM

## 2019-06-14 DIAGNOSIS — N186 End stage renal disease: Secondary | ICD-10-CM

## 2019-06-14 DIAGNOSIS — E785 Hyperlipidemia, unspecified: Secondary | ICD-10-CM

## 2019-06-14 DIAGNOSIS — K3189 Other diseases of stomach and duodenum: Secondary | ICD-10-CM

## 2019-06-14 DIAGNOSIS — Z992 Dependence on renal dialysis: Secondary | ICD-10-CM

## 2019-06-14 DIAGNOSIS — I509 Heart failure, unspecified: Secondary | ICD-10-CM | POA: Diagnosis not present

## 2019-06-14 DIAGNOSIS — I132 Hypertensive heart and chronic kidney disease with heart failure and with stage 5 chronic kidney disease, or end stage renal disease: Secondary | ICD-10-CM

## 2019-06-14 DIAGNOSIS — Z9181 History of falling: Secondary | ICD-10-CM

## 2019-06-14 DIAGNOSIS — E559 Vitamin D deficiency, unspecified: Secondary | ICD-10-CM

## 2019-06-14 DIAGNOSIS — I252 Old myocardial infarction: Secondary | ICD-10-CM

## 2019-06-14 DIAGNOSIS — I480 Paroxysmal atrial fibrillation: Secondary | ICD-10-CM

## 2019-06-14 DIAGNOSIS — E669 Obesity, unspecified: Secondary | ICD-10-CM

## 2019-06-14 DIAGNOSIS — E1169 Type 2 diabetes mellitus with other specified complication: Secondary | ICD-10-CM

## 2019-06-14 DIAGNOSIS — I872 Venous insufficiency (chronic) (peripheral): Secondary | ICD-10-CM

## 2019-06-14 DIAGNOSIS — M170 Bilateral primary osteoarthritis of knee: Secondary | ICD-10-CM

## 2019-06-14 DIAGNOSIS — I272 Pulmonary hypertension, unspecified: Secondary | ICD-10-CM

## 2019-06-14 DIAGNOSIS — Z89422 Acquired absence of other left toe(s): Secondary | ICD-10-CM

## 2019-06-14 DIAGNOSIS — Z87891 Personal history of nicotine dependence: Secondary | ICD-10-CM

## 2019-06-14 DIAGNOSIS — G4733 Obstructive sleep apnea (adult) (pediatric): Secondary | ICD-10-CM

## 2019-06-14 DIAGNOSIS — D631 Anemia in chronic kidney disease: Secondary | ICD-10-CM

## 2019-06-14 DIAGNOSIS — D509 Iron deficiency anemia, unspecified: Secondary | ICD-10-CM

## 2019-06-14 DIAGNOSIS — Z6836 Body mass index (BMI) 36.0-36.9, adult: Secondary | ICD-10-CM

## 2019-06-14 DIAGNOSIS — Z8701 Personal history of pneumonia (recurrent): Secondary | ICD-10-CM

## 2019-06-14 DIAGNOSIS — E1122 Type 2 diabetes mellitus with diabetic chronic kidney disease: Secondary | ICD-10-CM

## 2019-06-14 NOTE — Telephone Encounter (Signed)
Plan of care received from [place]- signed and faxed back to [phone]. Form sent for scanning.

## 2019-06-20 ENCOUNTER — Encounter (HOSPITAL_COMMUNITY): Payer: Self-pay

## 2019-06-20 ENCOUNTER — Other Ambulatory Visit: Payer: Self-pay

## 2019-06-20 ENCOUNTER — Emergency Department (HOSPITAL_COMMUNITY): Payer: Medicare Other

## 2019-06-20 ENCOUNTER — Emergency Department (HOSPITAL_COMMUNITY)
Admission: EM | Admit: 2019-06-20 | Discharge: 2019-06-20 | Disposition: A | Discharge status: Home / Self Care | Payer: Medicare Other | Attending: Emergency Medicine | Admitting: Emergency Medicine

## 2019-06-20 DIAGNOSIS — R509 Fever, unspecified: Secondary | ICD-10-CM | POA: Insufficient documentation

## 2019-06-20 DIAGNOSIS — R112 Nausea with vomiting, unspecified: Secondary | ICD-10-CM

## 2019-06-20 DIAGNOSIS — Z20828 Contact with and (suspected) exposure to other viral communicable diseases: Secondary | ICD-10-CM | POA: Diagnosis not present

## 2019-06-20 DIAGNOSIS — Z79899 Other long term (current) drug therapy: Secondary | ICD-10-CM | POA: Diagnosis not present

## 2019-06-20 DIAGNOSIS — Z7984 Long term (current) use of oral hypoglycemic drugs: Secondary | ICD-10-CM | POA: Diagnosis not present

## 2019-06-20 DIAGNOSIS — Z992 Dependence on renal dialysis: Secondary | ICD-10-CM | POA: Diagnosis not present

## 2019-06-20 DIAGNOSIS — E1122 Type 2 diabetes mellitus with diabetic chronic kidney disease: Secondary | ICD-10-CM | POA: Diagnosis not present

## 2019-06-20 DIAGNOSIS — Z87891 Personal history of nicotine dependence: Secondary | ICD-10-CM | POA: Insufficient documentation

## 2019-06-20 DIAGNOSIS — I120 Hypertensive chronic kidney disease with stage 5 chronic kidney disease or end stage renal disease: Secondary | ICD-10-CM | POA: Diagnosis not present

## 2019-06-20 DIAGNOSIS — N186 End stage renal disease: Secondary | ICD-10-CM | POA: Insufficient documentation

## 2019-06-20 LAB — CBC WITH DIFFERENTIAL/PLATELET
Abs Immature Granulocytes: 0.09 K/uL — ABNORMAL HIGH (ref 0.00–0.07)
Basophils Absolute: 0.1 K/uL (ref 0.0–0.1)
Basophils Relative: 0 %
Eosinophils Absolute: 0.1 K/uL (ref 0.0–0.5)
Eosinophils Relative: 1 %
HCT: 33.5 % — ABNORMAL LOW (ref 39.0–52.0)
Hemoglobin: 10.5 g/dL — ABNORMAL LOW (ref 13.0–17.0)
Immature Granulocytes: 1 %
Lymphocytes Relative: 1 %
Lymphs Abs: 0.2 K/uL — ABNORMAL LOW (ref 0.7–4.0)
MCH: 28.7 pg (ref 26.0–34.0)
MCHC: 31.3 g/dL (ref 30.0–36.0)
MCV: 91.5 fL (ref 80.0–100.0)
Monocytes Absolute: 0.8 K/uL (ref 0.1–1.0)
Monocytes Relative: 4 %
Neutro Abs: 18.3 K/uL — ABNORMAL HIGH (ref 1.7–7.7)
Neutrophils Relative %: 93 %
Platelets: 171 K/uL (ref 150–400)
RBC: 3.66 MIL/uL — ABNORMAL LOW (ref 4.22–5.81)
RDW: 15.8 % — ABNORMAL HIGH (ref 11.5–15.5)
WBC: 19.6 K/uL — ABNORMAL HIGH (ref 4.0–10.5)
nRBC: 0 % (ref 0.0–0.2)

## 2019-06-20 LAB — COMPREHENSIVE METABOLIC PANEL
ALT: 24 U/L (ref 0–44)
AST: 25 U/L (ref 15–41)
Albumin: 3.4 g/dL — ABNORMAL LOW (ref 3.5–5.0)
Alkaline Phosphatase: 63 U/L (ref 38–126)
Anion gap: 14 (ref 5–15)
BUN: 34 mg/dL — ABNORMAL HIGH (ref 8–23)
CO2: 24 mmol/L (ref 22–32)
Calcium: 8.3 mg/dL — ABNORMAL LOW (ref 8.9–10.3)
Chloride: 96 mmol/L — ABNORMAL LOW (ref 98–111)
Creatinine, Ser: 5.39 mg/dL — ABNORMAL HIGH (ref 0.61–1.24)
GFR calc Af Amer: 11 mL/min — ABNORMAL LOW (ref >60)
GFR calc non Af Amer: 10 mL/min — ABNORMAL LOW (ref >60)
Glucose, Bld: 190 mg/dL — ABNORMAL HIGH (ref 70–99)
Potassium: 3.9 mmol/L (ref 3.5–5.1)
Sodium: 134 mmol/L — ABNORMAL LOW (ref 135–145)
Total Bilirubin: 0.8 mg/dL (ref 0.3–1.2)
Total Protein: 6.8 g/dL (ref 6.5–8.1)

## 2019-06-20 LAB — LIPASE, BLOOD: Lipase: 27 U/L (ref 11–51)

## 2019-06-20 LAB — LACTIC ACID, PLASMA: Lactic Acid, Venous: 1.9 mmol/L (ref 0.5–1.9)

## 2019-06-20 LAB — PROTIME-INR
INR: 1.2 (ref 0.8–1.2)
Prothrombin Time: 14.6 s (ref 11.4–15.2)

## 2019-06-20 LAB — TYPE AND SCREEN
ABO/RH(D): O POS
Antibody Screen: NEGATIVE

## 2019-06-20 LAB — SARS CORONAVIRUS 2 BY RT PCR (HOSPITAL ORDER, PERFORMED IN ~~LOC~~ HOSPITAL LAB): SARS Coronavirus 2: NEGATIVE

## 2019-06-20 NOTE — ED Provider Notes (Signed)
[facility] EMERGENCY DEPARTMENT Provider Note   CSN: [id number] Arrival date & time: 06/20/19  1249     History   Chief Complaint Chief Complaint  Patient presents with   fever/ emesis    HPI Louis Bradley is a 73 y.o. male.     The history is provided by the patient and medical records. No language interpreter was used.  Fever Max temp prior to arrival:  102 Temp source:  Oral Severity:  Moderate Onset quality:  Gradual Duration:  1 day Timing:  Constant Progression:  Waxing and waning Chronicity:  New Relieved by:  Nothing Worsened by:  Nothing Ineffective treatments:  None tried Associated symptoms: chills, nausea and vomiting   Associated symptoms: no chest pain, no congestion, no cough, no diarrhea, no headaches, no myalgias, no rash and no rhinorrhea   Risk factors: recent sickness     Past Medical History:  Diagnosis Date   Anemia    Anesthesia complication    General anesthesia makes me feel foggy for long periods of time   Arthritis    CKD (chronic kidney disease) stage V requiring chronic dialysis ([place])    Stage 4-5   Complication of anesthesia    slow to come out of anesthesia - a lot of confusion    Diabetes (HCC)    Fournier gangrene    GI bleed    Heart murmur    Born with, no problems   History of blood transfusion    x2   Hyperlipidemia    Hypertension    Moderate aortic stenosis    NSTEMI (non-ST elevated myocardial infarction) ([place])    Obesity    OSA on CPAP    Osteomyelitis ([place]) 2016   L toe   Pulmonary hypertension ([place])    Renal atrophy, left    Vitamin D deficiency     Patient Active Problem List   Diagnosis Date Noted   Acute GI bleeding 05/25/2019   ESRD on dialysis ([place]) 05/25/2019   Atrial fibrillation ([place]) 05/14/2019   Volume overload 05/14/2019   Pulmonary hypertension ([place]) 05/14/2019   Hypotension 05/12/2019   Pneumonia 05/12/2019   Acute respiratory failure  ([place]) 05/12/2019   Acute lower UTI 05/12/2019   GI bleed 05/10/2019   Acute blood loss anemia 05/10/2019   Amputated toe, left ([place]) 05/02/2019   Onychomycosis 05/02/2019   Venous insufficiency of both lower extremities 05/02/2019   Hyperlipidemia associated with type 2 diabetes mellitus ([place]) 04/26/2019   Osteomyelitis of third toe of left foot ([place]) 04/17/2019   Osteomyelitis of second toe of left foot ([place]) 04/17/2019   Bilateral primary osteoarthritis of knee 04/17/2019   PCP NOTES >>>>>>>>>>>>>>> 03/01/2019   Aortic stenosis 12/18/2018   Iron deficiency anemia 09/09/2018   S/P colostomy ([place]) 09/05/2016   Vitamin D deficiency 04/26/2016   Chronic renal failure in pediatric patient, stage 4 (severe) ([place]) 04/09/2015   Diabetes mellitus type 2 with complications ([place]) 31/51/7616   Essential hypertension 04/08/2015   Hyponatremia 04/08/2015   Hyperkalemia 05/22/2012    Past Surgical History:  Procedure Laterality Date   AMPUTATION TOE Left 04/20/2019   Procedure: AMPUTATION LEFT SECOND AND THIRD TOES;  Surgeon: Newt Minion, MD;  Location: [facility];  Service: Orthopedics;  Laterality: Left;   AV FISTULA PLACEMENT Left 05/16/2019   Procedure: ARTERIOVENOUS (AV) FISTULA CREATION;  Surgeon: Serafina Mitchell, MD;  Location: [facility];  Service: Vascular;  Laterality: Left;   BIOPSY  05/13/2019  Procedure: BIOPSY;  Surgeon: Laurence Spates, MD;  Location: [facility] ENDOSCOPY;  Service: Endoscopy;;   BIOPSY  05/29/2019   Procedure: BIOPSY;  Surgeon: Wonda Horner, MD;  Location: [facility] ENDOSCOPY;  Service: Endoscopy;;   COLONOSCOPY     COLOSTOMY  03/2015   COLOSTOMY REVERSAL     ENTEROSCOPY N/A 05/29/2019   Procedure: ENTEROSCOPY;  Surgeon: Wonda Horner, MD;  Location: [facility] ENDOSCOPY;  Service: Endoscopy;  Laterality: N/A;   ESOPHAGOGASTRODUODENOSCOPY N/A 05/13/2019   Procedure: ESOPHAGOGASTRODUODENOSCOPY (EGD);  Surgeon: Laurence Spates, MD;  Location: [facility] ENDOSCOPY;  Service:  Endoscopy;  Laterality: N/A;   GIVENS CAPSULE STUDY N/A 05/27/2019   Procedure: GIVENS CAPSULE STUDY;  Surgeon: Wonda Horner, MD;  Location: [facility] ENDOSCOPY;  Service: Endoscopy;  Laterality: N/A;   INSERTION OF DIALYSIS CATHETER Right 05/16/2019   Procedure: INSERTION OF DIALYSIS CATHETER;  Surgeon: Serafina Mitchell, MD;  Location: [facility] OR;  Service: Vascular;  Laterality: Right;   IR THORACENTESIS ASP PLEURAL SPACE W/IMG GUIDE  05/15/2019   TOE AMPUTATION Left 2016   Left big toe amputated due to MRSA infection        Home Medications    Prior to Admission medications   Medication Sig Start Date End Date Taking? Authorizing Provider  calcitRIOL (ROCALTROL) 0.25 MCG capsule Take 0.25 mcg by mouth every Monday, Wednesday, and Friday.  03/22/19   [provider]  calcium acetate (PHOSLO) 667 MG capsule Take 1 capsule (667 mg total) by mouth 3 (three) times daily with meals. 05/18/19   Shelly Coss, MD  carvedilol (COREG) 3.125 MG tablet Take 1 tablet (3.125 mg total) by mouth 2 (two) times daily with a meal. 05/18/19   Shelly Coss, MD  Diclofenac Sodium 1 % CREA Place 1 application onto the skin 2 (two) times daily as needed. 06/08/19   Colon Branch, MD  glipiZIDE (GLUCOTROL) 5 MG tablet Take 2 tablets (10 mg total) by mouth 2 (two) times daily. 04/20/19   Colon Branch, MD  nicotine polacrilex (COMMIT) 4 MG lozenge Take 4 mg by mouth as needed for smoking cessation.    [provider]  pantoprazole (PROTONIX) 40 MG tablet Take 1 tablet (40 mg total) by mouth 2 (two) times daily. 05/11/19   Allie Bossier, MD  pravastatin (PRAVACHOL) 40 MG tablet Take 1 tablet (40 mg total) by mouth at bedtime. 03/13/19   Colon Branch, MD  Probiotic Product (PROBIOTIC DAILY PO) Take 1 tablet by mouth every Monday, Wednesday, and Friday.     [provider]    Family History Family History  Problem Relation Age of Onset   CAD Father 75   CAD Other        2 uncles had heart attacks     Colon cancer Neg Hx    Prostate cancer Neg Hx     Social History Social History   Tobacco Use   Smoking status: Former Smoker   Smokeless tobacco: Never Used   Tobacco comment: quit 12/2018 (1 ppd)  Substance Use Topics   Alcohol use: Yes    Comment: social   Drug use: Never     Allergies   Patient has no known allergies.   Review of Systems Review of Systems  Constitutional: Positive for chills, fatigue and fever. Negative for diaphoresis.  HENT: Negative for congestion and rhinorrhea.   Respiratory: Negative for cough, chest tightness, shortness of breath and wheezing.   Cardiovascular: Negative for chest pain, palpitations and leg swelling.  Gastrointestinal: Positive for nausea and vomiting. Negative for abdominal pain, anal bleeding, constipation and diarrhea.  Genitourinary: Negative for flank pain.  Musculoskeletal: Negative for back pain, myalgias, neck pain and neck stiffness.  Skin: Negative for rash and wound.  Neurological: Negative for light-headedness and headaches.  Psychiatric/Behavioral: Negative for agitation.  All other systems reviewed and are negative.    Physical Exam Updated Vital Signs BP (!) 106/53 (BP Location: Right Arm)    Pulse (!) 105    Temp (!) 100.4 F (38 C) (Oral)    Resp 18    SpO2 96%   Physical Exam Vitals signs and nursing note reviewed.  Constitutional:      General: He is not in acute distress.    Appearance: He is well-developed. He is not ill-appearing, toxic-appearing or diaphoretic.  HENT:     Head: Normocephalic and atraumatic.     Nose: No congestion or rhinorrhea.     Mouth/Throat:     Pharynx: No oropharyngeal exudate or posterior oropharyngeal erythema.  Eyes:     Conjunctiva/sclera: Conjunctivae normal.     Pupils: Pupils are equal, round, and reactive to light.  Neck:     Musculoskeletal: Neck supple. No muscular tenderness.  Cardiovascular:     Rate and Rhythm: Regular rhythm. Tachycardia present.      Heart sounds: No murmur.  Pulmonary:     Effort: Pulmonary effort is normal. No respiratory distress.     Breath sounds: Rhonchi present. No wheezing or rales.  Chest:     Chest wall: No tenderness.  Abdominal:     General: Abdomen is flat. There is no distension.     Palpations: Abdomen is soft.     Tenderness: There is no abdominal tenderness.  Musculoskeletal:        General: No tenderness.  Skin:    General: Skin is warm and dry.     Capillary Refill: Capillary refill takes less than 2 seconds.     Findings: No erythema or rash.  Neurological:     Mental Status: He is alert and oriented to person, place, and time.      ED Treatments / Results  Labs (all labs ordered are listed, but only abnormal results are displayed) Labs Reviewed  COMPREHENSIVE METABOLIC PANEL - Abnormal; Notable for the following components:      Result Value   Sodium 134 (*)    Chloride 96 (*)    Glucose, Bld 190 (*)    BUN 34 (*)    Creatinine, Ser 5.39 (*)    Calcium 8.3 (*)    Albumin 3.4 (*)    GFR calc non Af Amer 10 (*)    GFR calc Af Amer 11 (*)    All other components within normal limits  CBC WITH DIFFERENTIAL/PLATELET - Abnormal; Notable for the following components:   WBC 19.6 (*)    RBC 3.66 (*)    Hemoglobin 10.5 (*)    HCT 33.5 (*)    RDW 15.8 (*)    Neutro Abs 18.3 (*)    Lymphs Abs 0.2 (*)    Abs Immature Granulocytes 0.09 (*)    All other components within normal limits  CULTURE, BLOOD (ROUTINE X 2)  CULTURE, BLOOD (ROUTINE X 2)  SARS CORONAVIRUS 2 (HOSPITAL ORDER, [place] [facility])  URINE CULTURE  LACTIC ACID, PLASMA  PROTIME-INR  LIPASE, BLOOD  LACTIC ACID, PLASMA  URINALYSIS, ROUTINE W REFLEX MICROSCOPIC  TYPE AND SCREEN    EKG  EKG Interpretation  Date/Time:  Wednesday June 20 2019 13:27:10 EDT Ventricular Rate:  100 PR Interval:    QRS Duration: 88 QT Interval:  320 QTC Calculation: 413 R Axis:     Text Interpretation:   Sinus tachycardia Nonspecific repol abnormality, diffuse leads When compared to prior, faster rate.  No STEMI Confirmed by Antony Blackbird [phone]) on 06/20/2019 1:41:36 PM   Radiology Dg Chest Portable 1 View  Result Date: 06/20/2019 CLINICAL DATA:  Fever, vomiting EXAM: PORTABLE CHEST 1 VIEW COMPARISON:  05/25/2019 FINDINGS: Right-sided dual lumen hemodialysis catheter unchanged in positioning. Cardiomediastinal contours are within normal limits. Mildly decreased lung volumes. No focal airspace consolidation, pleural effusion, or pneumothorax. IMPRESSION: No acute cardiopulmonary findings. Electronically Signed   By: Davina Poke M.D.   On: 06/20/2019 15:06    Procedures Procedures (including critical care time)  Medications Ordered in ED Medications - No data to display   Initial Impression / Assessment and Plan / ED Course  I have reviewed the triage vital signs and the nursing notes.  Pertinent labs & imaging results that were available during my care of the patient were reviewed by me and considered in my medical decision making (see chart for details).        Louis Bradley is a 73 y.o. male with a past medical history significant for ESRD taking dialysis TTS, diabetes, CAD, hypertension, hyperlipidemia, pulmonary hypertension, sleep apnea, and recent GI bleed with admission several weeks ago for symptomatic anemia with GI bleed who presents with fevers, fatigue, nausea, vomiting.  Patient reports that he was admitted last month for symptomatic anemia with a GI bleed and received blood.  He reports that he has done well but today woke up and had fever.  He reports he had several episodes of nausea and vomiting with no blood in his emesis.  He reports feeling very fatigued and tired but reports he has not had any change in his bowel movements including no evidence of GI bleed recurrence.  He reports no chest pain, palpitations, shortness of breath.  He denies any cough or urinary  symptoms as he does still make some urine.  He denies being around any sick contacts.  He denies other complaints.  On arrival, patient was found to be febrile with temperature 102.9.  He is not hypotensive or tachypneic but does have slight tachycardia.  Patient's lungs had some rhonchi on my exam.  Abdomen was nontender and chest was nontender.  Legs were nontender and not edematous.  Patient resting comfortably.  Patient does appear pale on my exam.  We will do work-up to look for occult infection causing the patient's fever, fatigue, chills, nausea, vomiting.  We will get screening labs as well as type and screen in case she is having anemia again causing his fatigue.  Will get chest x-ray and urinalysis if he is able to urinate for [place].  We will get coronavirus test.  Anticipate reassessment after work-up.  Care transferred to oncoming team with results of diagnostic work-up.    Final Clinical Impressions(s) / ED Diagnoses   Final diagnoses:  Fever, unspecified fever cause  Non-intractable vomiting with nausea, unspecified vomiting type    Clinical Impression: 1. Fever, unspecified fever cause   2. Non-intractable vomiting with nausea, unspecified vomiting type     Disposition: Awaiting results of diagnostic work-up to determine disposition with patient's fatigue, nausea, vomiting, and fever.  This note was prepared with assistance of Systems analyst. Occasional wrong-word or  sound-a-like substitutions may have occurred due to the inherent limitations of voice recognition software.      Mahima Hottle, Gwenyth Allegra, MD 06/20/19 1556

## 2019-06-20 NOTE — ED Provider Notes (Signed)
10:53 PM Assumed care from Dr. Sherry Ruffing, please see their note for full history, physical and decision making until this point. In brief this is a 73 y.o. year old male who presented to the ED tonight with fever/ emesis     Dialysis with fever. Vomiting, weakness, unsteadiness. Rhonchi on left. Tachypneic, febrile, otherwise appears overall well. Pending labs/xr and likely admission.   On reevaluation patient is afebrile for multiple hours.  Patient states that his emesis has dissipated and he is tolerating p.o.  He is not nauseous.  He states he has no other symptoms.  On my exam his lungs are clear.  He has a benign abdominal exam.  Is no rashes.  No evidence of meningitis.  At this time I do not know what the cause of his fever and vomiting earlier however they seem to have resolved even after antipyretics and without any other intervention.  Shared decision making with the patient he decided to go home and will return if symptoms worsen or change in any other way.  Discharge instructions, including strict return precautions for new or worsening symptoms, given. Patient and/or family verbalized understanding and agreement with the plan as described.   Labs, studies and imaging reviewed by myself and considered in medical decision making if ordered. Imaging interpreted by radiology.  Labs Reviewed  COMPREHENSIVE METABOLIC PANEL - Abnormal; Notable for the following components:      Result Value   Sodium 134 (*)    Chloride 96 (*)    Glucose, Bld 190 (*)    BUN 34 (*)    Creatinine, Ser 5.39 (*)    Calcium 8.3 (*)    Albumin 3.4 (*)    GFR calc non Af Amer 10 (*)    GFR calc Af Amer 11 (*)    All other components within normal limits  CBC WITH DIFFERENTIAL/PLATELET - Abnormal; Notable for the following components:   WBC 19.6 (*)    RBC 3.66 (*)    Hemoglobin 10.5 (*)    HCT 33.5 (*)    RDW 15.8 (*)    Neutro Abs 18.3 (*)    Lymphs Abs 0.2 (*)    Abs Immature Granulocytes 0.09 (*)    All other components within normal limits  SARS CORONAVIRUS 2 (HOSPITAL ORDER, [place] [facility])  CULTURE, BLOOD (ROUTINE X 2)  CULTURE, BLOOD (ROUTINE X 2)  URINE CULTURE  LACTIC ACID, PLASMA  PROTIME-INR  LIPASE, BLOOD  TYPE AND SCREEN    DG Chest Portable 1 View  Final Result      No follow-ups on file.    Merrily Pew, MD 06/20/19 2253

## 2019-06-20 NOTE — ED Triage Notes (Signed)
Patient complains of fever that started this am and vomiting since awakening. Due for dialysis yesterday, weakness and unsteadiness with same. denies abd. Pain, no diarrhea

## 2019-06-21 ENCOUNTER — Ambulatory Visit: Payer: Medicare Other | Admitting: Internal Medicine

## 2019-06-25 ENCOUNTER — Other Ambulatory Visit: Payer: Self-pay | Admitting: Internal Medicine

## 2019-06-25 ENCOUNTER — Other Ambulatory Visit: Payer: Self-pay

## 2019-06-25 LAB — CULTURE, BLOOD (ROUTINE X 2)
Culture: NO GROWTH
Culture: NO GROWTH

## 2019-06-25 MED ORDER — PANTOPRAZOLE SODIUM 40 MG PO TBEC: 40.0000 mg | DELAYED_RELEASE_TABLET | Freq: Two times a day (BID) | ORAL | 3 refills | Status: AC

## 2019-06-26 ENCOUNTER — Other Ambulatory Visit: Payer: Self-pay

## 2019-06-26 DIAGNOSIS — N186 End stage renal disease: Secondary | ICD-10-CM

## 2019-06-27 ENCOUNTER — Telehealth: Payer: Self-pay | Admitting: Internal Medicine

## 2019-06-27 LAB — ACID FAST CULTURE WITH REFLEXED SENSITIVITIES (MYCOBACTERIA): Acid Fast Culture: NEGATIVE

## 2019-06-27 MED ORDER — CARVEDILOL 3.125 MG PO TABS: 3.1250 mg | ORAL_TABLET | Freq: Two times a day (BID) | ORAL | 6 refills | Status: AC

## 2019-06-27 NOTE — Telephone Encounter (Signed)
Copied from [place] [id number]. Topic: Quick Communication - Rx Refill/Question >> Jun 27, 2019  1:01 PM Mcneil, Ja-Kwan wrote: Medication: calcium acetate (PHOSLO) 667 MG capsule and carvedilol (COREG) 3.125 MG tablet  Has the patient contacted their pharmacy? yes   Preferred Pharmacy (with phone number or street name): [id number] [place], [place] - [address] [place] [phone] (Phone) [phone] (Fax)  Agent: Please be advised that RX refills may take up to 3 business days. We ask that you follow-up with your pharmacy.

## 2019-06-27 NOTE — Telephone Encounter (Signed)
Please advise if okay to refill calcium acetate.

## 2019-06-27 NOTE — Telephone Encounter (Signed)
Okay refill carvedilol PhosLo per nephrology

## 2019-06-27 NOTE — Telephone Encounter (Signed)
Rx for carvedilol sent.

## 2019-06-29 ENCOUNTER — Telehealth (HOSPITAL_COMMUNITY): Payer: Self-pay | Admitting: Rehabilitation

## 2019-06-29 NOTE — Telephone Encounter (Signed)

## 2019-07-02 ENCOUNTER — Ambulatory Visit (HOSPITAL_COMMUNITY)
Admission: RE | Admit: 2019-07-02 | Discharge: 2019-07-02 | Disposition: A | Discharge status: Home / Self Care | Payer: Medicare Other | Source: Ambulatory Visit | Attending: Family | Admitting: Family

## 2019-07-02 ENCOUNTER — Encounter (HOSPITAL_COMMUNITY): Payer: Medicare Other

## 2019-07-02 ENCOUNTER — Ambulatory Visit (INDEPENDENT_AMBULATORY_CARE_PROVIDER_SITE_OTHER): Payer: Self-pay | Admitting: Physician Assistant

## 2019-07-02 ENCOUNTER — Other Ambulatory Visit: Payer: Self-pay

## 2019-07-02 VITALS — BP 137/62 | HR 64 | Temp 97.8°F | Resp 14 | Ht 75.0 in | Wt 285.0 lb

## 2019-07-02 DIAGNOSIS — N186 End stage renal disease: Secondary | ICD-10-CM

## 2019-07-02 NOTE — Progress Notes (Signed)
POST OPERATIVE OFFICE NOTE    CC:  F/u for surgery  HPI:  This is a 73 y.o. male who is s/p Left BC fistula and TDC placement.  He is here today for f/u fistula duplex to check for maturity.  He denise pain, loss of sensation or loss of motor in the left UE. No Known Allergies  Current Outpatient Medications  Medication Sig Dispense Refill  . calcitRIOL (ROCALTROL) 0.25 MCG capsule Take 0.25 mcg by mouth every Monday, Wednesday, and Friday.     . calcium acetate (PHOSLO) 667 MG capsule Take 1 capsule (667 mg total) by mouth 3 (three) times daily with meals. 90 capsule 0  . carvedilol (COREG) 3.125 MG tablet Take 1 tablet (3.125 mg total) by mouth 2 (two) times daily with a meal. 60 tablet 6  . Diclofenac Sodium 1 % CREA Place 1 application onto the skin 2 (two) times daily as needed. (Patient taking differently: Place 1 application onto the skin 2 (two) times daily as needed (joint pain). ) 120 g 3  . glipiZIDE (GLUCOTROL) 5 MG tablet Take 2 tablets (10 mg total) by mouth 2 (two) times daily. 360 tablet 1  . nicotine polacrilex (COMMIT) 4 MG lozenge Take 4 mg by mouth as needed for smoking cessation.    . pantoprazole (PROTONIX) 40 MG tablet Take 1 tablet (40 mg total) by mouth 2 (two) times daily before a meal. 180 tablet 3  . pravastatin (PRAVACHOL) 40 MG tablet Take 1 tablet (40 mg total) by mouth at bedtime. 90 tablet 1  . Probiotic Product (PROBIOTIC DAILY PO) Take 1 tablet by mouth every Monday, Wednesday, and Friday.      No current facility-administered medications for this visit.      ROS:  See HPI  Physical Exam:  +------------+----------+-------------+----------+--------+ OUTFLOW VEINPSV (cm/s)Diameter (cm)Depth (cm)Describe +------------+----------+-------------+----------+--------+ Shoulder       181        0.78        0.72            +------------+----------+-------------+----------+--------+ Prox UA        173        0.82        0.59             +------------+----------+-------------+----------+--------+ Mid UA         367        0.80        0.43            +------------+----------+-------------+----------+--------+ Dist UA        569        0.93        0.43            +------------+----------+-------------+----------+--------+ AC Fossa       240        1.01        0.70            +------------+----------+-------------+----------+--------+   Incision:  Well healed Extremities:  Palpable radial pulse, grip 5/5, sensation intact.  Palpable thrill throughout the fistula.   Assessment/Plan:  This is a 73 y.o. male who is 6 weeks s/p: Left BC AV fistula creation   He has a nice maturing fistula without symptoms of steal.  The fistula can be utilized for HD on 08/16/2019. Once the fistula is working well he can be scheduled for [facility] removal.  He will f/u PRN.   Roxy Horseman PA-C [facility] [phone]  Clinic MD:  Donzetta Matters

## 2019-07-04 ENCOUNTER — Telehealth: Payer: Self-pay | Admitting: Internal Medicine

## 2019-07-04 NOTE — Telephone Encounter (Signed)
See previous telephone notes about this- that medication should come from nephrology.

## 2019-07-04 NOTE — Telephone Encounter (Signed)
Medication Refill - Medication: calcium acetate (PHOSLO) 667 MG capsule/ Pt stated the pharmacy has reached out for a refill multiple times with no result. Pt is currently out of medication and is requesting refill today. Also requesting a callback from Dr. Larose Kells' assistant. CB#[phone]  Has the patient contacted their pharmacy? Yes.   (Agent: If no, request that the patient contact the pharmacy for the refill.) (Agent: If yes, when and what did the pharmacy advise?)  Preferred Pharmacy (with phone number or street name):  [id number] [place], [place] - [place] [phone] (Phone) [phone] (Fax)     Agent: Please be advised that RX refills may take up to 3 business days. We ask that you follow-up with your pharmacy.

## 2019-07-05 NOTE — Telephone Encounter (Signed)
Pt stated that he spoke with Dr. Larose Kells to take over this medication and Dr. Larose Kells said it wouldn't be a problem

## 2019-08-22 ENCOUNTER — Ambulatory Visit (INDEPENDENT_AMBULATORY_CARE_PROVIDER_SITE_OTHER): Payer: Medicare Other | Admitting: Family

## 2019-08-22 ENCOUNTER — Ambulatory Visit (INDEPENDENT_AMBULATORY_CARE_PROVIDER_SITE_OTHER): Payer: Medicare Other

## 2019-08-22 ENCOUNTER — Encounter: Payer: Self-pay | Admitting: Family

## 2019-08-22 ENCOUNTER — Other Ambulatory Visit: Payer: Self-pay

## 2019-08-22 VITALS — Ht 75.0 in | Wt 285.0 lb

## 2019-08-22 DIAGNOSIS — Z89422 Acquired absence of other left toe(s): Secondary | ICD-10-CM

## 2019-08-22 DIAGNOSIS — S98132A Complete traumatic amputation of one left lesser toe, initial encounter: Secondary | ICD-10-CM

## 2019-08-22 DIAGNOSIS — L97521 Non-pressure chronic ulcer of other part of left foot limited to breakdown of skin: Secondary | ICD-10-CM

## 2019-08-22 DIAGNOSIS — B351 Tinea unguium: Secondary | ICD-10-CM | POA: Diagnosis not present

## 2019-08-22 DIAGNOSIS — Z89412 Acquired absence of left great toe: Secondary | ICD-10-CM

## 2019-08-22 MED ORDER — DOXYCYCLINE HYCLATE 100 MG PO TABS: 100.0000 mg | ORAL_TABLET | Freq: Two times a day (BID) | ORAL | 0 refills | Status: DC

## 2019-08-22 NOTE — Progress Notes (Signed)
Office Visit Note   Patient: Louis Bradley           Date of Birth: 10-Apr-1946           MRN: [id number] Visit Date: 08/22/2019              Requested by: Colon Branch, [address] [place],  [place] [address] PCP: Colon Branch, MD  Chief Complaint  Patient presents with  . Left Foot - Pain    4th toe left foot      HPI: The patient is a 73 year old gentleman who presents today with a concern of a new ulcer to his left fourth toe this came on after he trimmed his toenails about 5 days ago he has not had any drainage however there is some swelling and redness to the toe this is not very painful for him he is also requesting a nail trim.  He is a controlled type II diabetic he is also on dialysis.  Is using a power scooter for mobility  Assessment & Plan: Visit Diagnoses:  1. Ulcer of toe of left foot, limited to breakdown of skin ([place])   2. Amputated toe, left ([place])   3. Onychomycosis     Plan: We will place him on doxycycline with antibacterial ointment dressing changes for the next 2 weeks we will monitor this closely.  Discussed return precautions.    Follow-Up Instructions: Return in about 2 weeks (around 09/05/2019).   Ortho Exam  Patient is alert, oriented, no adenopathy, well-dressed, normal affect, normal respiratory effort. On examination of the left foot the first through third toes are surgically absent the fourth toe does have some edema and erythema there is a distal callused ulceration there is some serosanguineous drainage after debridement.  There is no purulence this does not probe to bone.  Does have thickened and discolored onychomycotic nails x5 these were trimmed today without incident as the patient is unable to safely trim his own nails.  Imaging: No results found. No images are attached to the encounter.  Labs: Lab Results  Component Value Date   HGBA1C 5.9 (H) 05/11/2019   REPTSTATUS 06/25/2019 FINAL 06/20/2019   GRAMSTAIN   05/15/2019    WBC PRESENT,BOTH PMN AND MONONUCLEAR NO ORGANISMS SEEN CYTOSPIN SMEAR Performed at [facility], [address], [place], [place] [address]    CULT  06/20/2019    NO GROWTH 5 DAYS Performed at [facility] [address], [place], [place] [address]      Lab Results  Component Value Date   ALBUMIN 3.4 (L) 06/20/2019   ALBUMIN 2.7 (L) 05/30/2019   ALBUMIN 3.1 (L) 05/25/2019    Lab Results  Component Value Date   MG 2.1 05/18/2019   MG 2.2 05/17/2019   MG 3.3 (H) 05/16/2019   No results found for: VD25OH  No results found for: PREALBUMIN CBC EXTENDED Latest Ref Rng & Units 06/20/2019 06/01/2019 05/31/2019  WBC 4.0 - 10.5 K/uL 19.6(H) 7.4 6.5  RBC 4.22 - 5.81 MIL/uL 3.66(L) 2.59(L) 2.61(L)  HGB 13.0 - 17.0 g/dL 10.5(L) 7.7(L) 7.9(L)  HCT 39.0 - 52.0 % 33.5(L) 24.8(L) 24.7(L)  PLT 150 - 400 K/uL 171 159 139(L)  NEUTROABS 1.7 - 7.7 K/uL 18.3(H) - -  LYMPHSABS 0.7 - 4.0 K/uL 0.2(L) - -     Body mass index is 35.62 kg/m.  Orders:  Orders Placed This Encounter  Procedures  . XR Foot  2 Views Left   No orders of the defined types were placed in this encounter.    Procedures: No procedures performed  Clinical Data: No additional findings.  ROS:  All other systems negative, except as noted in the HPI. Review of Systems  Cardiovascular: Negative for leg swelling.  Skin: Positive for color change and wound.    Objective: Vital Signs: Ht 6\' 3"  (1.905 m)   Wt 285 lb (129.3 kg)   BMI 35.62 kg/m   Specialty Comments:  No specialty comments available.  PMFS History: Patient Active Problem List   Diagnosis Date Noted  . Acute GI bleeding 05/25/2019  . ESRD on dialysis ([place]) 05/25/2019  . Atrial fibrillation ([place]) 05/14/2019  . Volume overload 05/14/2019  . Pulmonary hypertension ([place]) 05/14/2019  . Hypotension 05/12/2019  . Pneumonia 05/12/2019  . Acute respiratory failure ([place]) 05/12/2019  . Acute lower UTI 05/12/2019  . GI  bleed 05/10/2019  . Acute blood loss anemia 05/10/2019  . Amputated toe, left ([place]) 05/02/2019  . Onychomycosis 05/02/2019  . Venous insufficiency of both lower extremities 05/02/2019  . Hyperlipidemia associated with type 2 diabetes mellitus ([place]) 04/26/2019  . Osteomyelitis of third toe of left foot ([place]) 04/17/2019  . Osteomyelitis of second toe of left foot ([place]) 04/17/2019  . Bilateral primary osteoarthritis of knee 04/17/2019  . PCP NOTES >>>>>>>>>>>>>>> 03/01/2019  . Aortic stenosis 12/18/2018  . Iron deficiency anemia 09/09/2018  . S/P colostomy ([place]) 09/05/2016  . Vitamin D deficiency 04/26/2016  . Chronic renal failure in pediatric patient, stage 4 (severe) ([place]) 04/09/2015  . Diabetes mellitus type 2 with complications ([place]) 0000000  . Essential hypertension 04/08/2015  . Hyponatremia 04/08/2015  . Hyperkalemia 05/22/2012   Past Medical History:  Diagnosis Date  . Anemia   . Anesthesia complication    General anesthesia makes me feel foggy for long periods of time  . Arthritis   . CKD (chronic kidney disease) stage V requiring chronic dialysis ([place])    Stage 4-5  . Complication of anesthesia    slow to come out of anesthesia - a lot of confusion   . Diabetes ([place])   . Fournier gangrene   . GI bleed   . Heart murmur    Born with, no problems  . History of blood transfusion    x2  . Hyperlipidemia   . Hypertension   . Moderate aortic stenosis   . NSTEMI (non-ST elevated myocardial infarction) ([place])   . Obesity   . OSA on CPAP   . Osteomyelitis ([place]) 2016   L toe  . Pulmonary hypertension ([place])   . Renal atrophy, left   . Vitamin D deficiency     Family History  Problem Relation Age of Onset  . CAD Father 66  . CAD Other        2 uncles had heart attacks  . Colon cancer Neg Hx   . Prostate cancer Neg Hx     Past Surgical History:  Procedure Laterality Date  . AMPUTATION TOE Left 04/20/2019   Procedure: AMPUTATION LEFT SECOND AND THIRD TOES;   Surgeon: Newt Minion, MD;  Location: [facility];  Service: Orthopedics;  Laterality: Left;  . AV FISTULA PLACEMENT Left 05/16/2019   Procedure: ARTERIOVENOUS (AV) FISTULA CREATION;  Surgeon: Serafina Mitchell, MD;  Location: [facility];  Service: Vascular;  Laterality: Left;  . BIOPSY  05/13/2019   Procedure: BIOPSY;  Surgeon: Laurence Spates, MD;  Location: [facility];  Service: Endoscopy;;  .  BIOPSY  05/29/2019   Procedure: BIOPSY;  Surgeon: Wonda Horner, MD;  Location: [facility] ENDOSCOPY;  Service: Endoscopy;;  . COLONOSCOPY    . COLOSTOMY  03/2015  . COLOSTOMY REVERSAL    . ENTEROSCOPY N/A 05/29/2019   Procedure: ENTEROSCOPY;  Surgeon: Wonda Horner, MD;  Location: [facility] ENDOSCOPY;  Service: Endoscopy;  Laterality: N/A;  . ESOPHAGOGASTRODUODENOSCOPY N/A 05/13/2019   Procedure: ESOPHAGOGASTRODUODENOSCOPY (EGD);  Surgeon: Laurence Spates, MD;  Location[facility] ENDOSCOPY;  Service: Endoscopy;  Laterality: N/A;  . GIVENS CAPSULE STUDY N/A 05/27/2019   Procedure: GIVENS CAPSULE STUDY;  Surgeon: Wonda Horner, MD;  Location: [facility] ENDOSCOPY;  Service: Endoscopy;  Laterality: N/A;  . INSERTION OF DIALYSIS CATHETER Right 05/16/2019   Procedure: INSERTION OF DIALYSIS CATHETER;  Surgeon: Serafina Mitchell, MD;  [facility];  Service: Vascular;  Laterality: Right;  . IR THORACENTESIS ASP PLEURAL SPACE W/IMG GUIDE  05/15/2019  . TOE AMPUTATION Left 2016   Left big toe amputated due to MRSA infection   Social History   Occupational History  . Occupation: retired-owned a Environmental manager   . Occupation: retired- Theatre manager  Tobacco Use  . Smoking status: Former Research scientist (life sciences)  . Smokeless tobacco: Never Used  . Tobacco comment: quit 12/2018 (1 ppd)  Substance and Sexual Activity  . Alcohol use: Yes    Comment: social  . Drug use: Never  . Sexual activity: Not on file

## 2019-09-05 ENCOUNTER — Ambulatory Visit (INDEPENDENT_AMBULATORY_CARE_PROVIDER_SITE_OTHER): Payer: Medicare Other | Admitting: Family

## 2019-09-05 ENCOUNTER — Other Ambulatory Visit: Payer: Self-pay

## 2019-09-05 ENCOUNTER — Telehealth: Payer: Self-pay | Admitting: Internal Medicine

## 2019-09-05 ENCOUNTER — Encounter: Payer: Self-pay | Admitting: Family

## 2019-09-05 VITALS — Ht 75.0 in | Wt 285.0 lb

## 2019-09-05 DIAGNOSIS — L97521 Non-pressure chronic ulcer of other part of left foot limited to breakdown of skin: Secondary | ICD-10-CM

## 2019-09-05 NOTE — Telephone Encounter (Signed)
Rx's denied. Will call Pt tomorrow to discuss.

## 2019-09-05 NOTE — Progress Notes (Signed)
Office Visit Note   Patient: Louis Bradley           Date of Birth: 29-May-1946           MRN: [id number] Visit Date: 09/05/2019              Requested by: Colon Branch, [address] [place],  [place] [address] PCP: Colon Branch, MD  Chief Complaint  Patient presents with  . Left Foot - Routine Post Op    04/20/2019 left foot 2nd & 3rd amp      HPI: The patient is a 73 year old gentleman who presents today in follow-up for ulceration and cellulitis to his left foot fourth toe.   He previously had an ulcer from an incident where he trimmed his toenails too deeply and resulted in an ulcer.   He is a controlled type II diabetic he is also on dialysis.  Is using a power scooter for mobility  Assessment & Plan: Visit Diagnoses:  1. Ulcer of toe of left foot, limited to breakdown of skin ([place])     Plan: He will continue to monitor his toe closely follow-up in our office soon for supplemental injection of his knee.  Follow-Up Instructions: Return if symptoms worsen or fail to improve.   Ortho Exam  Patient is alert, oriented, no adenopathy, well-dressed, normal affect, normal respiratory effort. On examination of the left foot the first through third toes are surgically absent the fourth toe has a well-healed ulcer there is some resolving erythema and some peeling skin some minimal swelling there is no open ulcer. Imaging: No results found. No images are attached to the encounter.  Labs: Lab Results  Component Value Date   HGBA1C 5.9 (H) 05/11/2019   REPTSTATUS 06/25/2019 FINAL 06/20/2019   GRAMSTAIN  05/15/2019    WBC PRESENT,BOTH PMN AND MONONUCLEAR NO ORGANISMS SEEN CYTOSPIN SMEAR Performed at [facility], [address], [place], [place] [address]    CULT  06/20/2019    NO GROWTH 5 DAYS Performed at [facility] [address] [place], [place] [address]      Lab Results  Component Value Date   ALBUMIN 3.4 (L) 06/20/2019   ALBUMIN 2.7 (L) 05/30/2019   ALBUMIN 3.1 (L) 05/25/2019    Lab Results  Component Value Date   MG 2.1 05/18/2019   MG 2.2 05/17/2019   MG 3.3 (H) 05/16/2019   No results found for: VD25OH  No results found for: PREALBUMIN CBC EXTENDED Latest Ref Rng & Units 06/20/2019 06/01/2019 05/31/2019  WBC 4.0 - 10.5 K/uL 19.6(H) 7.4 6.5  RBC 4.22 - 5.81 MIL/uL 3.66(L) 2.59(L) 2.61(L)  HGB 13.0 - 17.0 g/dL 10.5(L) 7.7(L) 7.9(L)  HCT 39.0 - 52.0 % 33.5(L) 24.8(L) 24.7(L)  PLT 150 - 400 K/uL 171 159 139(L)  NEUTROABS 1.7 - 7.7 K/uL 18.3(H) - -  LYMPHSABS 0.7 - 4.0 K/uL 0.2(L) - -     Body mass index is 35.62 kg/m.  Orders:  No orders of the defined types were placed in this encounter.  No orders of the defined types were placed in this encounter.    Procedures: No procedures performed  Clinical Data: No additional findings.  ROS:  All other systems negative, except as noted in the HPI. Review of Systems  Cardiovascular: Negative for leg swelling.  Skin: Positive for color change and wound.    Objective: Vital Signs: Ht 6\' 3"  (1.905 m)  Wt 285 lb (129.3 kg)   BMI 35.62 kg/m   Specialty Comments:  No specialty comments available.  PMFS History: Patient Active Problem List   Diagnosis Date Noted  . Acute GI bleeding 05/25/2019  . ESRD on dialysis ([place]) 05/25/2019  . Atrial fibrillation ([place]) 05/14/2019  . Volume overload 05/14/2019  . Pulmonary hypertension ([place]) 05/14/2019  . Hypotension 05/12/2019  . Pneumonia 05/12/2019  . Acute respiratory failure ([place]) 05/12/2019  . Acute lower UTI 05/12/2019  . GI bleed 05/10/2019  . Acute blood loss anemia 05/10/2019  . Amputated toe, left ([place]) 05/02/2019  . Onychomycosis 05/02/2019  . Venous insufficiency of both lower extremities 05/02/2019  . Hyperlipidemia associated with type 2 diabetes mellitus ([place]) 04/26/2019  . Osteomyelitis of third toe of left foot ([place]) 04/17/2019  . Osteomyelitis of second toe of left foot  ([place]) 04/17/2019  . Bilateral primary osteoarthritis of knee 04/17/2019  . PCP NOTES >>>>>>>>>>>>>>> 03/01/2019  . Aortic stenosis 12/18/2018  . Iron deficiency anemia 09/09/2018  . S/P colostomy ([place]) 09/05/2016  . Vitamin D deficiency 04/26/2016  . Chronic renal failure in pediatric patient, stage 4 (severe) ([place]) 04/09/2015  . Diabetes mellitus type 2 with complications ([place]) 0000000  . Essential hypertension 04/08/2015  . Hyponatremia 04/08/2015  . Hyperkalemia 05/22/2012   Past Medical History:  Diagnosis Date  . Anemia   . Anesthesia complication    General anesthesia makes me feel foggy for long periods of time  . Arthritis   . CKD (chronic kidney disease) stage V requiring chronic dialysis ([place])    Stage 4-5  . Complication of anesthesia    slow to come out of anesthesia - a lot of confusion   . Diabetes ([employer])   . Fournier gangrene   . GI bleed   . Heart murmur    Born with, no problems  . History of blood transfusion    x2  . Hyperlipidemia   . Hypertension   . Moderate aortic stenosis   . NSTEMI (non-ST elevated myocardial infarction) ([place])   . Obesity   . OSA on CPAP   . Osteomyelitis ([place]) 2016   L toe  . Pulmonary hypertension ([place])   . Renal atrophy, left   . Vitamin D deficiency     Family History  Problem Relation Age of Onset  . CAD Father 39  . CAD Other        2 uncles had heart attacks  . Colon cancer Neg Hx   . Prostate cancer Neg Hx     Past Surgical History:  Procedure Laterality Date  . AMPUTATION TOE Left 04/20/2019   Procedure: AMPUTATION LEFT SECOND AND THIRD TOES;  Surgeon: Newt Minion, MD;  Location: [facility];  Service: Orthopedics;  Laterality: Left;  . AV FISTULA PLACEMENT Left 05/16/2019   Procedure: ARTERIOVENOUS (AV) FISTULA CREATION;  Surgeon: Serafina Mitchell, MD;  Location: [facility];  Service: Vascular;  Laterality: Left;  . BIOPSY  05/13/2019   Procedure: BIOPSY;  Surgeon: Laurence Spates, MD;  Location: [facility];  Service:  Endoscopy;;  . BIOPSY  05/29/2019   Procedure: BIOPSY;  Surgeon: Wonda Horner, MD;  Location: [facility] ENDOSCOPY;  Service: Endoscopy;;  . COLONOSCOPY    . COLOSTOMY  03/2015  . COLOSTOMY REVERSAL    . ENTEROSCOPY N/A 05/29/2019   Procedure: ENTEROSCOPY;  Surgeon: Wonda Horner, MD;  Location: [facility] ENDOSCOPY;  Service: Endoscopy;  Laterality: N/A;  . ESOPHAGOGASTRODUODENOSCOPY N/A 05/13/2019   Procedure: ESOPHAGOGASTRODUODENOSCOPY (EGD);  Surgeon: Laurence Spates, MD;  Location: [facility] ENDOSCOPY;  Service: Endoscopy;  Laterality: N/A;  . GIVENS CAPSULE STUDY N/A 05/27/2019   Procedure: GIVENS CAPSULE STUDY;  Surgeon: Wonda Horner, MD;  Location: [facility] ENDOSCOPY;  Service: Endoscopy;  Laterality: N/A;  . INSERTION OF DIALYSIS CATHETER Right 05/16/2019   Procedure: INSERTION OF DIALYSIS CATHETER;  Surgeon: Serafina Mitchell, MD;  [facility];  Service: Vascular;  Laterality: Right;  . IR THORACENTESIS ASP PLEURAL SPACE W/IMG GUIDE  05/15/2019  . TOE AMPUTATION Left 2016   Left big toe amputated due to MRSA infection   Social History   Occupational History  . Occupation: retired-owned a Environmental manager   . Occupation: retired- Theatre manager  Tobacco Use  . Smoking status: Former Research scientist (life sciences)  . Smokeless tobacco: Never Used  . Tobacco comment: quit 12/2018 (1 ppd)  Substance and Sexual Activity  . Alcohol use: Yes    Comment: social  . Drug use: Never  . Sexual activity: Not on file

## 2019-09-05 NOTE — Telephone Encounter (Signed)
Refill requests for furosemide and amlodipine. Neither on med list. Please advise.

## 2019-09-05 NOTE — Telephone Encounter (Signed)
Please call the patient.   Advised that both medicines were stopped on 05/18/2019 by another doctor. When I saw him 06/08/2019 he was not on those medications. Recommend to ask nephrology if he should be taking them.

## 2019-09-06 NOTE — Telephone Encounter (Signed)
LMOM informing Pt to return call.  

## 2019-09-13 ENCOUNTER — Other Ambulatory Visit: Payer: Self-pay | Admitting: Family

## 2019-09-17 ENCOUNTER — Other Ambulatory Visit: Payer: Self-pay

## 2019-09-21 ENCOUNTER — Ambulatory Visit: Payer: Medicare Other | Admitting: Internal Medicine

## 2019-09-24 ENCOUNTER — Ambulatory Visit: Payer: Medicare Other | Admitting: Orthopedic Surgery

## 2019-09-25 ENCOUNTER — Other Ambulatory Visit: Payer: Self-pay | Admitting: Internal Medicine

## 2019-10-06 ENCOUNTER — Other Ambulatory Visit: Payer: Self-pay | Admitting: Internal Medicine

## 2019-10-18 ENCOUNTER — Encounter: Payer: Self-pay | Admitting: Internal Medicine

## 2019-10-18 ENCOUNTER — Other Ambulatory Visit: Payer: Self-pay

## 2019-10-19 ENCOUNTER — Other Ambulatory Visit: Payer: Self-pay

## 2019-10-19 ENCOUNTER — Ambulatory Visit (INDEPENDENT_AMBULATORY_CARE_PROVIDER_SITE_OTHER): Payer: Medicare Other | Admitting: Internal Medicine

## 2019-10-19 ENCOUNTER — Encounter: Payer: Self-pay | Admitting: Internal Medicine

## 2019-10-19 VITALS — BP 138/80 | HR 72 | Temp 97.7°F | Resp 18 | Ht 75.0 in | Wt 287.0 lb

## 2019-10-19 DIAGNOSIS — I2510 Atherosclerotic heart disease of native coronary artery without angina pectoris: Secondary | ICD-10-CM | POA: Diagnosis not present

## 2019-10-19 DIAGNOSIS — I10 Essential (primary) hypertension: Secondary | ICD-10-CM

## 2019-10-19 DIAGNOSIS — I214 Non-ST elevation (NSTEMI) myocardial infarction: Secondary | ICD-10-CM | POA: Diagnosis not present

## 2019-10-19 DIAGNOSIS — E118 Type 2 diabetes mellitus with unspecified complications: Secondary | ICD-10-CM

## 2019-10-19 NOTE — Progress Notes (Signed)
Subjective:    Patient ID: Louis Bradley, male    DOB: May 14, 1946, 73 y.o.   MRN: [id number]  DOS:  10/19/2019 Type of visit - description: Follow-up Since the last visit 05-2019, he is doing well. Good compliance with his hemodialysis Had some foot problems, took antibiotics temporarily, now everything is healing well. DM: Ambulatory CBGs 115, 120  Review of Systems Denies chest pain or difficulty breathing No nausea, vomiting, diarrhea.  No blood in the stools   Past Medical History:  Diagnosis Date  . Anemia   . Anesthesia complication    General anesthesia makes me feel foggy for long periods of time  . Arthritis   . CKD (chronic kidney disease) stage V requiring chronic dialysis ([place])    Stage 4-5  . Diabetes ([place])   . Fournier gangrene   . GI bleed   . Heart murmur    Born with, no problems  . History of blood transfusion    x2  . Hyperlipidemia   . Hypertension   . Moderate aortic stenosis   . NSTEMI (non-ST elevated myocardial infarction) ([place])   . Obesity   . OSA on CPAP   . Osteomyelitis ([place]) 2016   L toe  . Pulmonary hypertension ([place])   . Renal atrophy, left   . Vitamin D deficiency     Past Surgical History:  Procedure Laterality Date  . AMPUTATION TOE Left 04/20/2019   Procedure: AMPUTATION LEFT SECOND AND THIRD TOES;  Surgeon: Newt Minion, MD;  Location: [facility];  Service: Orthopedics;  Laterality: Left;  . AV FISTULA PLACEMENT Left 05/16/2019   Procedure: ARTERIOVENOUS (AV) FISTULA CREATION;  Surgeon: Serafina Mitchell, MD;  Location: [facility];  Service: Vascular;  Laterality: Left;  . BIOPSY  05/13/2019   Procedure: BIOPSY;  Surgeon: Laurence Spates, MD;  Location: [facility];  Service: Endoscopy;;  . BIOPSY  05/29/2019   Procedure: BIOPSY;  Surgeon: Wonda Horner, MD;  Location[facility];  Service: Endoscopy;;  . COLONOSCOPY    . COLOSTOMY  03/2015  . COLOSTOMY REVERSAL    . ENTEROSCOPY N/A 05/29/2019   Procedure: ENTEROSCOPY;  Surgeon: Wonda Horner, MD;  Location: [facility] ENDOSCOPY;  Service: Endoscopy;  Laterality: N/A;  . ESOPHAGOGASTRODUODENOSCOPY N/A 05/13/2019   Procedure: ESOPHAGOGASTRODUODENOSCOPY (EGD);  Surgeon: Laurence Spates, MD;  Location: [facility] ENDOSCOPY;  Service: Endoscopy;  Laterality: N/A;  . GIVENS CAPSULE STUDY N/A 05/27/2019   Procedure: GIVENS CAPSULE STUDY;  Surgeon: Wonda Horner, MD;  Location: [facility] ENDOSCOPY;  Service: Endoscopy;  Laterality: N/A;  . INSERTION OF DIALYSIS CATHETER Right 05/16/2019   Procedure: INSERTION OF DIALYSIS CATHETER;  Surgeon: Serafina Mitchell, MD;  [facility];  Service: Vascular;  Laterality: Right;  . IR THORACENTESIS ASP PLEURAL SPACE W/IMG GUIDE  05/15/2019  . TOE AMPUTATION Left 2016   Left big toe amputated due to MRSA infection    Social History   Socioeconomic History  . Marital status: Married    Spouse name: Not on file  . Number of children: 2  . Years of education: Not on file  . Highest education level: Not on file  Occupational History  . Occupation: retired-owned a Environmental manager   . Occupation: retired- Theatre manager  Social Needs  . Financial resource strain: Not on file  . Food insecurity    Worry: Not on file    Inability: Not on file  . Transportation needs    Medical: Not on file  Non-medical: Not on file  Tobacco Use  . Smoking status: Former Research scientist (life sciences)  . Smokeless tobacco: Never Used  . Tobacco comment: quit 12/2018 (1 ppd)  Substance and Sexual Activity  . Alcohol use: Yes    Comment: social  . Drug use: Never  . Sexual activity: Not on file  Lifestyle  . Physical activity    Days per week: Not on file    Minutes per session: Not on file  . Stress: Not on file  Relationships  . Social Herbalist on phone: Not on file    Gets together: Not on file    Attends religious service: Not on file    Active member of club or organization: Not on file    Attends meetings of clubs or organizations: Not on file    Relationship status: Not on  file  . Intimate partner violence    Fear of current or ex partner: Not on file    Emotionally abused: Not on file    Physically abused: Not on file    Forced sexual activity: Not on file  Other Topics Concern  . Not on file  Social History Narrative   Patient and his wife moved from [place] on 02/14/2021 live with his daughter      Allergies as of 10/19/2019   No Known Allergies     Medication List       Accurate as of October 19, 2019 11:59 PM. If you have any questions, ask your nurse or doctor.        STOP taking these medications   doxycycline 100 MG tablet Commonly known as: VIBRA-TABS Stopped by: Kathlene November, MD     TAKE these medications   calcitRIOL 0.25 MCG capsule Commonly known as: ROCALTROL Take 0.25 mcg by mouth every Monday, Wednesday, and Friday.   calcium acetate 667 MG capsule Commonly known as: PHOSLO Take 1 capsule (667 mg total) by mouth 3 (three) times daily with meals.   carvedilol 3.125 MG tablet Commonly known as: COREG Take 1 tablet (3.125 mg total) by mouth 2 (two) times daily with a meal.   Diclofenac Sodium 1 % Crea Place 1 application onto the skin 2 (two) times daily as needed. What changed: reasons to take this   glipiZIDE 5 MG tablet Commonly known as: GLUCOTROL Take 2 tablets (10 mg total) by mouth 2 (two) times daily.   nicotine polacrilex 4 MG lozenge Commonly known as: COMMIT Take 4 mg by mouth as needed for smoking cessation.   pantoprazole 40 MG tablet Commonly known as: PROTONIX Take 1 tablet (40 mg total) by mouth 2 (two) times daily before a meal.   pravastatin 40 MG tablet Commonly known as: PRAVACHOL Take 1 tablet (40 mg total) by mouth at bedtime.   PROBIOTIC DAILY PO Take 1 tablet by mouth every Monday, Wednesday, and Friday.           Objective:   Physical Exam BP 138/80 (BP Location: Right Arm, Patient Position: Sitting, Cuff Size: Normal)   Pulse 72   Temp 97.7 F (36.5 C) (Temporal)   Resp 18   Ht  6\' 3"  (1.905 m)   Wt 287 lb (130.2 kg)   SpO2 100%   BMI 35.87 kg/m  General:   Well developed, NAD, BMI noted. HEENT:  Normocephalic . Face symmetric, atraumatic Lungs:  CTA B Normal respiratory effort, no intercostal retractions, no accessory muscle use. Heart: RRR, significant systolic murmur.  No pretibial edema  bilaterally  Skin: Not pale. Not jaundice Neurologic:  alert & oriented X3.  Speech normal, gait: Not tested, on a scooter. Psych--  Cognition and judgment appear intact.  Cooperative with normal attention span and concentration.  Behavior appropriate. No anxious or depressed appearing.      Assessment    Assessment (new patient/2020, referred by his daughter) DM HTN Hyperlipidemia NSTEMI 05/2019 CKD: HD fistula planned ~ 11/2018, cancelled.  Currently on dialysis, a transplant candidate Vitamin D deficiency Aortic valve stenosis: Next echo 2020 OSA, on CPAP H/o osteomyelitis status post left toe amputation H/o gangrene, buttocks, had temporarily a colostomy Former smoker, quit 12-2018 Anemia:  -Seen at the ER with fatigue, hemoglobin of 8.6 08-2018: 2 PRBCs, had a EGD and colonoscopy. -Admitted7/08/2019, hemoglobin 6.2, 7 PRBCs.  Capsule endoscopy, + duodenal mass, status post small bowel endoscopy, see D/C  summary.   PLAN: DM: Currently onGlipizide, ambulatory CBGs 115, 120.  Check A1c HTN: Currently on carvedilol, BP today is very good. Hyperlipidemia: Last LDL 32, on Pravachol.  No change Non-STEMI: Asymptomatic, has not seen cardiology, referral sent. History of anemia, extensive work-up done recently, currently asymptomatic, was told to return to GI as needed. Preventive care: Had a flu shot already Recommend PNM 23 booster, will get at the pharmacy. RTC 6 months   This visit occurred during the SARS-CoV-2 public health emergency.  Safety protocols were in place, including screening questions prior to the visit, additional usage of staff PPE, and  extensive cleaning of exam room while observing appropriate contact time as indicated for disinfecting solutions.

## 2019-10-19 NOTE — Progress Notes (Signed)
Pre visit review using our clinic review tool, if applicable. No additional management support is needed unless otherwise documented below in the visit note. 

## 2019-10-19 NOTE — Patient Instructions (Addendum)
Please schedule Medicare Wellness with Glenard Haring.   Per our records you are due for an eye exam. Please contact your eye doctor to schedule an appointment. Please have them send copies of your office visit notes to [place]. Our fax number is [phone].    GO TO THE FRONT DESK Schedule your next appointment  For a check up in 6 months    Get a Pneumonia shot 23 at the pharmacy

## 2019-10-20 DIAGNOSIS — I2510 Atherosclerotic heart disease of native coronary artery without angina pectoris: Secondary | ICD-10-CM | POA: Insufficient documentation

## 2019-10-20 NOTE — Assessment & Plan Note (Signed)
DM: Currently onGlipizide, ambulatory CBGs 115, 120.  Check A1c HTN: Currently on carvedilol, BP today is very good. Hyperlipidemia: Last LDL 32, on Pravachol.  No change Non-STEMI: Asymptomatic, has not seen cardiology, referral sent. History of anemia, extensive work-up done recently, currently asymptomatic, was told to return to GI as needed. Preventive care: Had a flu shot already Recommend PNM 23 booster, will get at the pharmacy. RTC 6 months

## 2019-10-22 ENCOUNTER — Other Ambulatory Visit: Payer: Self-pay

## 2019-10-22 ENCOUNTER — Encounter: Payer: Self-pay | Admitting: Orthopedic Surgery

## 2019-10-22 ENCOUNTER — Telehealth: Payer: Self-pay | Admitting: Cardiology

## 2019-10-22 ENCOUNTER — Ambulatory Visit (INDEPENDENT_AMBULATORY_CARE_PROVIDER_SITE_OTHER): Payer: Medicare Other | Admitting: Orthopedic Surgery

## 2019-10-22 VITALS — Ht 75.0 in | Wt 287.0 lb

## 2019-10-22 DIAGNOSIS — M25561 Pain in right knee: Secondary | ICD-10-CM

## 2019-10-22 DIAGNOSIS — G8929 Other chronic pain: Secondary | ICD-10-CM | POA: Diagnosis not present

## 2019-10-22 DIAGNOSIS — M25562 Pain in left knee: Secondary | ICD-10-CM

## 2019-10-22 DIAGNOSIS — M1712 Unilateral primary osteoarthritis, left knee: Secondary | ICD-10-CM

## 2019-10-22 DIAGNOSIS — M1711 Unilateral primary osteoarthritis, right knee: Secondary | ICD-10-CM | POA: Diagnosis not present

## 2019-10-22 LAB — HEMOGLOBIN A1C
Hgb A1c MFr Bld: 6.5 % of total Hgb — ABNORMAL HIGH (ref <5.7)
Mean Plasma Glucose: 140 (calc)
eAG (mmol/L): 7.7 (calc)

## 2019-10-22 LAB — CBC WITH DIFFERENTIAL/PLATELET
Absolute Monocytes: 426 cells/uL (ref 200–950)
Basophils Absolute: 94 cells/uL (ref 0–200)
Basophils Relative: 0.9 %
Eosinophils Absolute: 322 cells/uL (ref 15–500)
Eosinophils Relative: 3.1 %
HCT: 29.3 % — ABNORMAL LOW (ref 38.5–50.0)
Hemoglobin: 9.4 g/dL — ABNORMAL LOW (ref 13.2–17.1)
Lymphs Abs: 1591 cells/uL (ref 850–3900)
MCH: 28.6 pg (ref 27.0–33.0)
MCHC: 32.1 g/dL (ref 32.0–36.0)
MCV: 89.1 fL (ref 80.0–100.0)
MPV: 13.1 fL — ABNORMAL HIGH (ref 7.5–12.5)
Monocytes Relative: 4.1 %
Neutro Abs: 7966 cells/uL — ABNORMAL HIGH (ref 1500–7800)
Neutrophils Relative %: 76.6 %
Platelets: 220 10*3/uL (ref 140–400)
RBC: 3.29 10*6/uL — ABNORMAL LOW (ref 4.20–5.80)
RDW: 17.9 % — ABNORMAL HIGH (ref 11.0–15.0)
Total Lymphocyte: 15.3 %
WBC: 10.4 10*3/uL (ref 3.8–10.8)

## 2019-10-22 LAB — BASIC METABOLIC PANEL

## 2019-10-22 NOTE — Telephone Encounter (Signed)
Patient is requesting to switch cardiologists from Dr. Marlou Porch to Dr. Burt Knack.

## 2019-10-22 NOTE — Telephone Encounter (Signed)
This is fine with me if ok'd by Dr Marlou Porch. I've only seen him when he was hospitalized.

## 2019-10-22 NOTE — Addendum Note (Signed)
Addended byDamita Dunnings D on: 10/22/2019 08:06 AM   Modules accepted: Orders

## 2019-10-22 NOTE — Telephone Encounter (Signed)
Attempted to schedule patient for visit with Dr. Burt Knack 12/17 but he declined. He is T-TH-S dialysis and will need appointment [facility].  Informed him he will be called if Dr. Burt Knack adds in clinic or has an opening.  He was grateful for assistance.

## 2019-10-22 NOTE — Telephone Encounter (Signed)
Fine with me as well Candee Furbish, MD

## 2019-10-22 NOTE — Progress Notes (Signed)
Office Visit Note   Patient: Louis Bradley           Date of Birth: [birth year]           MRN: [id number] Visit Date: 10/22/2019              Requested by: Colon Branch, [address] [place],  [place] [address] PCP: Colon Branch, MD  Chief Complaint  Patient presents with  . Right Knee - Follow-up, Pain  . Left Knee - Follow-up, Pain      HPI: This is a pleasant gentleman with a history of bilateral knee arthritis.  He has just started dialysis and was told not to have any cortisone injections he presents today for gel 1 injections of his bilateral knees  Assessment & Plan: Visit Diagnoses: No diagnosis found.  Plan: He may follow-up as needed we discussed moving his knees not allowing them to get stiff  Follow-Up Instructions: No follow-ups on file.   Ortho Exam  Patient is alert, oriented, no adenopathy, well-dressed, normal affect, normal respiratory effort. Bilateral knees: Distal CMS is intact there is no effusion or erythema no significant swelling tender around the medial joint lines bilaterally  Imaging: No results found. No images are attached to the encounter.  Labs: Lab Results  Component Value Date   HGBA1C 6.5 (H) 10/19/2019   HGBA1C 5.9 (H) 05/11/2019   REPTSTATUS 06/25/2019 FINAL 06/20/2019   GRAMSTAIN  05/15/2019    WBC PRESENT,BOTH PMN AND MONONUCLEAR NO ORGANISMS SEEN CYTOSPIN SMEAR Performed at [facility], [place] [address] [place], [place] [address]    CULT  06/20/2019    NO GROWTH 5 DAYS Performed at [facility] [address] [place],  [address]      Lab Results  Component Value Date   ALBUMIN 3.4 (L) 06/20/2019   ALBUMIN 2.7 (L) 05/30/2019   ALBUMIN 3.1 (L) 05/25/2019    Lab Results  Component Value Date   MG 2.1 05/18/2019   MG 2.2 05/17/2019   MG 3.3 (H) 05/16/2019   No results found for: VD25OH  No results found for: PREALBUMIN CBC EXTENDED Latest Ref Rng & Units 10/19/2019 06/20/2019  06/01/2019  WBC 3.8 - 10.8 Thousand/uL 10.4 19.6(H) 7.4  RBC 4.20 - 5.80 Million/uL 3.29(L) 3.66(L) 2.59(L)  HGB 13.2 - 17.1 g/dL 9.4(L) 10.5(L) 7.7(L)  HCT 38.5 - 50.0 % 29.3(L) 33.5(L) 24.8(L)  PLT 140 - 400 Thousand/uL 220 171 159  NEUTROABS 1,500 - 7,800 cells/uL 7,966(H) 18.3(H) -  LYMPHSABS 850 - 3,900 cells/uL 1,591 0.2(L) -     Body mass index is 35.87 kg/m.  Orders:  No orders of the defined types were placed in this encounter.  No orders of the defined types were placed in this encounter.    Procedures: Large Joint Inj: bilateral knee on 10/22/2019 1:55 PM Indications: pain Details: 18 G 1.5 in needle, anteromedial approach  Arthrogram: No  Outcome: tolerated well, no immediate complications Procedure, treatment alternatives, risks and benefits explained, specific risks discussed. Consent was given by the patient. Patient was prepped and draped in the usual sterile fashion.      Clinical Data: No additional findings.  ROS:  All other systems negative, except as noted in the HPI. Review of Systems  Objective: Vital Signs: Ht 6\' 3"  (1.905 m)   Wt 287 lb (130.2 kg)   BMI 35.87 kg/m   Specialty Comments:  No specialty comments available.  PMFS History: Patient Active Problem List   Diagnosis Date Noted  . CAD (coronary artery disease) 10/20/2019  . ESRD on dialysis ([place]) 05/25/2019  . Non-ST elevation (NSTEMI) myocardial infarction ([place]) 05/21/2019  . Atrial fibrillation ([place]) 05/14/2019  . Volume overload 05/14/2019  . Pulmonary hypertension ([place]) 05/14/2019  . Acute respiratory failure ([place]) 05/12/2019  . GI bleed 05/10/2019  . Acute blood loss anemia 05/10/2019  . Amputated toe, left ([place]) 05/02/2019  . Onychomycosis 05/02/2019  . Venous insufficiency of both lower extremities 05/02/2019  . Hyperlipidemia associated with type 2 diabetes mellitus ([place]) 04/26/2019  . Osteomyelitis of third toe of left foot ([place]) 04/17/2019  . Osteomyelitis of  second toe of left foot ([place]) 04/17/2019  . Bilateral primary osteoarthritis of knee 04/17/2019  . PCP NOTES >>>>>>>>>>>>>>> 03/01/2019  . Aortic stenosis 12/18/2018  . Iron deficiency anemia 09/09/2018  . S/P colostomy ([place]) 09/05/2016  . Vitamin D deficiency 04/26/2016  . Chronic renal failure in pediatric patient, stage 4 (severe) ([place]) 04/09/2015  . Diabetes mellitus type 2 with complications ([place]) 0000000  . Essential hypertension 04/08/2015  . Hyponatremia 04/08/2015  . Hyperkalemia 05/22/2012   Past Medical History:  Diagnosis Date  . Anemia   . Anesthesia complication    General anesthesia makes me feel foggy for long periods of time  . Arthritis   . CKD (chronic kidney disease) stage V requiring chronic dialysis ([place])    Stage 4-5  . Diabetes ([place])   . Fournier gangrene   . GI bleed   . Heart murmur    Born with, no problems  . History of blood transfusion    x2  . Hyperlipidemia   . Hypertension   . Moderate aortic stenosis   . NSTEMI (non-ST elevated myocardial infarction) ([place])   . Obesity   . OSA on CPAP   . Osteomyelitis ([place]) 2016   L toe  . Pulmonary hypertension ([place])   . Renal atrophy, left   . Vitamin D deficiency     Family History  Problem Relation Age of Onset  . CAD Father 84  . CAD Other        2 uncles had heart attacks  . Colon cancer Neg Hx   . Prostate cancer Neg Hx     Past Surgical History:  Procedure Laterality Date  . AMPUTATION TOE Left 04/20/2019   Procedure: AMPUTATION LEFT SECOND AND THIRD TOES;  Surgeon: Newt Minion, MD;  Location: [facility];  Service: Orthopedics;  Laterality: Left;  . AV FISTULA PLACEMENT Left 05/16/2019   Procedure: ARTERIOVENOUS (AV) FISTULA CREATION;  Surgeon: Serafina Mitchell, MD;  Location: [facility];  Service: Vascular;  Laterality: Left;  . BIOPSY  05/13/2019   Procedure: BIOPSY;  Surgeon: Laurence Spates, MD;  Location: [facility];  Service: Endoscopy;;  . BIOPSY  05/29/2019   Procedure: BIOPSY;   Surgeon: Wonda Horner, MD;  Location: [facility] ENDOSCOPY;  Service: Endoscopy;;  . COLONOSCOPY    . COLOSTOMY  03/2015  . COLOSTOMY REVERSAL    . ENTEROSCOPY N/A 05/29/2019   Procedure: ENTEROSCOPY;  Surgeon: Wonda Horner, MD;  Location: [facility] ENDOSCOPY;  Service: Endoscopy;  Laterality: N/A;  . ESOPHAGOGASTRODUODENOSCOPY N/A 05/13/2019   Procedure: ESOPHAGOGASTRODUODENOSCOPY (EGD);  Surgeon: Laurence Spates, MD;  Location: [facility] ENDOSCOPY;  Service: Endoscopy;  Laterality: N/A;  . GIVENS CAPSULE STUDY N/A 05/27/2019   Procedure: GIVENS CAPSULE STUDY;  Surgeon: Wonda Horner, MD;  Location: [facility] ENDOSCOPY;  Service: Endoscopy;  Laterality: N/A;  .  INSERTION OF DIALYSIS CATHETER Right 05/16/2019   Procedure: INSERTION OF DIALYSIS CATHETER;  Surgeon: Serafina Mitchell, MD;  Location: [facility] OR;  Service: Vascular;  Laterality: Right;  . IR THORACENTESIS ASP PLEURAL SPACE W/IMG GUIDE  05/15/2019  . TOE AMPUTATION Left 2016   Left big toe amputated due to MRSA infection   Social History   Occupational History  . Occupation: retired-owned a Environmental manager   . Occupation: retired- Theatre manager  Tobacco Use  . Smoking status: Former Research scientist (life sciences)  . Smokeless tobacco: Never Used  . Tobacco comment: quit 12/2018 (1 ppd)  Substance and Sexual Activity  . Alcohol use: Yes    Comment: social  . Drug use: Never  . Sexual activity: Not on file

## 2019-11-02 NOTE — Telephone Encounter (Signed)
Spoke with the patient and spouse in great detail after speaking with Dr. Burt Knack and management team. Because of schedule access, the patient and his wife will need to establish with general cardiologist. Scheduled both the patient and his wife with Dr. Marisue Ivan 1/15. They were grateful for call and agrees with treatment plan.

## 2019-11-04 ENCOUNTER — Telehealth: Payer: Self-pay | Admitting: Family Medicine

## 2019-11-05 NOTE — Telephone Encounter (Signed)
Very sad, I'll wait for the death certificate

## 2019-11-06 NOTE — Telephone Encounter (Signed)
Copy of death certificate sent for scanning.

## 2019-11-06 NOTE — Telephone Encounter (Signed)
Death certificate signed. Cardiac arrest due to CAD, end-stage renal disease

## 2019-11-16 NOTE — Telephone Encounter (Signed)
Received call from [facility] regional ER provider about patient passing away in ER.  Wife saw him become unresponsive this morning while seated in his wheelchair. EMS called and CPR initiated by family, ACLS continued at ER with evidence of Vtach and Vfib, >1hr unsuccessful attempt. Pronounced at [id number].  Death certificate will be forwarded to PCP's office on Monday.

## 2019-11-16 DEATH — deceased

## 2019-11-30 ENCOUNTER — Ambulatory Visit: Payer: Medicare Other | Admitting: Cardiovascular Disease

## 2019-12-06 ENCOUNTER — Other Ambulatory Visit: Payer: Self-pay | Admitting: Internal Medicine

## 2019-12-19 ENCOUNTER — Other Ambulatory Visit: Payer: Self-pay | Admitting: Internal Medicine

## 2021-03-18 IMAGING — DX LEFT FOOT - COMPLETE 3+ VIEW
3 series · 3 of 3 positions shown · non-contrast
Comparison: None.

CLINICAL DATA: Toe infection.

EXAM:
LEFT FOOT - COMPLETE 3+ VIEW

[foot ap]
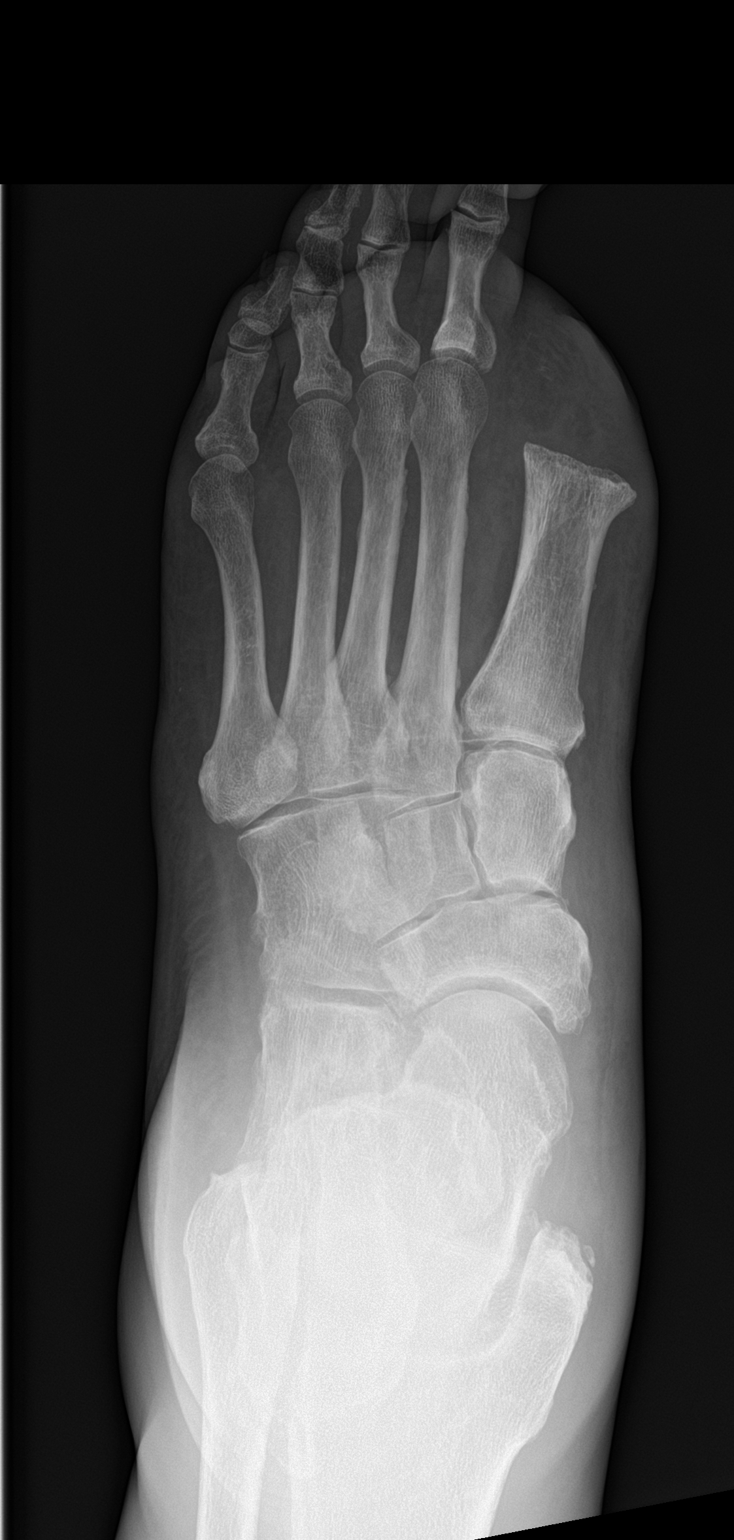

[foot obl]
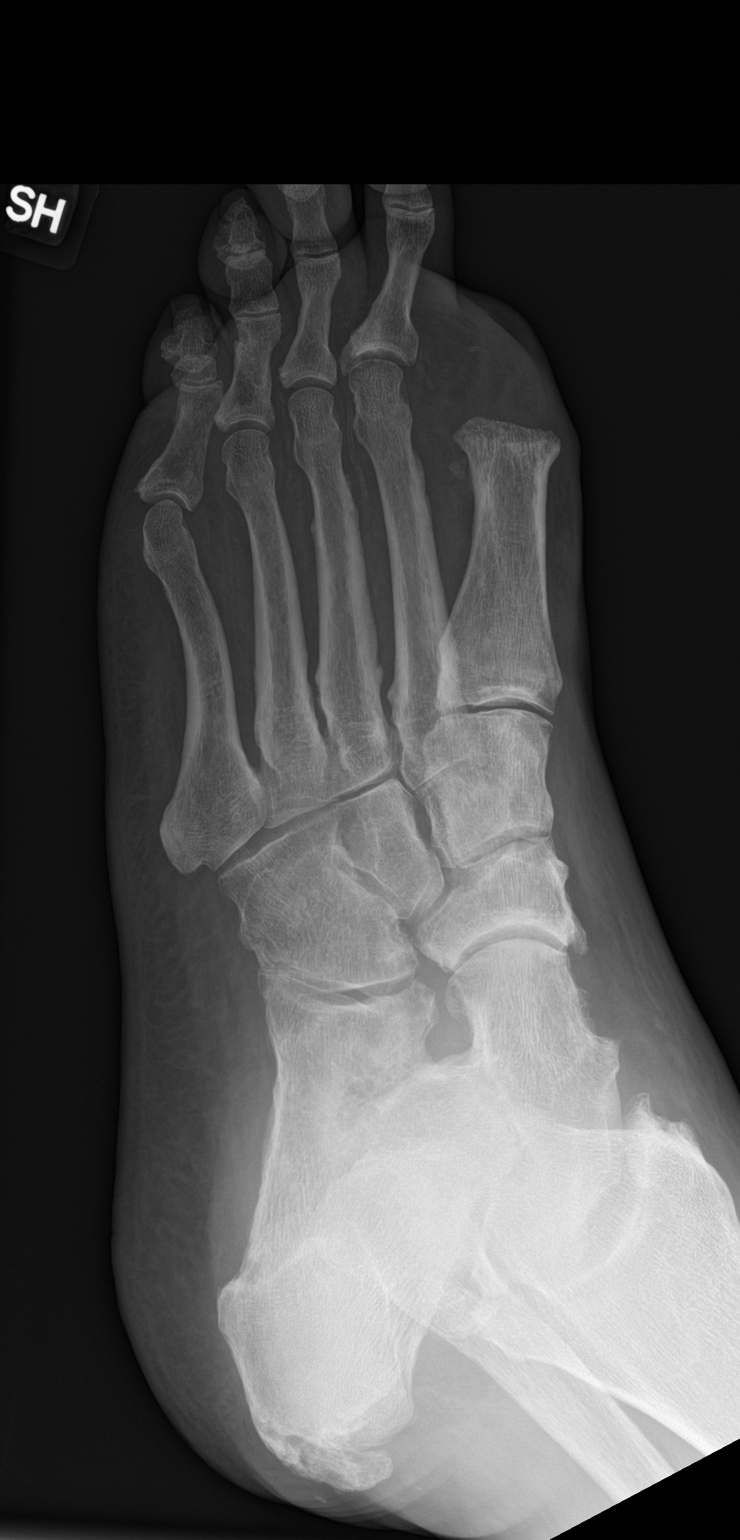

[foot lat]
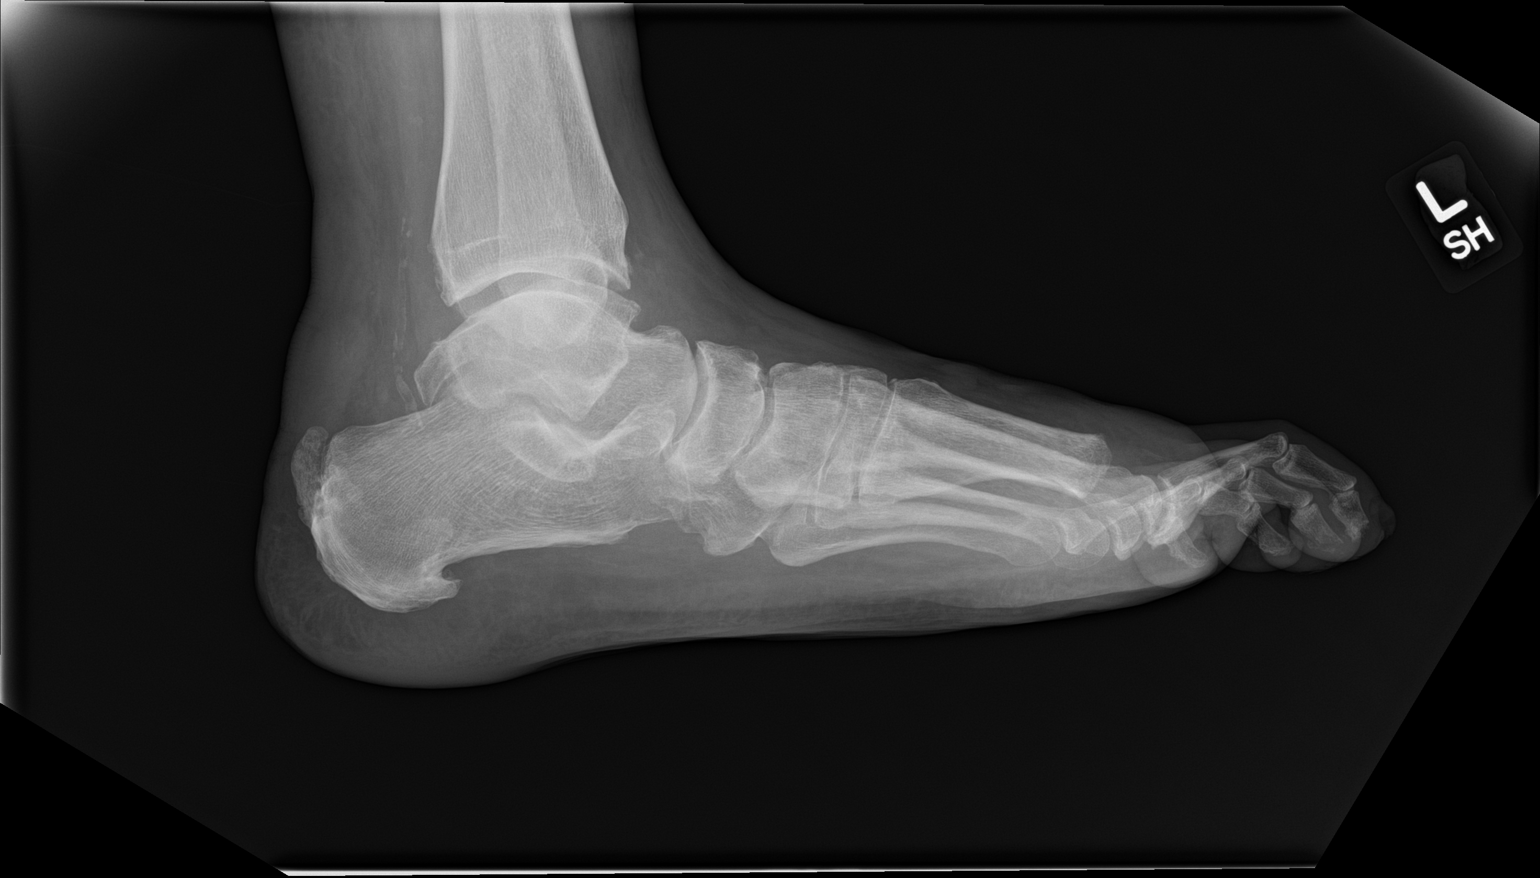

[3 of 3 positions shown; findings below may reference images not displayed]

FINDINGS: Status post surgical amputation of distal portion of first
metatarsal and phalanges. Lytic destruction is seen involving the
distal portions of the second and third distal phalanges suggesting
osteomyelitis. Moderate posterior calcaneal spurring is noted.
IMPRESSION: Lytic destruction is seen involving the distal [REDACTED] of the second
and third distal phalanges suggesting osteomyelitis. Status post
surgical amputation of first toe.

## 2021-04-17 IMAGING — DX PORTABLE CHEST - 1 VIEW
1 series · 1 of 1 positions shown · non-contrast
Comparison: May 10, 2019

CLINICAL DATA: Cough

EXAM:
PORTABLE CHEST 1 VIEW

[chest ap]
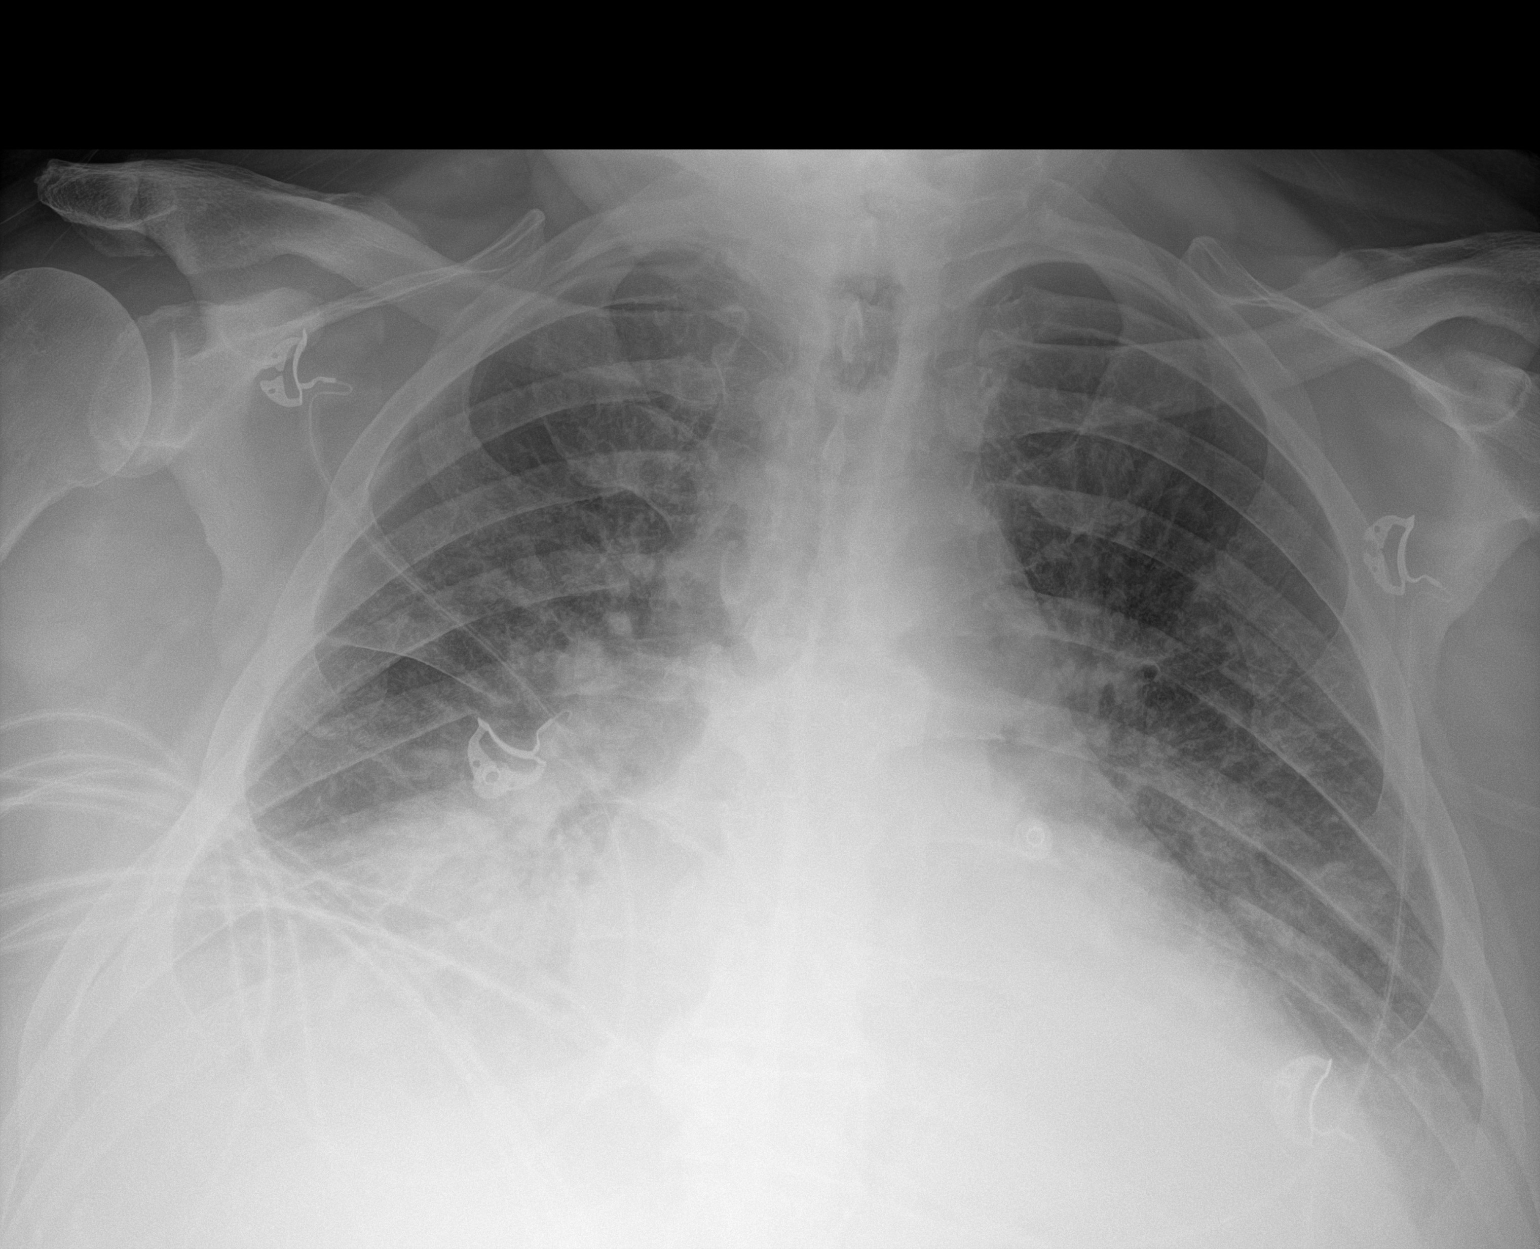

[1 of 1 positions shown; findings below may reference images not displayed]

FINDINGS: There is cardiomegaly with pulmonary vascularity within normal
limits. There are small pleural effusions bilaterally. There is
consolidation in each lung base. Lungs elsewhere clear. No
adenopathy. No bone lesions.
IMPRESSION: Bibasilar airspace consolidation, most likely representing
multifocal pneumonia. Small pleural effusions bilaterally. No
evident interstitial edema. There is cardiomegaly.

## 2021-04-21 IMAGING — RF CENTRAL VENOUS CATHETER WITH FLUOROSCOPY
1 series · 1 of 1 positions shown · non-contrast
Comparison: none

CLINICAL DATA: End-stage renal disease, catheter placement

PROCEDURE:
CENTRAL VENOUS CATHETER WITH FLUOROSCOPY

[Series 1: unknown protocol · 0.20mm/px · 1 of 1 slices shown]
[im 1/1]
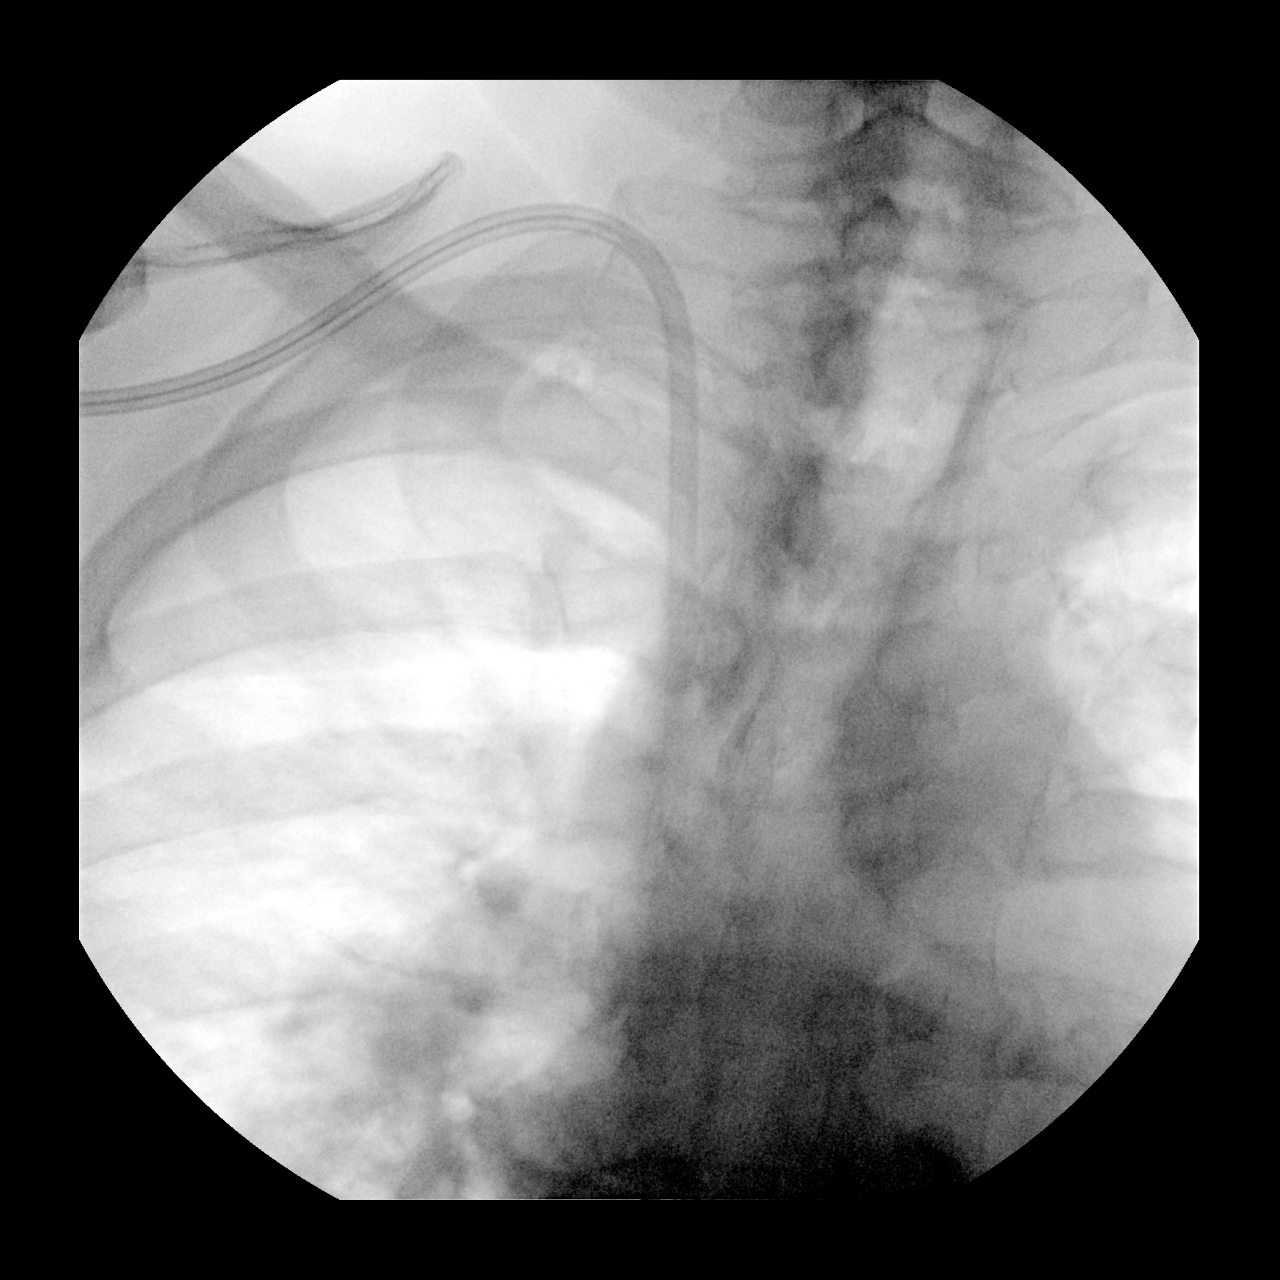

[1 of 1 positions shown; findings below may reference images not displayed]

FINDINGS: Single intraoperative fluoroscopic spot image documents tunneled
right IJ hemodialysis catheter to the mid right atrium. Motion
degrades pulmonary parenchymal evaluation.

FLUOROSCOPY TIME:  See procedure report
IMPRESSION: Right IJ tunneled hemodialysis catheter to the mid right atrium.
